# Patient Record
Sex: Male | Born: 1940
Health system: Southern US, Community
[De-identification: ages and names within clinical notes are randomized; demographics above are authoritative.]

## PROBLEM LIST (undated history)

## (undated) DIAGNOSIS — I739 Peripheral vascular disease, unspecified: Secondary | ICD-10-CM

## (undated) DIAGNOSIS — J449 Chronic obstructive pulmonary disease, unspecified: Secondary | ICD-10-CM

## (undated) DIAGNOSIS — I6529 Occlusion and stenosis of unspecified carotid artery: Secondary | ICD-10-CM

## (undated) DIAGNOSIS — I639 Cerebral infarction, unspecified: Secondary | ICD-10-CM

## (undated) DIAGNOSIS — I10 Essential (primary) hypertension: Secondary | ICD-10-CM

## (undated) HISTORY — DX: Chronic obstructive pulmonary disease, unspecified: J44.9

## (undated) HISTORY — DX: Cerebral infarction, unspecified: I63.9

## (undated) HISTORY — DX: Occlusion and stenosis of unspecified carotid artery: I65.29

## (undated) HISTORY — DX: Peripheral vascular disease, unspecified: I73.9

---

## 2003-02-19 ENCOUNTER — Ambulatory Visit (HOSPITAL_COMMUNITY): Admission: RE | Admit: 2003-02-19 | Discharge: 2003-02-19 | Payer: Self-pay | Admitting: Internal Medicine

## 2004-02-23 ENCOUNTER — Ambulatory Visit (HOSPITAL_COMMUNITY): Admission: RE | Admit: 2004-02-23 | Discharge: 2004-02-23 | Payer: Self-pay | Admitting: Internal Medicine

## 2005-02-25 ENCOUNTER — Ambulatory Visit (HOSPITAL_COMMUNITY): Admission: RE | Admit: 2005-02-25 | Discharge: 2005-02-25 | Payer: Self-pay | Admitting: Internal Medicine

## 2005-02-25 ENCOUNTER — Encounter: Admission: RE | Admit: 2005-02-25 | Discharge: 2005-02-25 | Payer: Self-pay | Admitting: Internal Medicine

## 2007-02-21 ENCOUNTER — Emergency Department (HOSPITAL_COMMUNITY): Admission: EM | Admit: 2007-02-21 | Discharge: 2007-02-21 | Payer: Self-pay | Admitting: Emergency Medicine

## 2007-02-23 ENCOUNTER — Emergency Department (HOSPITAL_COMMUNITY): Admission: EM | Admit: 2007-02-23 | Discharge: 2007-02-23 | Payer: Self-pay | Admitting: Emergency Medicine

## 2007-02-24 ENCOUNTER — Emergency Department (HOSPITAL_COMMUNITY): Admission: EM | Admit: 2007-02-24 | Discharge: 2007-02-24 | Payer: Self-pay | Admitting: Emergency Medicine

## 2007-03-12 ENCOUNTER — Ambulatory Visit (HOSPITAL_COMMUNITY): Admission: RE | Admit: 2007-03-12 | Discharge: 2007-03-12 | Payer: Self-pay | Admitting: Internal Medicine

## 2008-03-25 ENCOUNTER — Encounter: Payer: Self-pay | Admitting: Gastroenterology

## 2008-03-25 ENCOUNTER — Encounter (INDEPENDENT_AMBULATORY_CARE_PROVIDER_SITE_OTHER): Payer: Self-pay | Admitting: *Deleted

## 2008-04-20 ENCOUNTER — Ambulatory Visit: Payer: Self-pay | Admitting: Gastroenterology

## 2008-04-20 DIAGNOSIS — K921 Melena: Secondary | ICD-10-CM | POA: Insufficient documentation

## 2008-04-21 ENCOUNTER — Encounter: Payer: Self-pay | Admitting: Gastroenterology

## 2008-04-23 ENCOUNTER — Encounter: Payer: Self-pay | Admitting: Gastroenterology

## 2008-04-23 ENCOUNTER — Ambulatory Visit: Payer: Self-pay | Admitting: Gastroenterology

## 2008-04-23 ENCOUNTER — Ambulatory Visit (HOSPITAL_COMMUNITY): Admission: RE | Admit: 2008-04-23 | Discharge: 2008-04-23 | Payer: Self-pay | Admitting: Gastroenterology

## 2008-05-05 ENCOUNTER — Encounter: Payer: Self-pay | Admitting: Gastroenterology

## 2010-06-07 NOTE — Op Note (Signed)
Jerry Rodriguez, Jerry Rodriguez              ACCOUNT NO.:  [id number]   MEDICAL RECORD NO.:  [id number]          PATIENT TYPE:  AMB   LOCATION:  DAY                           FACILITY:  [facility]   PHYSICIAN:  Kassie Mends, M.D.      DATE OF BIRTH:  04/14/1940   DATE OF PROCEDURE:  04/23/2008  DATE OF DISCHARGE:                               OPERATIVE REPORT   REFERRING Aidenn Skellenger:  Kingsley Callander. Ouida Sills, MD   PROCEDURE:  Ileal colonoscopy with removal of foreign body and cold  forceps/snare cautery polypectomy.   INDICATION FOR EXAMINATION:  Mr. Casale is a 70 year old male who  presents with heme-positive stool.   He uses Advil every 3 weeks.   FINDINGS:  1. Normal terminal ileum, approximately 5 cm visualized.  2. Foreign body seen in the cecum.  Mr. Britten swallowed a soda tab      in December 2009.  The soda tab was grasped with a [employer].  The      scope was withdrawn into the rectum.  The position of the tab      prevented removal and the tab was released into the rectum.  The      hood was deployed over the scope.  The scope was reinserted.  The      soda tab was secured in a different orientation.  Its exit through      the anus was longitudinal and not horizontal.  The hood did not      deploy.  No trauma to the anal canal was seen.  3. Ten colon polyps removed via cold forceps and snare cautery      polypectomy.  The polyps ranged in size from 3 mm to 8 mm.  The      polyps were located throughout the transverse colon and a single      polyp was seen in the sigmoid colon.  The sigmoid colon polyp was      sent in a separate container.  Otherwise, no evidence of masses,      inflammatory changes, diverticular AVMs.  4. Small internal hemorrhoids.  Otherwise, normal retroflexed view of      the rectum.   DIAGNOSES:  1. Foreign body in the cecum, status post removal.  2. Multiple colon polyps, likely the source of his heme-positive      stools.  3. Small internal hemorrhoids.   RECOMMENDATIONS:  1. Screening colonoscopy in 5 years due to the number of polyps      removed.  2. We will call Mr. Schoffstall with the results of his biopsies.  3. No aspirin, NSAIDs, or anticoagulation for 7 days.  4. He should follow a high-fiber diet.  He is given a handout on high-      fiber diet, polyps, and hemorrhoids.   MEDICATIONS:  1. Demerol 50 mg IV.  2. Versed 4 mg IV.   PROCEDURE TECHNIQUE:  Physical exam was performed.  Informed consent was  obtained from the patient after explaining the benefits, risks, and  alternatives to the procedure.  The patient was connected to monitor  and  placed in left lateral position.  Continuous oxygen was provided by  nasal cannula.  IV medicine administered through an indwelling cannula.  After administration of sedation and rectal exam, the patient's rectum  was intubated.  The scope was advanced under direct visualization to the  distal terminal ileum.  In the cecum, there was a foreign body.  The  foreign body was removed via [employer].  After the foreign body was  removed, the rectum again was intubated with the adult colonoscope.  The  scope was advanced under direct visualization to the ascending colon.  The polyps were removed from the transverse colon and the sigmoid colon.  The scope was removed slowly by carefully examining the color, texture,  anatomy and integrity of the mucosa on the way out.  The patient was  recovered in endoscopy and discharged home in satisfactory condition.   PATH:  Simple adenomas and hyperplastic polyps.      Kassie Mends, M.D.  Electronically Signed     SM/MEDQ  D:  04/23/2008  T:  04/23/2008  Job:  [id number]   cc:   Kingsley Callander. Ouida Sills, MD  Fax: [phone]

## 2010-09-16 ENCOUNTER — Encounter: Payer: Self-pay | Admitting: *Deleted

## 2010-09-16 ENCOUNTER — Other Ambulatory Visit: Payer: Self-pay

## 2010-09-16 ENCOUNTER — Emergency Department (HOSPITAL_COMMUNITY): Payer: Medicare Other

## 2010-09-16 ENCOUNTER — Inpatient Hospital Stay (HOSPITAL_COMMUNITY): Payer: Medicare Other

## 2010-09-16 ENCOUNTER — Inpatient Hospital Stay (HOSPITAL_COMMUNITY)
Admission: EM | Admit: 2010-09-16 | Discharge: 2010-09-18 | DRG: 066 | Disposition: A | Discharge status: Home / Self Care | Payer: Medicare Other | Attending: Internal Medicine | Admitting: Internal Medicine

## 2010-09-16 DIAGNOSIS — I6350 Cerebral infarction due to unspecified occlusion or stenosis of unspecified cerebral artery: Principal | ICD-10-CM | POA: Diagnosis present

## 2010-09-16 DIAGNOSIS — E785 Hyperlipidemia, unspecified: Secondary | ICD-10-CM | POA: Diagnosis present

## 2010-09-16 DIAGNOSIS — R4789 Other speech disturbances: Secondary | ICD-10-CM | POA: Diagnosis present

## 2010-09-16 DIAGNOSIS — F17200 Nicotine dependence, unspecified, uncomplicated: Secondary | ICD-10-CM | POA: Diagnosis present

## 2010-09-16 DIAGNOSIS — J4489 Other specified chronic obstructive pulmonary disease: Secondary | ICD-10-CM | POA: Diagnosis present

## 2010-09-16 DIAGNOSIS — G459 Transient cerebral ischemic attack, unspecified: Secondary | ICD-10-CM

## 2010-09-16 DIAGNOSIS — I10 Essential (primary) hypertension: Secondary | ICD-10-CM | POA: Diagnosis present

## 2010-09-16 DIAGNOSIS — J449 Chronic obstructive pulmonary disease, unspecified: Secondary | ICD-10-CM | POA: Diagnosis present

## 2010-09-16 HISTORY — DX: Essential (primary) hypertension: I10

## 2010-09-16 LAB — URINALYSIS, ROUTINE W REFLEX MICROSCOPIC
Bilirubin Urine: NEGATIVE
Glucose, UA: NEGATIVE mg/dL
Protein, ur: NEGATIVE mg/dL
Specific Gravity, Urine: <1.005 — ABNORMAL LOW (ref 1.005–1.030)
Urobilinogen, UA: 0.2 mg/dL (ref 0.0–1.0)

## 2010-09-16 LAB — DIFFERENTIAL
Lymphocytes Relative: 24 % (ref 12–46)
Lymphs Abs: 2 10*3/uL (ref 0.7–4.0)
Monocytes Relative: 9 % (ref 3–12)
Neutro Abs: 5.4 10*3/uL (ref 1.7–7.7)
Neutrophils Relative %: 65 % (ref 43–77)

## 2010-09-16 LAB — COMPREHENSIVE METABOLIC PANEL
AST: 15 U/L (ref 0–37)
Albumin: 3.9 g/dL (ref 3.5–5.2)
CO2: 27 mEq/L (ref 19–32)
Chloride: 106 mEq/L (ref 96–112)
Glucose, Bld: 118 mg/dL — ABNORMAL HIGH (ref 70–99)
Potassium: 4 mEq/L (ref 3.5–5.1)
Sodium: 140 mEq/L (ref 135–145)

## 2010-09-16 LAB — GLUCOSE, CAPILLARY: Glucose-Capillary: 107 mg/dL — ABNORMAL HIGH (ref 70–99)

## 2010-09-16 LAB — CBC
Hemoglobin: 15.9 g/dL (ref 13.0–17.0)
Platelets: 229 10*3/uL (ref 150–400)
RBC: 4.74 MIL/uL (ref 4.22–5.81)
WBC: 8.4 10*3/uL (ref 4.0–10.5)

## 2010-09-16 LAB — CARDIAC PANEL(CRET KIN+CKTOT+MB+TROPI)
CK, MB: 2.6 ng/mL (ref 0.3–4.0)
Total CK: 109 U/L (ref 7–232)
Troponin I: <0.3 ng/mL (ref <0.30)

## 2010-09-16 LAB — URINE MICROSCOPIC-ADD ON

## 2010-09-16 MED ORDER — SODIUM CHLORIDE 0.9 % IJ SOLN
3.0000 mL | Freq: Two times a day (BID) | INTRAMUSCULAR | Status: DC
  Administered 2010-09-16 – 2010-09-17 (×4): 3 mL via INTRAVENOUS
  Filled 2010-09-16 (×4): qty 3

## 2010-09-16 MED ORDER — SODIUM CHLORIDE 0.9 % IV BOLUS (SEPSIS)
500.0000 mL | Freq: Once | INTRAVENOUS | Status: AC
  Administered 2010-09-16: 500 mL via INTRAVENOUS

## 2010-09-16 MED ORDER — CLOPIDOGREL BISULFATE 75 MG PO TABS
75.0000 mg | ORAL_TABLET | Freq: Every day | ORAL | Status: DC
  Administered 2010-09-17 – 2010-09-18 (×2): 75 mg via ORAL
  Filled 2010-09-16 (×2): qty 1

## 2010-09-16 MED ORDER — ASPIRIN 81 MG PO CHEW
324.0000 mg | CHEWABLE_TABLET | Freq: Once | ORAL | Status: AC
  Administered 2010-09-16: 324 mg via ORAL
  Filled 2010-09-16: qty 4

## 2010-09-16 MED ORDER — AMLODIPINE BESYLATE 5 MG PO TABS
5.0000 mg | ORAL_TABLET | Freq: Every day | ORAL | Status: DC
  Administered 2010-09-16 – 2010-09-17 (×2): 5 mg via ORAL
  Filled 2010-09-16 (×2): qty 1

## 2010-09-16 MED ORDER — ASPIRIN 81 MG PO CHEW
CHEWABLE_TABLET | ORAL | Status: AC
  Filled 2010-09-16: qty 2

## 2010-09-16 MED ORDER — SODIUM CHLORIDE 0.9 % IV SOLN: Freq: Once | INTRAVENOUS | Status: DC

## 2010-09-16 MED ORDER — ASPIRIN 81 MG PO CHEW
81.0000 mg | CHEWABLE_TABLET | Freq: Every day | ORAL | Status: DC
  Administered 2010-09-16 – 2010-09-17 (×2): 81 mg via ORAL
  Filled 2010-09-16 (×2): qty 1

## 2010-09-16 NOTE — ED Notes (Signed)
Pt has had slurred speech, problems using his fingers and AMS this am since waking up. Was last seen normal at 2100 last night. Family states that he was unable to button his shirt this am and went out of the house with one shoe on this morning. Pt very hard of hearing.

## 2010-09-16 NOTE — ED Notes (Signed)
Floor unable to take report at this time.

## 2010-09-16 NOTE — ED Notes (Signed)
Pt getting off the end of the bed, insisting on getting unhooked to go to the bathroom.  A urinal was offered.  Instead chose to go to the bathroom.  Ambulated without difficulty.

## 2010-09-16 NOTE — ED Notes (Signed)
Family and pt. aware needing urine sample.  Advised pt and family stool is very unstable.

## 2010-09-16 NOTE — ED Notes (Signed)
Urine obtained.

## 2010-09-16 NOTE — ED Provider Notes (Signed)
Scribed for Jerry Baker, MD, the patient was seen in room [facility] . This chart was scribed by Ellie Lunch. This patient's care was started at 7:53 AM.   CSN: [id number] Arrival date & time: 09/16/2010  6:57 AM  Chief Complaint  Patient presents with  . Stroke Symptoms   Patient is a 70 y.o. male presenting with altered mental status. The history is provided by the patient and the spouse.  Altered Mental Status This is a new problem. The current episode started 1 to 2 hours ago. The problem has been resolved. Pertinent negatives include no chest pain, no abdominal pain and no shortness of breath.   Jerry Rodriguez is a 70 y.o. male who presents to the Emergency Department complaining of altered mental status. Wife reports patient woke up this morning with confused behavior. Patient wet himself, had slurred and incoherent speech, and was unable to button shirt or pants. Wife reports that symptoms improved the longer patient was awake. Patient reportedly did not have facial drooping. Wife also asked him to squeeze her hands and reports his grip strength was strong. Now patient has returned to baseline. Patient denies chest, back or abdominal pain, SOB, n/v/d, or fever.    Past Medical History  Diagnosis Date  . Hypertension     History reviewed. No pertinent past surgical history.  History reviewed. No pertinent family history.  History  Substance Use Topics  . Smoking status: Current Everyday Smoker -- 1.0 packs/day  . Smokeless tobacco: Not on file  . Alcohol Use: No     Review of Systems  Constitutional: Positive for activity change. Negative for fever.  Respiratory: Negative for shortness of breath.   Cardiovascular: Negative for chest pain.  Gastrointestinal: Negative for nausea, vomiting, abdominal pain and diarrhea.  Neurological: Positive for speech difficulty. Negative for facial asymmetry and weakness.  Psychiatric/Behavioral: Positive for confusion and altered  mental status.  All other systems reviewed and are negative.    Physical Exam  BP 130/78  Pulse 91  Temp(Src) 97.8 F (36.6 C) (Oral)  Resp 18  Ht 6' (1.829 m)  Wt 180 lb (81.647 kg)  BMI 24.41 kg/m2  SpO2 98%  Physical Exam  Nursing note and vitals reviewed. Constitutional: He is oriented to person, place, and time. He appears well-developed and well-nourished.  Eyes: EOM are normal. Pupils are equal, round, and reactive to light. No scleral icterus.  Neck: Neck supple.  Cardiovascular: Normal rate, regular rhythm and normal heart sounds.   Pulmonary/Chest: Effort normal and breath sounds normal. No respiratory distress.  Abdominal: Soft. There is no tenderness.  Musculoskeletal: Normal range of motion. He exhibits no tenderness.  Neurological: He is alert and oriented to person, place, and time. No cranial nerve deficit. Coordination normal.       Speech normal  Skin: Skin is warm and dry.    OTHER DATA REVIEWED: Nursing notes, vital signs, and past medical records reviewed.   DIAGNOSTIC STUDIES: Oxygen Saturation is 98% on room air, normal by my interpretation.    LABS / RADIOLOGY:  Results for orders placed during the hospital encounter of 09/16/10  CBC      Component Value Range   WBC 8.4  4.0 - 10.5 (K/uL)   RBC 4.74  4.22 - 5.81 (MIL/uL)   Hemoglobin 15.9  13.0 - 17.0 (g/dL)   HCT 91.4  78.2 - 95.6 (%)   MCV 97.5  78.0 - 100.0 (fL)   MCH 33.5  26.0 - 34.0 (  pg)   MCHC 34.4  30.0 - 36.0 (g/dL)   RDW 65.7  84.6 - 96.2 (%)   Platelets 229  150 - 400 (K/uL)  DIFFERENTIAL      Component Value Range   Neutrophils Relative 65  43 - 77 (%)   Neutro Abs 5.4  1.7 - 7.7 (K/uL)   Lymphocytes Relative 24  12 - 46 (%)   Lymphs Abs 2.0  0.7 - 4.0 (K/uL)   Monocytes Relative 9  3 - 12 (%)   Monocytes Absolute 0.7  0.1 - 1.0 (K/uL)   Eosinophils Relative 2  0 - 5 (%)   Eosinophils Absolute 0.2  0.0 - 0.7 (K/uL)   Basophils Relative 1  0 - 1 (%)   Basophils Absolute  0.0  0.0 - 0.1 (K/uL)  COMPREHENSIVE METABOLIC PANEL      Component Value Range   Sodium 140  135 - 145 (mEq/L)   Potassium 4.0  3.5 - 5.1 (mEq/L)   Chloride 106  96 - 112 (mEq/L)   CO2 27  19 - 32 (mEq/L)   Glucose, Bld 118 (*) 70 - 99 (mg/dL)   BUN 16  6 - 23 (mg/dL)   Creatinine, Ser 9.52  0.50 - 1.35 (mg/dL)   Calcium 9.7  8.4 - 84.1 (mg/dL)   Total Protein 7.0  6.0 - 8.3 (g/dL)   Albumin 3.9  3.5 - 5.2 (g/dL)   AST 15  0 - 37 (U/L)   ALT 12  0 - 53 (U/L)   Alkaline Phosphatase 100  39 - 117 (U/L)   Total Bilirubin 0.7  0.3 - 1.2 (mg/dL)   GFR calc non Af Amer 56 (*) >60 (mL/min)   GFR calc Af Amer >60  >60 (mL/min)  CARDIAC PANEL(CRET KIN+CKTOT+MB+TROPI)      Component Value Range   Total CK 109  7 - 232 (U/L)   CK, MB 2.6  0.3 - 4.0 (ng/mL)   Troponin I <0.30  <0.30 (ng/mL)   Relative Index 2.4  0.0 - 2.5   GLUCOSE, CAPILLARY      Component Value Range   Glucose-Capillary 107 (*) 70 - 99 (mg/dL)   Comment 1 Documented in Chart     Comment 2 Notify RN    URINALYSIS, ROUTINE W REFLEX MICROSCOPIC      Component Value Range   Color, Urine YELLOW  YELLOW    Appearance CLEAR  CLEAR    Specific Gravity, Urine <1.005 (*) 1.005 - 1.030    pH 6.5  5.0 - 8.0    Glucose, UA NEGATIVE  NEGATIVE (mg/dL)   Hgb urine dipstick SMALL (*) NEGATIVE    Bilirubin Urine NEGATIVE  NEGATIVE    Ketones, ur NEGATIVE  NEGATIVE (mg/dL)   Protein, ur NEGATIVE  NEGATIVE (mg/dL)   Urobilinogen, UA 0.2  0.0 - 1.0 (mg/dL)   Nitrite NEGATIVE  NEGATIVE    Leukocytes, UA NEGATIVE  NEGATIVE   URINE MICROSCOPIC-ADD ON      Component Value Range   WBC, UA 0-2  <3 (WBC/hpf)   RBC / HPF 0-2  <3 (RBC/hpf)   Ct Head Wo Contrast  09/16/2010  *RADIOLOGY REPORT*  Clinical Data: Mental status change.  Possible stroke.  CT HEAD WITHOUT CONTRAST  Technique:  Contiguous axial images were obtained from the base of the skull through the vertex without contrast.  Comparison: None.  Findings: There is mild  encephalomalacia posteriorly in the right parietal lobe. There is no evidence of acute  intracranial hemorrhage, mass lesion, brain edema or extra-axial fluid collection.  The ventricles and subarachnoid spaces are appropriately sized for age.  There is no CT evidence of acute cortical infarction.  The visualized paranasal sinuses are clear. The calvarium is intact.  IMPRESSION: Posterior right parietal encephalomalacia consistent with old subcortical stroke.  No acute intracranial findings demonstrated.  Original Report Authenticated By: Gerrianne Scale, M.D.    MDM:   10:08 AM Labs and xrays reviewed, suspect TIA as possible cause of current sx, spoke with dr. Ouida Sills, will admit to tele   MEDICATIONS GIVEN IN THE E.D.  Medications  sodium chloride 0.9 % bolus 500 mL (500 mL Intravenous Given 09/16/10 0730)  aspirin chewable tablet 324 mg (324 mg Oral Given 09/16/10 0845)    Procedures   I personally performed the services described in this documentation, which was scribed in my presence. The recorded information has been reviewed and considered. Jerry Baker, MD    Jerry Baker, MD 09/28/10 [phone]

## 2010-09-17 MED ORDER — RAMIPRIL 2.5 MG PO CAPS
2.5000 mg | ORAL_CAPSULE | Freq: Every day | ORAL | Status: DC
  Administered 2010-09-17: 2.5 mg via ORAL
  Filled 2010-09-17: qty 1

## 2010-09-17 MED ORDER — ROSUVASTATIN CALCIUM 20 MG PO TABS
20.0000 mg | ORAL_TABLET | Freq: Every day | ORAL | Status: DC
  Administered 2010-09-17: 20 mg via ORAL
  Filled 2010-09-17: qty 1

## 2010-09-17 NOTE — Progress Notes (Signed)
Jerry Rodriguez, Jerry Rodriguez              ACCOUNT NO.:  [id number]  MEDICAL RECORD NO.:  [id number]  LOCATION:  A320                          FACILITY:  [facility]  PHYSICIAN:  Kingsley Callander. Ouida Sills, MD       DATE OF BIRTH:  [birth year]  DATE OF PROCEDURE:  09/17/2010 DATE OF DISCHARGE:                                PROGRESS NOTE   Jerry Rodriguez has had no neurologic complaints overnight.  His speech has been at baseline.  His strength and coordination have returned to normal.  PHYSICAL EXAMINATION:  VITAL SIGNS:  Blood pressures have range from 123/74 to 150/89, temperature 97.7, pulse 68, respirations 19, oxygen saturation 95% on room air. LUNGS:  Lungs sounds are diminished, but clear. CARDIAC:  Heart rate is regular with no murmurs. ABDOMEN:  Nondistended. NEUROLOGIC:  Status is back to baseline and is grossly intact.  No slurred speech.  IMPRESSION/PLAN:  Stroke.  His MRI of the brain revealed an acute moderate-sized nonhemorrhagic infarct in the right temporoparietal region.  His MRA of the head revealed an occluded right internal carotid artery.  His carotid ultrasound likewise revealed a completely occluded right carotid artery with a 50-69% stenosis in the left internal carotid artery.  He would be treated with Plavix and aspirin.  His antihypertensive therapy will be modified to ramipril.  Atorvastatin will be started.  He would be observed further and monitored setting today.  Thankfully, his neurologic status has returned to baseline at this point.  He has been counseled regarding the dire need for abstinence from tobacco.     Kingsley Callander. Ouida Sills, MD     ROF/MEDQ  D:  09/17/2010  T:  09/17/2010  Job:  [id number]

## 2010-09-17 NOTE — H&P (Signed)
Jerry Rodriguez, ROCKERS              ACCOUNT NO.:  [id number]  MEDICAL RECORD NO.:  [id number]  LOCATION:  A320                          FACILITY:  [facility]  PHYSICIAN:  Kingsley Callander. Ouida Sills, MD       DATE OF BIRTH:  February 05, 1940  DATE OF ADMISSION:  09/16/2010 DATE OF DISCHARGE:  LH                             HISTORY & PHYSICAL   CHIEF COMPLAINT:  Slurred speech.  HISTORY OF PRESENT ILLNESS:  This patient is a 70 year old white male who presented to the emergency room after awakening this morning with slurred speech.  His wife describes his speech has been extremely difficult to understand.  He had difficulty with coordination, such as with buttoning his shirt and preparing coffee in the kitchen.  He had been in his usual state of health 1 day prior to admission.  Over the next couple of hours, his symptoms resolved.  He was evaluated in the emergency room where his symptoms had resolved.  He had a CT scan of the brain, which revealed no evidence of acute stroke.  The patient is a smoker.  He has a history of hypertension treated with amlodipine.  He has a history of mild hyperlipidemia.  His cholesterol was 206 with an HDL of 34 and LDL of 144 in March of this year.  He does not have diabetes.  PAST MEDICAL HISTORY: 1. Hypertension. 2. Hyperlipidemia. 3. Umbilical hernia. 4. Hearing loss. 5. Colon adenomas. 6. Cervical spine fracture at 3.  MEDICATIONS:  Amlodipine 5 mg daily.  ALLERGIES:  None.  FAMILY HISTORY:  His father died at 76 of hardening of the arteries. His mother died at 68 of a brain tumor.  His sister has had a brain aneurysm.  SOCIAL HISTORY:  He smokes a pack of cigarettes, approximately every 2 days.  He drinks less than 2 drinks per month.  He does not use drugs. He is an active cattleman.  REVIEW OF SYSTEMS:  Noncontributory.  PHYSICAL EXAMINATION:  VITAL SIGNS:  Temperature 98.3 pole 69, respirations 20, blood pressure 123/74, oxygen saturation 95%. GENERAL:   Alert and oriented, comfortable. HEENT:  Eyes, nose, and oropharynx are unremarkable.  His face is symmetric.  His speech is intact. NECK:  No JVD, thyromegaly, or carotid bruit. LUNGS:  Clear. HEART:  Regular with no murmurs. ABDOMEN:  Nontender with no hepatosplenomegaly.  He has a reducible umbilical hernia. EXTREMITIES:  Normal pulses with no clubbing or edema. NEURO:  Normal strength in the upper and lower extremities.  Normal facial muscle strength and expression.  Speech is normal.  Plantar responses are withdrawal.  LABORATORY DATA:  White count 8.4, hemoglobin 15.9, platelets 229,000. Sodium 140, potassium 4.0, bicarb 16, creatinine 1.27, calcium 9.7, troponin I less than 0.3, glucose 107.  Urinalysis reveals no protein. The CT scan of the head reveals a posterior right parietal area of encephalomalacia consistent with old subcortical stroke, but no acute intracranial abnormality.  IMPRESSION/PLAN: 1. Transient ischemic attack versus stroke.  He will be further     evaluated in a hospital setting.  He will have cardiac monitoring.     An MRI of the brain has well as an MRA of the  intracerebral vessels     and a carotid ultrasound will be obtained. 2. Hypertension. 3. Hyperlipidemia. 4. Smoker.     Kingsley Callander. Ouida Sills, MD     ROF/MEDQ  D:  09/16/2010  T:  09/17/2010  Job:  [id number]

## 2010-09-18 MED ORDER — ASPIRIN 81 MG PO CHEW: 81.0000 mg | CHEWABLE_TABLET | Freq: Every day | ORAL | Status: AC

## 2010-09-18 MED ORDER — ATORVASTATIN CALCIUM 20 MG PO TABS: 20.0000 mg | ORAL_TABLET | Freq: Every day | ORAL | Status: DC

## 2010-09-18 MED ORDER — CLOPIDOGREL BISULFATE 75 MG PO TABS: 75.0000 mg | ORAL_TABLET | Freq: Every day | ORAL | Status: AC

## 2010-09-18 MED ORDER — RAMIPRIL 2.5 MG PO CAPS: 5.0000 mg | ORAL_CAPSULE | Freq: Every day | ORAL | Status: DC

## 2010-09-18 NOTE — Plan of Care (Signed)
Problem: Discharge Progression Outcomes Goal: Other Discharge Outcomes/Goals Pt given quit smoking care notes.  Discharge instructions read to pt and her family They all verbalized understanding of all instructions. IV removed from rt [facility] cath tip intact. Discharged home with family

## 2010-09-18 NOTE — Discharge Summary (Signed)
Jerry Rodriguez, Jerry Rodriguez              ACCOUNT NO.:  [id number]  MEDICAL RECORD NO.:  [id number]  LOCATION:  A320                          FACILITY:  [facility]  PHYSICIAN:  Kingsley Callander. Ouida Sills, MD       DATE OF BIRTH:  July 14, 1940  DATE OF ADMISSION:  09/16/2010 DATE OF DISCHARGE:  08/26/2012LH                              DISCHARGE SUMMARY   DISCHARGE DIAGNOSES: 1. Right temporoparietal stroke. 2. Hypertension. 3. Hyperlipidemia. 4. Chronic obstructive pulmonary disease.  PROCEDURES:  MRI of the brain, MRA of the head, carotid ultrasound.  HOSPITAL COURSE:  This patient is a 70 year old white male with a history of hypertension and hyperlipidemia who presented after having a transient episode of difficulty speaking and difficulty with coordination.  A CT scan in the emergency room revealed evidence of an old posterior right parietal stroke with encephalomalacia present, but no evidence of acute infarct.  He was hospitalized and underwent an MRI of the brain which revealed an acute moderate-sized nonhemorrhagic infarct in the right temporoparietal region.  MRA of the head revealed occlusion of the right internal carotid artery at the skull base.  A carotid ultrasound revealed a completely occluded right carotid artery as well with a 50-69% stenosis in the left internal carotid.  His initial symptoms completely resolved.  His neurologic status returned to baseline.  His speech has been unchanged and his strength and coordination are unchanged.  He remained in sinus rhythm.  His hypertension had previously been treated with amlodipine, ramipril has been added, atorvastatin has been added and Plavix and aspirin have been added.  The patient has had no complications and is improved and stable for discharge on the morning of the 26th.  He will be seen in followup in my office in 1 week.  DISCHARGE MEDICATIONS: 1. Aspirin 81 mg daily. 2. Clopidogrel 75 mg daily. 3. Atorvastatin 20 mg  nightly. 4. Ramipril 5 mg nightly. 5. Amlodipine 5 mg nightly.  The patient has been counseled to discontinue smoking.     Kingsley Callander. Ouida Sills, MD     ROF/MEDQ  D:  09/18/2010  T:  09/18/2010  Job:  [id number]

## 2011-02-28 DIAGNOSIS — I6789 Other cerebrovascular disease: Secondary | ICD-10-CM | POA: Diagnosis not present

## 2011-06-26 DIAGNOSIS — Z79899 Other long term (current) drug therapy: Secondary | ICD-10-CM | POA: Diagnosis not present

## 2011-06-26 DIAGNOSIS — I10 Essential (primary) hypertension: Secondary | ICD-10-CM | POA: Diagnosis not present

## 2011-06-26 DIAGNOSIS — E785 Hyperlipidemia, unspecified: Secondary | ICD-10-CM | POA: Diagnosis not present

## 2011-06-26 DIAGNOSIS — I6789 Other cerebrovascular disease: Secondary | ICD-10-CM | POA: Diagnosis not present

## 2011-07-04 DIAGNOSIS — E785 Hyperlipidemia, unspecified: Secondary | ICD-10-CM | POA: Diagnosis not present

## 2011-07-04 DIAGNOSIS — Z1212 Encounter for screening for malignant neoplasm of rectum: Secondary | ICD-10-CM | POA: Diagnosis not present

## 2011-07-04 DIAGNOSIS — I6789 Other cerebrovascular disease: Secondary | ICD-10-CM | POA: Diagnosis not present

## 2011-07-04 DIAGNOSIS — I10 Essential (primary) hypertension: Secondary | ICD-10-CM | POA: Diagnosis not present

## 2011-12-28 DIAGNOSIS — H524 Presbyopia: Secondary | ICD-10-CM | POA: Diagnosis not present

## 2011-12-28 DIAGNOSIS — H52229 Regular astigmatism, unspecified eye: Secondary | ICD-10-CM | POA: Diagnosis not present

## 2011-12-28 DIAGNOSIS — H5200 Hypermetropia, unspecified eye: Secondary | ICD-10-CM | POA: Diagnosis not present

## 2011-12-28 DIAGNOSIS — H2589 Other age-related cataract: Secondary | ICD-10-CM | POA: Diagnosis not present

## 2012-01-03 ENCOUNTER — Other Ambulatory Visit (HOSPITAL_COMMUNITY): Payer: Self-pay | Admitting: Internal Medicine

## 2012-01-03 DIAGNOSIS — I639 Cerebral infarction, unspecified: Secondary | ICD-10-CM

## 2012-01-03 DIAGNOSIS — I10 Essential (primary) hypertension: Secondary | ICD-10-CM | POA: Diagnosis not present

## 2012-01-03 DIAGNOSIS — I2510 Atherosclerotic heart disease of native coronary artery without angina pectoris: Secondary | ICD-10-CM

## 2012-01-03 DIAGNOSIS — I6789 Other cerebrovascular disease: Secondary | ICD-10-CM | POA: Diagnosis not present

## 2012-01-03 DIAGNOSIS — Z23 Encounter for immunization: Secondary | ICD-10-CM | POA: Diagnosis not present

## 2012-01-04 ENCOUNTER — Ambulatory Visit (HOSPITAL_COMMUNITY)
Admission: RE | Admit: 2012-01-04 | Discharge: 2012-01-04 | Disposition: A | Discharge status: Home / Self Care | Payer: Medicare Other | Source: Ambulatory Visit | Attending: Internal Medicine | Admitting: Internal Medicine

## 2012-01-04 DIAGNOSIS — I10 Essential (primary) hypertension: Secondary | ICD-10-CM | POA: Insufficient documentation

## 2012-01-04 DIAGNOSIS — I6529 Occlusion and stenosis of unspecified carotid artery: Secondary | ICD-10-CM | POA: Insufficient documentation

## 2012-01-04 DIAGNOSIS — F17200 Nicotine dependence, unspecified, uncomplicated: Secondary | ICD-10-CM | POA: Insufficient documentation

## 2012-01-04 DIAGNOSIS — I639 Cerebral infarction, unspecified: Secondary | ICD-10-CM

## 2012-01-04 DIAGNOSIS — I658 Occlusion and stenosis of other precerebral arteries: Secondary | ICD-10-CM | POA: Diagnosis not present

## 2012-01-04 DIAGNOSIS — I2510 Atherosclerotic heart disease of native coronary artery without angina pectoris: Secondary | ICD-10-CM

## 2012-06-28 DIAGNOSIS — E785 Hyperlipidemia, unspecified: Secondary | ICD-10-CM | POA: Diagnosis not present

## 2012-06-28 DIAGNOSIS — Z79899 Other long term (current) drug therapy: Secondary | ICD-10-CM | POA: Diagnosis not present

## 2012-06-28 DIAGNOSIS — I6789 Other cerebrovascular disease: Secondary | ICD-10-CM | POA: Diagnosis not present

## 2012-06-28 DIAGNOSIS — I10 Essential (primary) hypertension: Secondary | ICD-10-CM | POA: Diagnosis not present

## 2012-07-08 DIAGNOSIS — Z79899 Other long term (current) drug therapy: Secondary | ICD-10-CM | POA: Diagnosis not present

## 2012-07-08 DIAGNOSIS — E785 Hyperlipidemia, unspecified: Secondary | ICD-10-CM | POA: Diagnosis not present

## 2012-07-08 DIAGNOSIS — I6789 Other cerebrovascular disease: Secondary | ICD-10-CM | POA: Diagnosis not present

## 2012-07-08 DIAGNOSIS — I10 Essential (primary) hypertension: Secondary | ICD-10-CM | POA: Diagnosis not present

## 2012-07-16 DIAGNOSIS — Z1212 Encounter for screening for malignant neoplasm of rectum: Secondary | ICD-10-CM | POA: Diagnosis not present

## 2012-07-16 DIAGNOSIS — E785 Hyperlipidemia, unspecified: Secondary | ICD-10-CM | POA: Diagnosis not present

## 2012-07-16 DIAGNOSIS — I6789 Other cerebrovascular disease: Secondary | ICD-10-CM | POA: Diagnosis not present

## 2012-07-16 DIAGNOSIS — I10 Essential (primary) hypertension: Secondary | ICD-10-CM | POA: Diagnosis not present

## 2012-10-16 IMAGING — CT CT HEAD W/O CM
1 series · 16 of 30 positions shown, 20 images · non-contrast
Comparison: None.

CLINICAL DATA: Mental status change.  Possible stroke.

CT HEAD WITHOUT CONTRAST
TECHNIQUE: Contiguous axial images were obtained from the base of
the skull through the vertex without contrast.

[Series 2: headseq 4.8 h37s · axial · 0.45mm/px · z∈[+92,+257]mm · 16 of 36 slices shown, 20 images]
[im 2/36  brain]
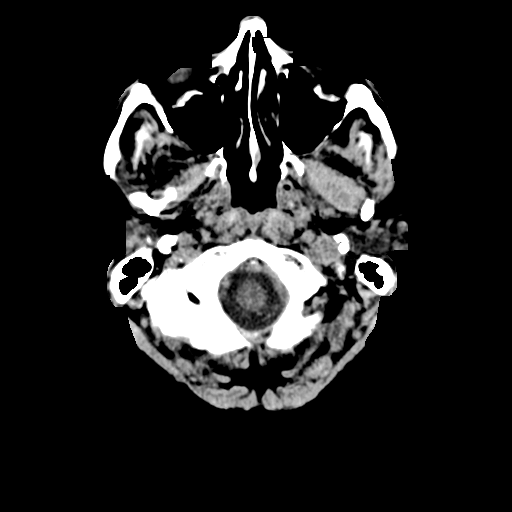
[im 2/36  bone]
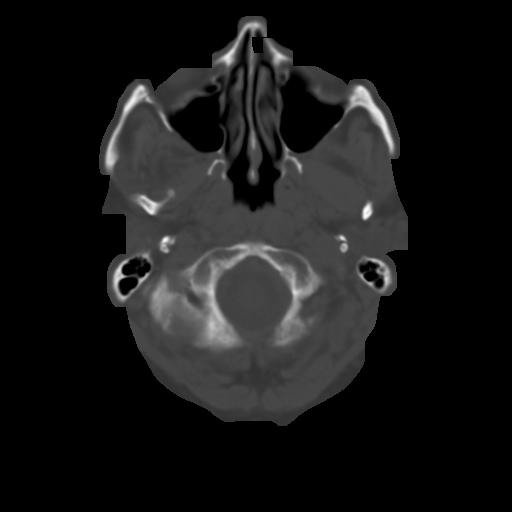
[im 4/36  brain]
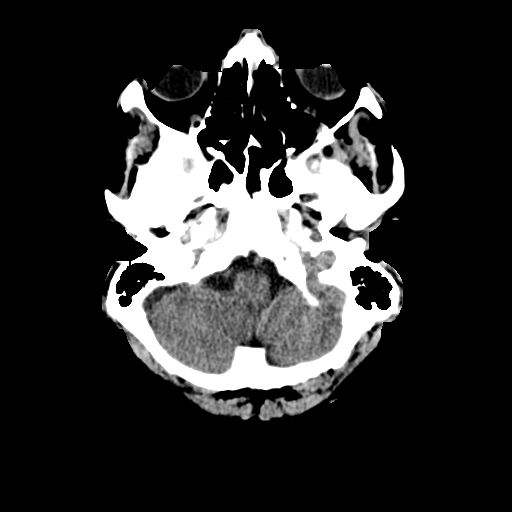
[im 7/36  brain]
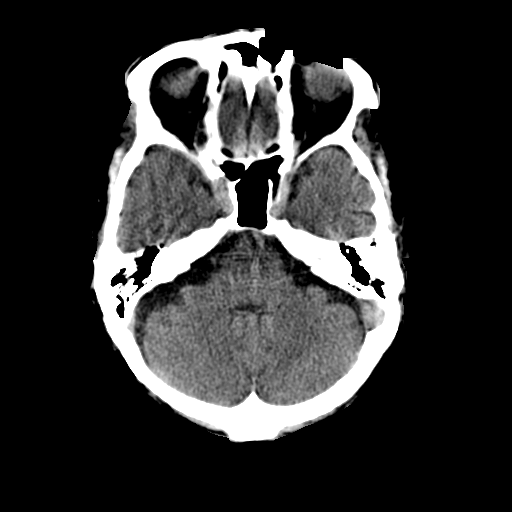
[im 9/36  brain]
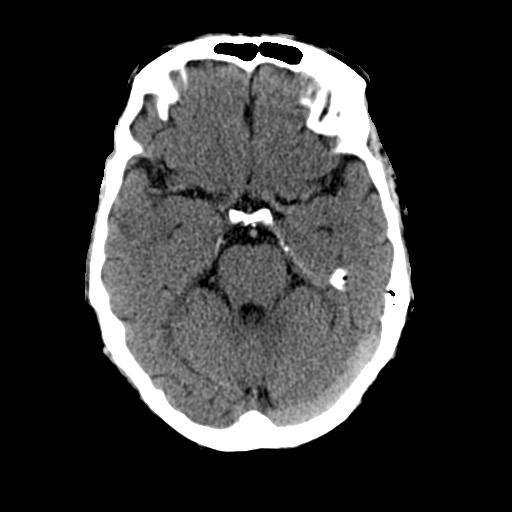
[im 10/36  brain]
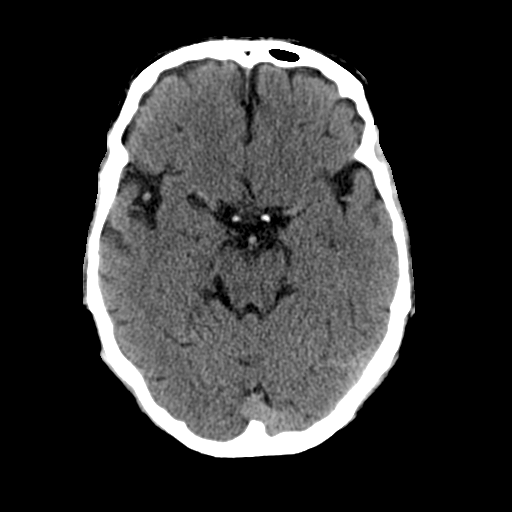
[im 10/36  bone]
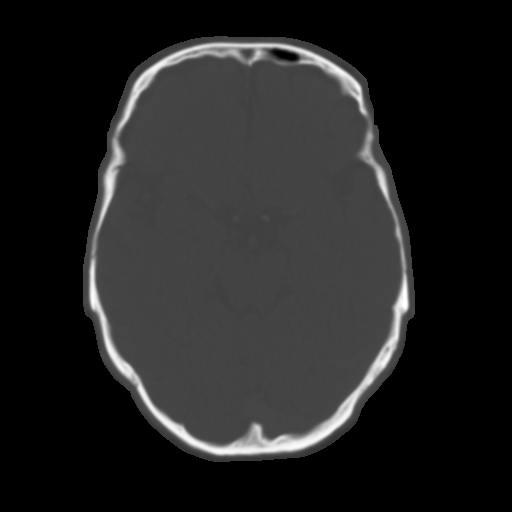
[im 13/36  brain]
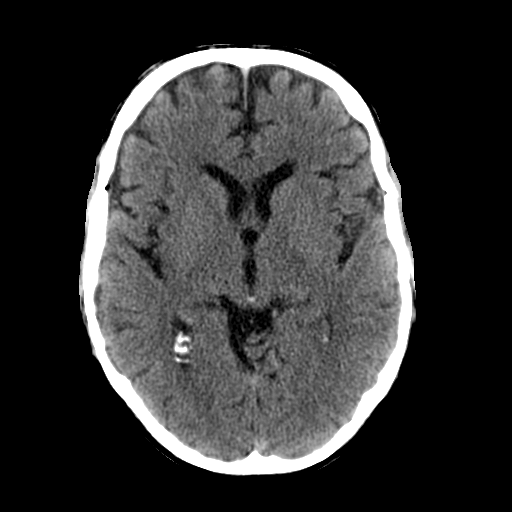
[im 15/36  brain]
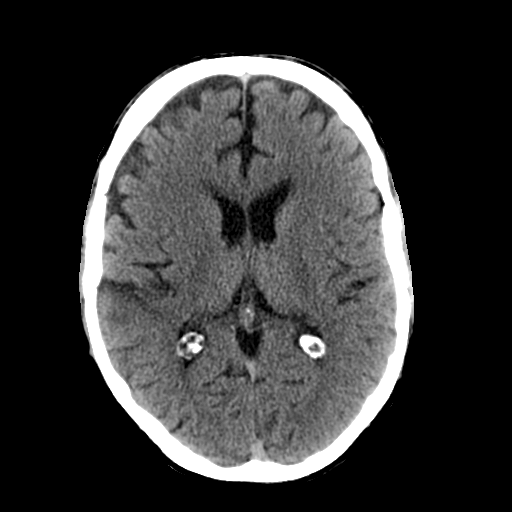
[im 17/36  brain]
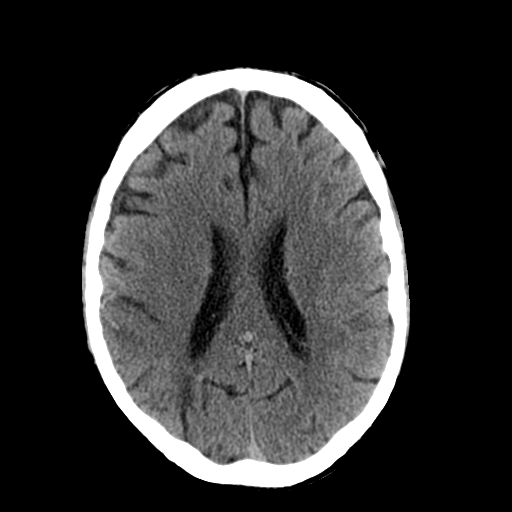
[im 19/36  brain]
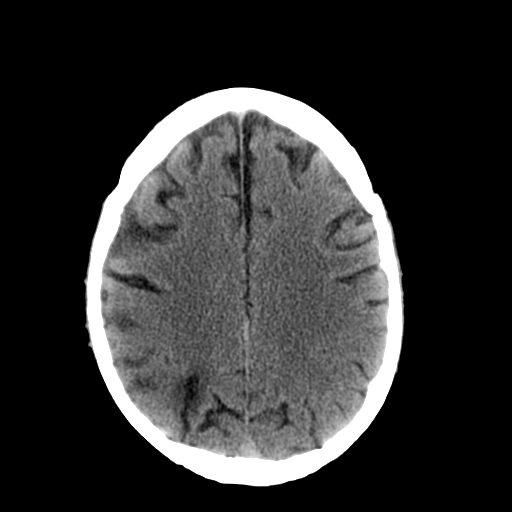
[im 19/36  bone]
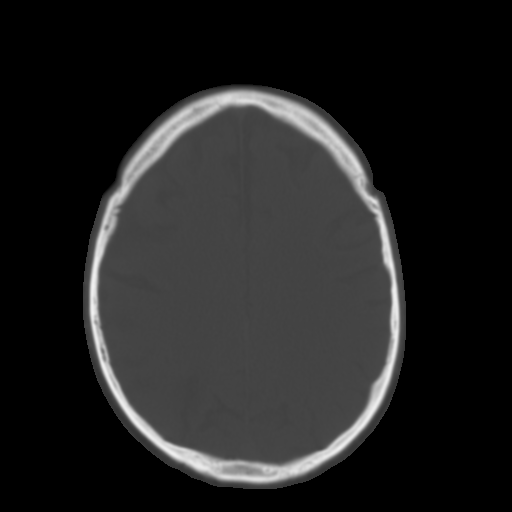
[im 21/36  brain]
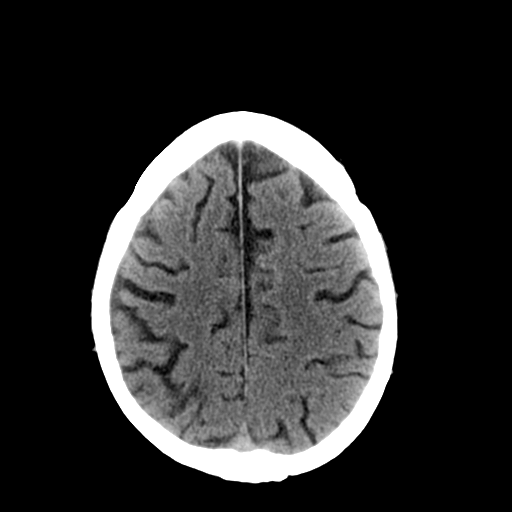
[im 23/36  brain]
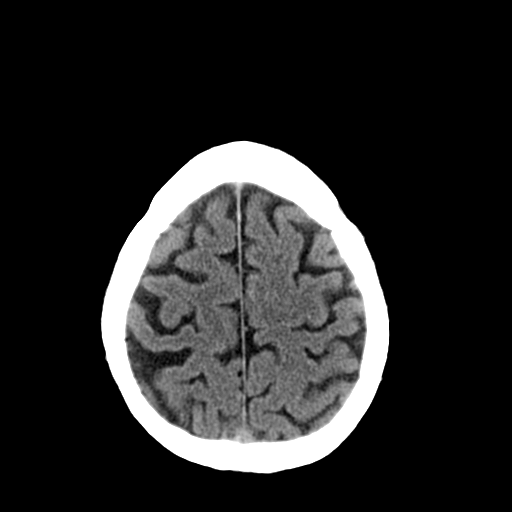
[im 26/36  brain]
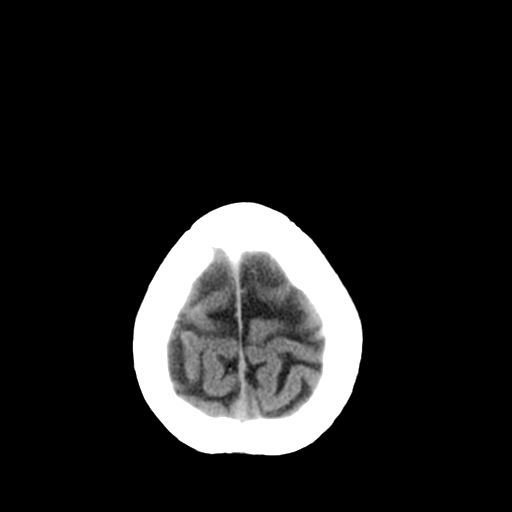
[im 27/36  brain]
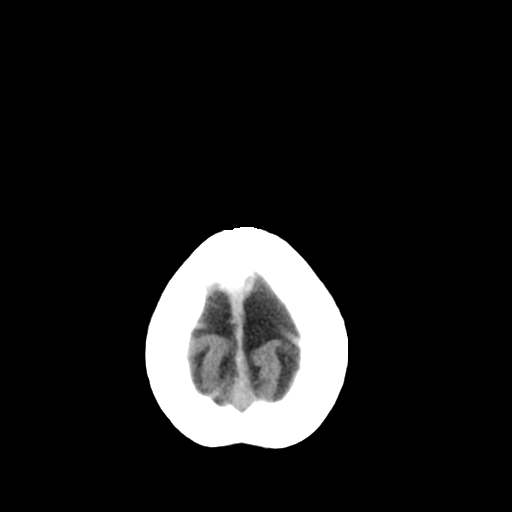
[im 27/36  bone]
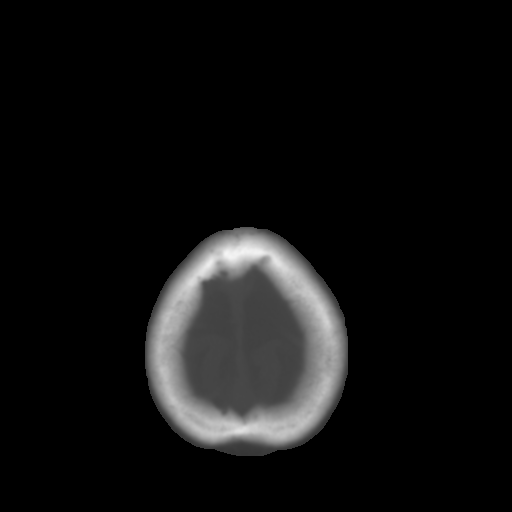
[im 29/36  brain]
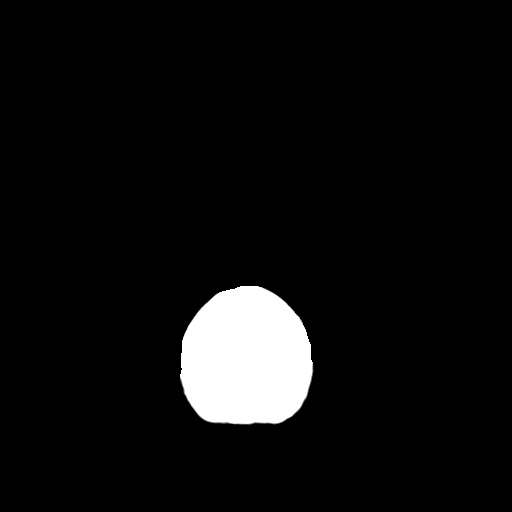
[im 32/36  brain]
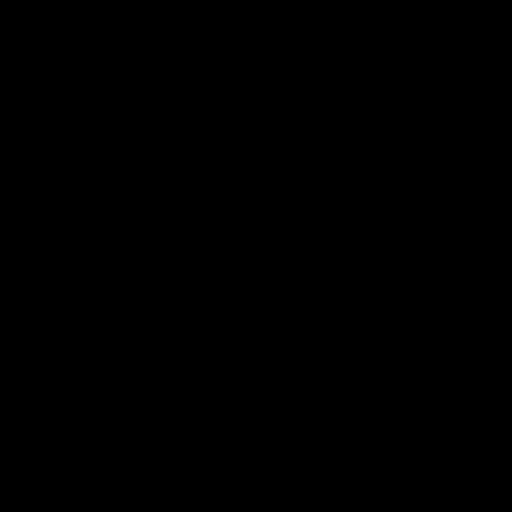
[im 34/36  brain]
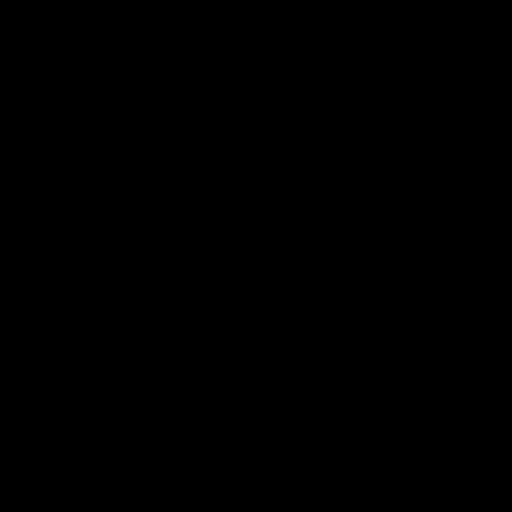

[16 of 30 positions shown; findings below may reference images not displayed]

FINDINGS: There is mild encephalomalacia posteriorly in the right
parietal lobe. There is no evidence of acute intracranial
hemorrhage, mass lesion, brain edema or extra-axial fluid
collection.  The ventricles and subarachnoid spaces are
appropriately sized for age.  There is no CT evidence of acute
cortical infarction.

The visualized paranasal sinuses are clear. The calvarium is
intact.
IMPRESSION: Posterior right parietal encephalomalacia consistent with old
subcortical stroke.  No acute intracranial findings demonstrated.

## 2012-10-16 IMAGING — US US CAROTID DUPLEX BILAT
1 series · 13 of 24 positions shown · non-contrast
Comparison: Brain MR obtained earlier today.

CLINICAL DATA: Right internal carotid artery occlusion on a brain
MRA earlier today.  Syncope.  Stroke.  Hypertension.  Smoker.

BILATERAL CAROTID DUPLEX ULTRASOUND
TECHNIQUE: Gray scale imaging, color Doppler and duplex ultrasound
was performed of bilateral carotid and vertebral arteries in the
neck.

[Series 1: us carotid duplex bilat · 0.05mm/px · 13 of 73 slices shown]
[im 1/73]
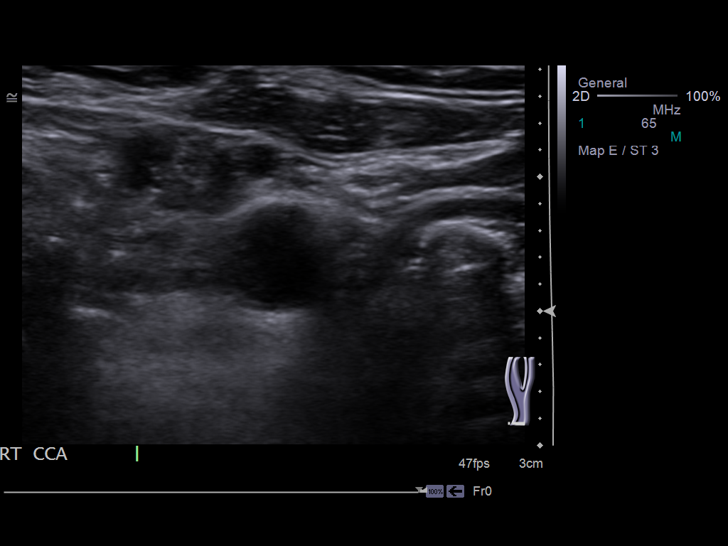
[im 7/73]
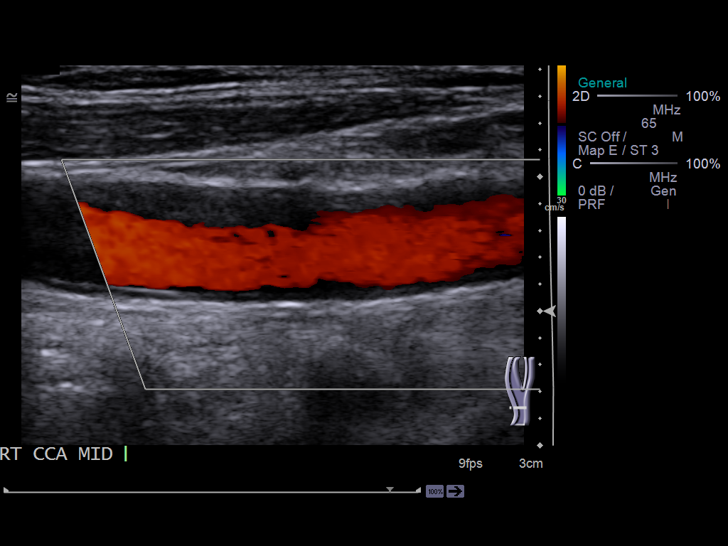
[im 13/73]
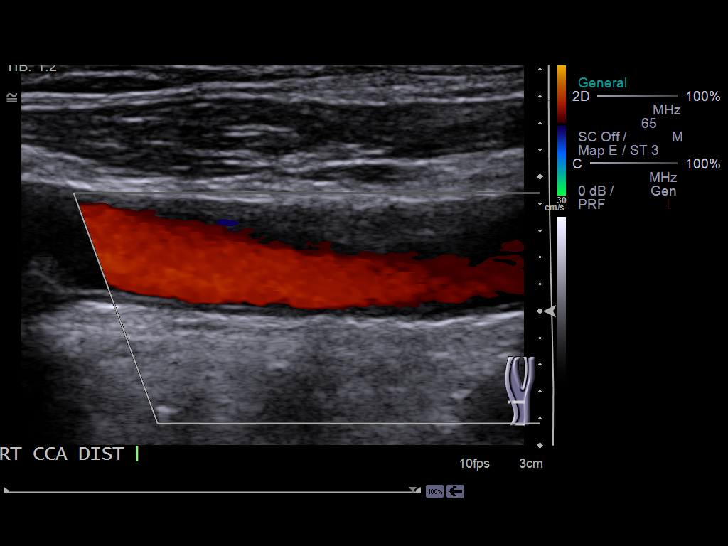
[im 19/73]
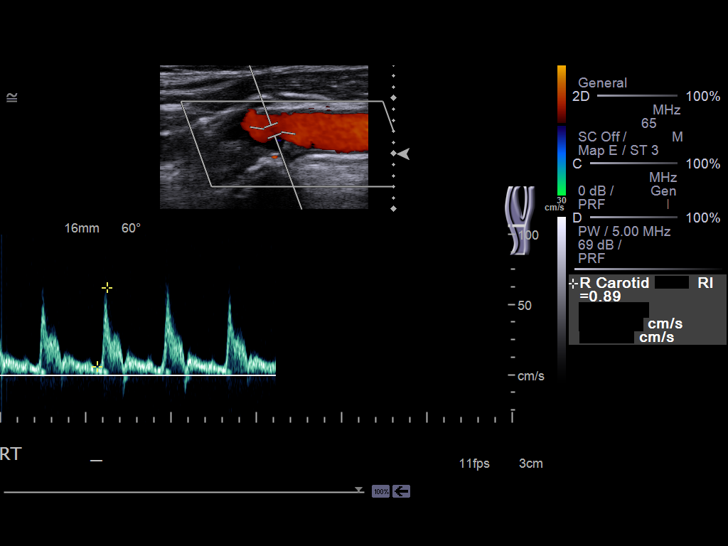
[im 26/73]
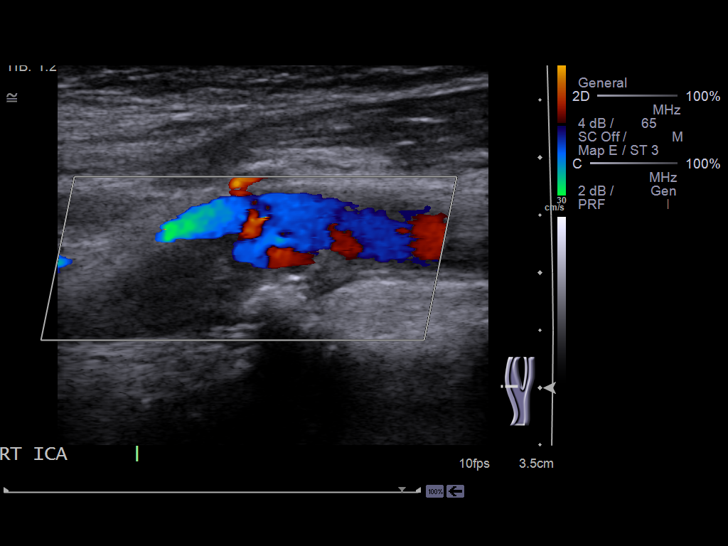
[im 32/73]
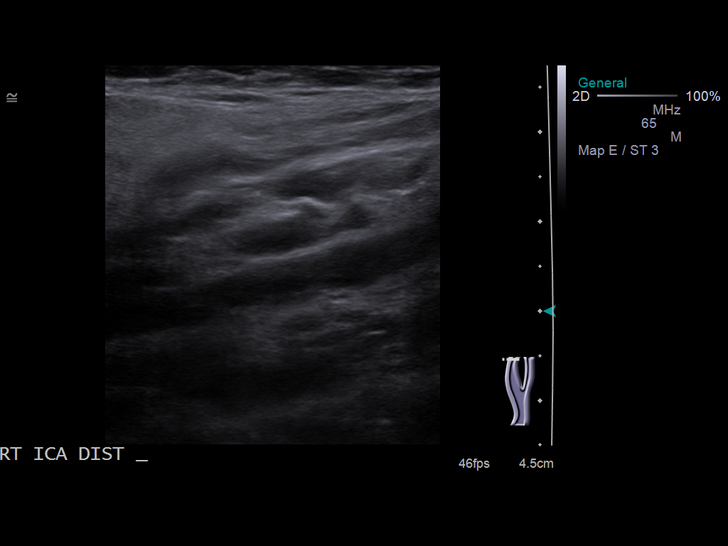
[im 38/73]
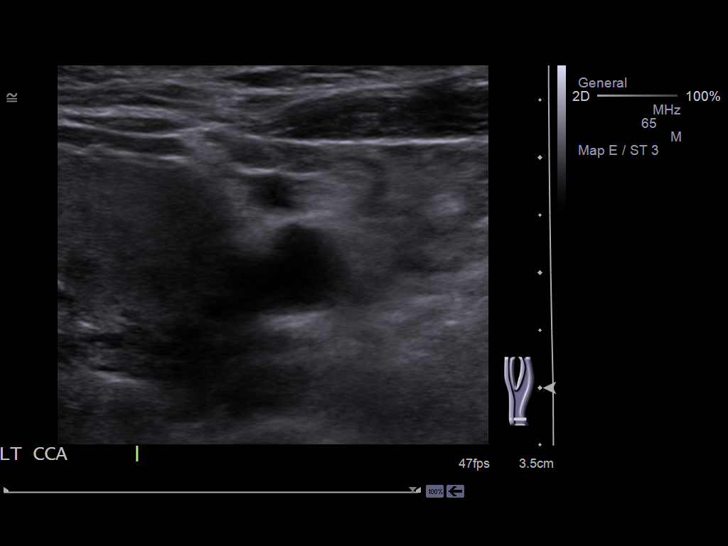
[im 41/73]
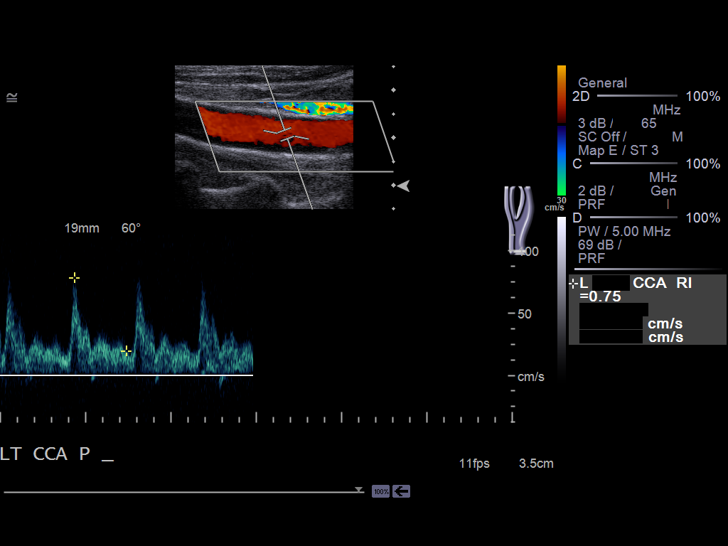
[im 47/73]
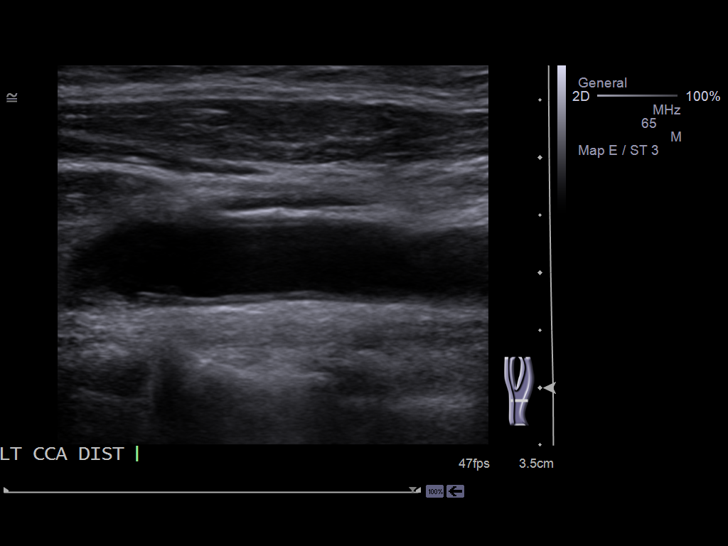
[im 54/73]
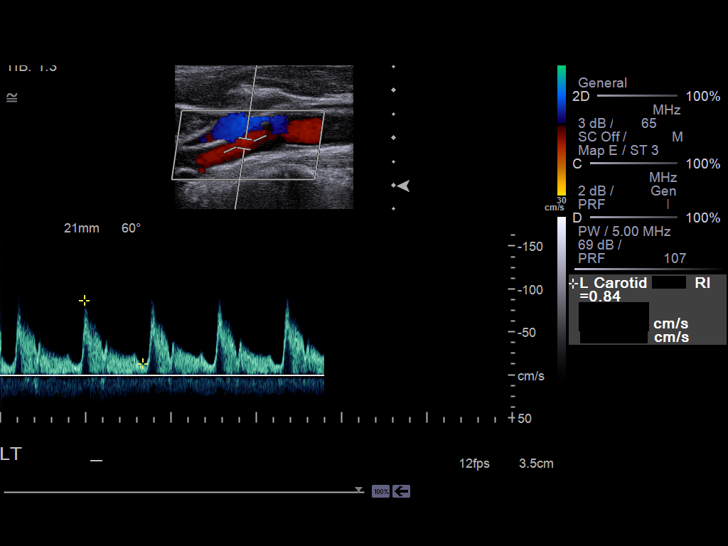
[im 60/73]
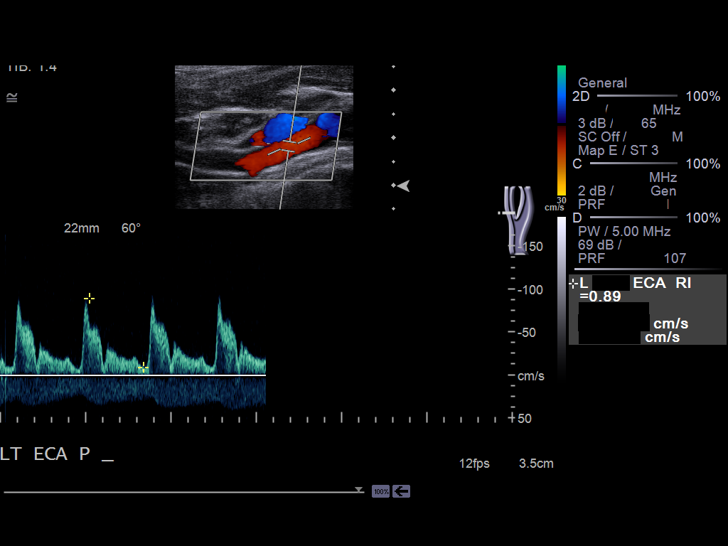
[im 66/73]
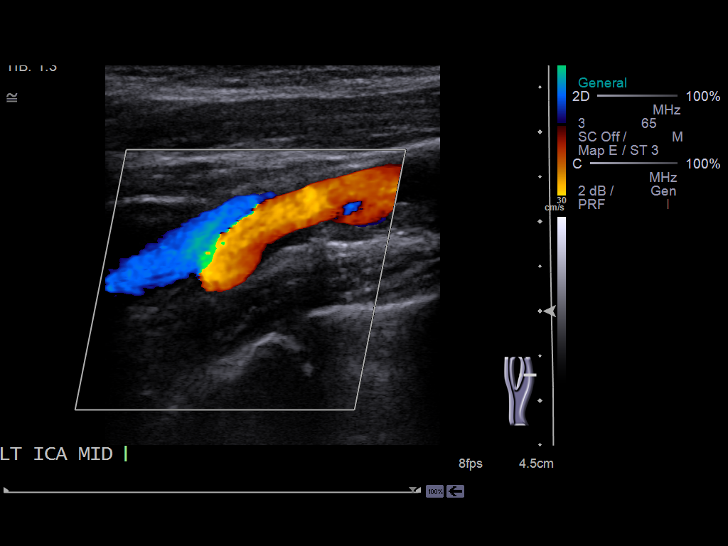
[im 73/73]
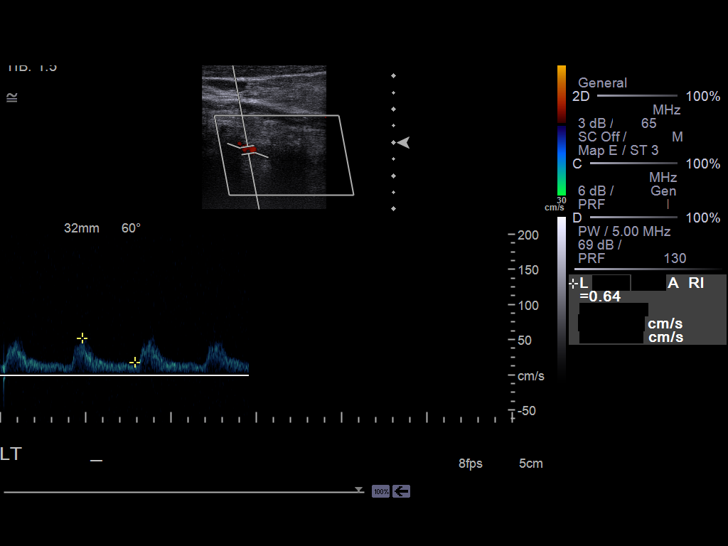

[13 of 24 positions shown; findings below may reference images not displayed]

Criteria:  Quantification of carotid stenosis is based on velocity
parameters that correlate the residual internal carotid diameter
with NASCET-based stenosis levels, using the diameter of the distal
internal carotid lumen as the denominator for stenosis measurement.

The following velocity measurements were obtained:

                 PEAK SYSTOLIC/END DIASTOLIC
RIGHT
ICA:                        0cm/sec
CCA:                        65/6cm/sec
SYSTOLIC ICA/CCA RATIO:     0
DIASTOLIC ICA/CCA RATIO:    0
ECA:                        125cm/sec

LEFT
ICA:                        167/64cm/sec
CCA:                        89/21cm/sec
SYSTOLIC ICA/CCA RATIO:
DIASTOLIC ICA/CCA RATIO:
ECA:                        90cm/sec
FINDINGS: RIGHT CAROTID ARTERY: Occluded right internal carotid artery.
Calcified and noncalcified plaque in the carotid bulb.

RIGHT VERTEBRAL ARTERY:  Normal antegrade flow.

LEFT CAROTID ARTERY: Diffuse intimal thickening in the common
carotid artery.  Noncalcified plaque in the carotid bulb and
proximal internal carotid artery.

LEFT VERTEBRAL ARTERY:  Normal antegrade flow.
IMPRESSION: 1.  Completely occluded right internal carotid artery.
2.  Bilateral carotid plaque formation, as described above.  This
is producing an estimated 50-69% stenosis of the left internal
carotid artery.

## 2013-01-14 ENCOUNTER — Other Ambulatory Visit (HOSPITAL_COMMUNITY): Payer: Self-pay | Admitting: Internal Medicine

## 2013-01-14 DIAGNOSIS — I639 Cerebral infarction, unspecified: Secondary | ICD-10-CM

## 2013-01-14 DIAGNOSIS — I2510 Atherosclerotic heart disease of native coronary artery without angina pectoris: Secondary | ICD-10-CM

## 2013-01-14 DIAGNOSIS — Z23 Encounter for immunization: Secondary | ICD-10-CM | POA: Diagnosis not present

## 2013-01-14 DIAGNOSIS — I10 Essential (primary) hypertension: Secondary | ICD-10-CM | POA: Diagnosis not present

## 2013-01-14 DIAGNOSIS — I6789 Other cerebrovascular disease: Secondary | ICD-10-CM | POA: Diagnosis not present

## 2013-01-21 ENCOUNTER — Ambulatory Visit (HOSPITAL_COMMUNITY)
Admission: RE | Admit: 2013-01-21 | Discharge: 2013-01-21 | Disposition: A | Discharge status: Home / Self Care | Payer: Medicare Other | Source: Ambulatory Visit | Attending: Internal Medicine | Admitting: Internal Medicine

## 2013-01-21 DIAGNOSIS — I658 Occlusion and stenosis of other precerebral arteries: Secondary | ICD-10-CM | POA: Insufficient documentation

## 2013-01-21 DIAGNOSIS — I10 Essential (primary) hypertension: Secondary | ICD-10-CM | POA: Diagnosis not present

## 2013-01-21 DIAGNOSIS — I2510 Atherosclerotic heart disease of native coronary artery without angina pectoris: Secondary | ICD-10-CM | POA: Insufficient documentation

## 2013-01-21 DIAGNOSIS — I639 Cerebral infarction, unspecified: Secondary | ICD-10-CM

## 2013-01-21 DIAGNOSIS — I6529 Occlusion and stenosis of unspecified carotid artery: Secondary | ICD-10-CM | POA: Diagnosis not present

## 2013-03-26 ENCOUNTER — Encounter: Payer: Self-pay | Admitting: Gastroenterology

## 2013-07-24 DIAGNOSIS — E785 Hyperlipidemia, unspecified: Secondary | ICD-10-CM | POA: Diagnosis not present

## 2013-07-24 DIAGNOSIS — Z79899 Other long term (current) drug therapy: Secondary | ICD-10-CM | POA: Diagnosis not present

## 2013-07-24 DIAGNOSIS — I10 Essential (primary) hypertension: Secondary | ICD-10-CM | POA: Diagnosis not present

## 2013-07-24 DIAGNOSIS — Z125 Encounter for screening for malignant neoplasm of prostate: Secondary | ICD-10-CM | POA: Diagnosis not present

## 2013-07-24 DIAGNOSIS — I6789 Other cerebrovascular disease: Secondary | ICD-10-CM | POA: Diagnosis not present

## 2013-07-29 DIAGNOSIS — Z0000 Encounter for general adult medical examination without abnormal findings: Secondary | ICD-10-CM | POA: Diagnosis not present

## 2013-08-28 DIAGNOSIS — I10 Essential (primary) hypertension: Secondary | ICD-10-CM | POA: Diagnosis not present

## 2014-01-01 DIAGNOSIS — I10 Essential (primary) hypertension: Secondary | ICD-10-CM | POA: Diagnosis not present

## 2014-01-01 DIAGNOSIS — Z23 Encounter for immunization: Secondary | ICD-10-CM | POA: Diagnosis not present

## 2014-01-01 DIAGNOSIS — G464 Cerebellar stroke syndrome: Secondary | ICD-10-CM | POA: Diagnosis not present

## 2014-01-02 ENCOUNTER — Other Ambulatory Visit (HOSPITAL_COMMUNITY): Payer: Self-pay | Admitting: Internal Medicine

## 2014-01-02 DIAGNOSIS — I7090 Unspecified atherosclerosis: Secondary | ICD-10-CM

## 2014-01-26 ENCOUNTER — Ambulatory Visit (HOSPITAL_COMMUNITY)
Admission: RE | Admit: 2014-01-26 | Discharge: 2014-01-26 | Disposition: A | Discharge status: Home / Self Care | Payer: Medicare Other | Source: Ambulatory Visit | Attending: Internal Medicine | Admitting: Internal Medicine

## 2014-01-26 DIAGNOSIS — I6521 Occlusion and stenosis of right carotid artery: Secondary | ICD-10-CM | POA: Insufficient documentation

## 2014-01-26 DIAGNOSIS — I7090 Unspecified atherosclerosis: Secondary | ICD-10-CM

## 2014-01-26 DIAGNOSIS — I6522 Occlusion and stenosis of left carotid artery: Secondary | ICD-10-CM | POA: Insufficient documentation

## 2014-01-26 DIAGNOSIS — Z09 Encounter for follow-up examination after completed treatment for conditions other than malignant neoplasm: Secondary | ICD-10-CM | POA: Diagnosis present

## 2014-04-27 DIAGNOSIS — H524 Presbyopia: Secondary | ICD-10-CM | POA: Diagnosis not present

## 2014-04-27 DIAGNOSIS — H25819 Combined forms of age-related cataract, unspecified eye: Secondary | ICD-10-CM | POA: Diagnosis not present

## 2014-04-27 DIAGNOSIS — H52223 Regular astigmatism, bilateral: Secondary | ICD-10-CM | POA: Diagnosis not present

## 2014-07-29 DIAGNOSIS — Z79899 Other long term (current) drug therapy: Secondary | ICD-10-CM | POA: Diagnosis not present

## 2014-07-29 DIAGNOSIS — I10 Essential (primary) hypertension: Secondary | ICD-10-CM | POA: Diagnosis not present

## 2014-07-29 DIAGNOSIS — G464 Cerebellar stroke syndrome: Secondary | ICD-10-CM | POA: Diagnosis not present

## 2014-08-06 DIAGNOSIS — Z6825 Body mass index (BMI) 25.0-25.9, adult: Secondary | ICD-10-CM | POA: Diagnosis not present

## 2014-08-06 DIAGNOSIS — N183 Chronic kidney disease, stage 3 (moderate): Secondary | ICD-10-CM | POA: Diagnosis not present

## 2014-08-06 DIAGNOSIS — G464 Cerebellar stroke syndrome: Secondary | ICD-10-CM | POA: Diagnosis not present

## 2014-08-06 DIAGNOSIS — I2510 Atherosclerotic heart disease of native coronary artery without angina pectoris: Secondary | ICD-10-CM | POA: Diagnosis not present

## 2015-02-01 DIAGNOSIS — I10 Essential (primary) hypertension: Secondary | ICD-10-CM | POA: Diagnosis not present

## 2015-02-01 DIAGNOSIS — I6781 Acute cerebrovascular insufficiency: Secondary | ICD-10-CM | POA: Diagnosis not present

## 2015-02-01 DIAGNOSIS — Z79899 Other long term (current) drug therapy: Secondary | ICD-10-CM | POA: Diagnosis not present

## 2015-02-08 ENCOUNTER — Other Ambulatory Visit (HOSPITAL_COMMUNITY): Payer: Self-pay | Admitting: Internal Medicine

## 2015-02-08 DIAGNOSIS — N183 Chronic kidney disease, stage 3 (moderate): Secondary | ICD-10-CM | POA: Diagnosis not present

## 2015-02-08 DIAGNOSIS — G463 Brain stem stroke syndrome: Secondary | ICD-10-CM | POA: Diagnosis not present

## 2015-02-08 DIAGNOSIS — I6522 Occlusion and stenosis of left carotid artery: Secondary | ICD-10-CM

## 2015-02-08 DIAGNOSIS — Z6824 Body mass index (BMI) 24.0-24.9, adult: Secondary | ICD-10-CM | POA: Diagnosis not present

## 2015-02-08 DIAGNOSIS — I10 Essential (primary) hypertension: Secondary | ICD-10-CM | POA: Diagnosis not present

## 2015-02-08 DIAGNOSIS — Z23 Encounter for immunization: Secondary | ICD-10-CM | POA: Diagnosis not present

## 2015-02-15 ENCOUNTER — Ambulatory Visit (HOSPITAL_COMMUNITY)
Admission: RE | Admit: 2015-02-15 | Discharge: 2015-02-15 | Disposition: A | Discharge status: Home / Self Care | Payer: Medicare Other | Source: Ambulatory Visit | Attending: Internal Medicine | Admitting: Internal Medicine

## 2015-02-15 DIAGNOSIS — I6523 Occlusion and stenosis of bilateral carotid arteries: Secondary | ICD-10-CM | POA: Insufficient documentation

## 2015-02-15 DIAGNOSIS — I6522 Occlusion and stenosis of left carotid artery: Secondary | ICD-10-CM

## 2015-02-19 ENCOUNTER — Other Ambulatory Visit: Payer: Self-pay | Admitting: *Deleted

## 2015-02-19 DIAGNOSIS — I6522 Occlusion and stenosis of left carotid artery: Secondary | ICD-10-CM

## 2015-03-09 ENCOUNTER — Encounter: Payer: Self-pay | Admitting: Vascular Surgery

## 2015-03-16 ENCOUNTER — Ambulatory Visit (HOSPITAL_COMMUNITY)
Admission: RE | Admit: 2015-03-16 | Discharge: 2015-03-16 | Disposition: A | Discharge status: Home / Self Care | Payer: Medicare Other | Source: Ambulatory Visit | Attending: Vascular Surgery | Admitting: Vascular Surgery

## 2015-03-16 ENCOUNTER — Ambulatory Visit (INDEPENDENT_AMBULATORY_CARE_PROVIDER_SITE_OTHER): Payer: Medicare Other | Admitting: Vascular Surgery

## 2015-03-16 ENCOUNTER — Encounter: Payer: Self-pay | Admitting: Vascular Surgery

## 2015-03-16 VITALS — BP 135/82 | HR 100 | Ht 72.0 in | Wt 177.5 lb

## 2015-03-16 DIAGNOSIS — I6522 Occlusion and stenosis of left carotid artery: Secondary | ICD-10-CM | POA: Diagnosis not present

## 2015-03-16 DIAGNOSIS — I10 Essential (primary) hypertension: Secondary | ICD-10-CM | POA: Insufficient documentation

## 2015-03-16 DIAGNOSIS — I739 Peripheral vascular disease, unspecified: Secondary | ICD-10-CM | POA: Insufficient documentation

## 2015-03-16 NOTE — Addendum Note (Signed)
Addended by: Adria Dill L on: 03/16/2015 01:24 PM   Modules accepted: Orders

## 2015-03-16 NOTE — Progress Notes (Signed)
Vascular and Vein Specialist of [place]  Patient name: Jerry Rodriguez MRN: [id number] DOB: May 13, 1940 Sex: male  REASON FOR CONSULT: Evaluation of carotid disease  HPI: Jerry Rodriguez is a 75 y.o. male, who is her today with his son and daughter. He has a prior history of symptomatic carotid disease. In 2012 he had an episode of right arm weakness and difficulty with speech. He presented to an outlying hospital and this showed MRI results of acute right brain event. Also diagnostic workup at that time showed complete occlusion of his right internal carotid artery. He has had no focal deficits since that event in 2012. He feels as though he returns to his baseline. His family states that he is having more difficulty with confusion and agitation and shaky. They feel somewhat uncomfortable allowing grandchildren to ride with him. He has had no new focal deficits. Has a long history of smoking is not willing to discontinue this.  Past Medical History  Diagnosis Date  . Hypertension   . COPD (chronic obstructive pulmonary disease) (HCC)   . Stroke (HCC)   . Peripheral vascular disease (HCC)   . Carotid artery occlusion     History reviewed. No pertinent family history.  SOCIAL HISTORY: Social History   Social History  . Marital Status: Married    Spouse Name: N/A  . Number of Children: N/A  . Years of Education: N/A   Occupational History  . Not on file.   Social History Main Topics  . Smoking status: Current Every Day Smoker -- 1.00 packs/day for 60 years    Types: Cigarettes  . Smokeless tobacco: Not on file  . Alcohol Use: No  . Drug Use: No  . Sexual Activity: Not on file   Other Topics Concern  . Not on file   Social History Narrative    No Known Allergies  Current Outpatient Prescriptions  Medication Sig Dispense Refill  . amLODipine (NORVASC) 5 MG tablet Take 5 mg by mouth daily.      Marland Kitchen aspirin 81 MG tablet Take 81 mg by mouth daily.    Marland Kitchen atorvastatin  (LIPITOR) 20 MG tablet TK 1 T PO QHS  4  . ramipril (ALTACE) 10 MG capsule TK 1 C PO D  4  . atorvastatin (LIPITOR) 20 MG tablet Take 1 tablet (20 mg total) by mouth daily. 30 tablet 12  . ramipril (ALTACE) 2.5 MG capsule Take 2 capsules (5 mg total) by mouth daily. 30 capsule 12   No current facility-administered medications for this visit.    REVIEW OF SYSTEMS:   denotes positive finding,  denotes negative finding Cardiac  Comments:  Chest pain or chest pressure:    Shortness of breath upon exertion:    Short of breath when lying flat:    Irregular heart rhythm:        Vascular    Pain in calf, thigh, or hip brought on by ambulation:    Pain in feet at night that wakes you up from your sleep:     Blood clot in your veins:    Leg swelling:         Pulmonary    Oxygen at home:    Productive cough:     Wheezing:         Neurologic    Sudden weakness in arms or legs:     Sudden numbness in arms or legs:     Sudden onset of difficulty speaking or slurred  speech:    Temporary loss of vision in one eye:     Problems with dizziness:         Gastrointestinal    Blood in stool:     Vomited blood:         Genitourinary    Burning when urinating:     Blood in urine:        Psychiatric    Major depression:         Hematologic    Bleeding problems:    Problems with blood clotting too easily:        Skin    Rashes or ulcers:        Constitutional    Fever or chills: x     PHYSICAL EXAM: Filed Vitals:   03/16/15 1056 03/16/15 1058  BP: 133/81 135/82  Pulse: 100   Height: 6' (1.829 m)   Weight: 177 lb 8 oz (80.513 kg)   SpO2: 96%     GENERAL: The patient is a well-nourished male, in no acute distress. The vital signs are documented above. CARDIAC: There is a regular rate and rhythm.  VASCULAR: 2+ radial pulses bilaterally. Soft bilateral carotid bruits. 2+ dorsalis pedis pulses bilaterally PULMONARY: There is good air exchange bilaterally without wheezing  or rales. ABDOMEN: Soft and non-tender with normal pitched bowel sounds. No aneurysm palpable MUSCULOSKELETAL: There are no major deformities or cyanosis. NEUROLOGIC: No focal weakness or paresthesias are detected. SKIN: There are no ulcers or rashes noted. PSYCHIATRIC: The patient has a normal affect. Patient is very hard of hearing  DATA:  He underwent a repeat left carotid duplex today and [facility] He is at the low end of the 60-79% stenosis.  MEDICAL ISSUES: Had long discussion with the patient and his family present. I explained that although he had a right carotid occlusion he did have right body symptoms and speech symptoms with a stroke in 2012. Fortunately returned to his normal baseline. Explained there is no treatment option for occluded carotid artery. Spine we would recommend continued duplex surveillance to ensure that he has no progression of his left carotid. Did explain symptoms of left carotid disease to the patient and they will notify us should this occur. So discussed his confusion and agitation. Port that there is a family history of this in his parents and grandparents. I explained that the correction of his carotid disease would have no bearing on this time that this may be progressive as he ages. They were comfortable with this discussion and will see [place] again in one year with repeat carotid duplex   Early, Todd V[facility] and Vein Specialists of [employer]: [phone]

## 2015-08-02 DIAGNOSIS — I6781 Acute cerebrovascular insufficiency: Secondary | ICD-10-CM | POA: Diagnosis not present

## 2015-08-02 DIAGNOSIS — I10 Essential (primary) hypertension: Secondary | ICD-10-CM | POA: Diagnosis not present

## 2015-08-02 DIAGNOSIS — R7301 Impaired fasting glucose: Secondary | ICD-10-CM | POA: Diagnosis not present

## 2015-08-02 DIAGNOSIS — E785 Hyperlipidemia, unspecified: Secondary | ICD-10-CM | POA: Diagnosis not present

## 2015-08-02 DIAGNOSIS — Z79899 Other long term (current) drug therapy: Secondary | ICD-10-CM | POA: Diagnosis not present

## 2015-08-02 DIAGNOSIS — Z125 Encounter for screening for malignant neoplasm of prostate: Secondary | ICD-10-CM | POA: Diagnosis not present

## 2015-08-10 DIAGNOSIS — I10 Essential (primary) hypertension: Secondary | ICD-10-CM | POA: Diagnosis not present

## 2015-08-10 DIAGNOSIS — E785 Hyperlipidemia, unspecified: Secondary | ICD-10-CM | POA: Diagnosis not present

## 2015-08-10 DIAGNOSIS — Z23 Encounter for immunization: Secondary | ICD-10-CM | POA: Diagnosis not present

## 2015-08-10 DIAGNOSIS — G463 Brain stem stroke syndrome: Secondary | ICD-10-CM | POA: Diagnosis not present

## 2016-02-14 DIAGNOSIS — Z6823 Body mass index (BMI) 23.0-23.9, adult: Secondary | ICD-10-CM | POA: Diagnosis not present

## 2016-02-14 DIAGNOSIS — Z23 Encounter for immunization: Secondary | ICD-10-CM | POA: Diagnosis not present

## 2016-02-14 DIAGNOSIS — I6522 Occlusion and stenosis of left carotid artery: Secondary | ICD-10-CM | POA: Diagnosis not present

## 2016-02-14 DIAGNOSIS — I10 Essential (primary) hypertension: Secondary | ICD-10-CM | POA: Diagnosis not present

## 2016-03-15 ENCOUNTER — Encounter: Payer: Self-pay | Admitting: Vascular Surgery

## 2016-03-21 ENCOUNTER — Ambulatory Visit (HOSPITAL_COMMUNITY)
Admission: RE | Admit: 2016-03-21 | Discharge: 2016-03-21 | Disposition: A | Discharge status: Home / Self Care | Payer: Medicare Other | Source: Ambulatory Visit | Attending: Vascular Surgery | Admitting: Vascular Surgery

## 2016-03-21 ENCOUNTER — Ambulatory Visit (INDEPENDENT_AMBULATORY_CARE_PROVIDER_SITE_OTHER): Payer: Medicare Other | Admitting: Vascular Surgery

## 2016-03-21 VITALS — BP 123/85 | HR 86 | Temp 97.6°F | Resp 20 | Ht 72.0 in | Wt 172.0 lb

## 2016-03-21 DIAGNOSIS — I6522 Occlusion and stenosis of left carotid artery: Secondary | ICD-10-CM | POA: Diagnosis not present

## 2016-03-21 DIAGNOSIS — I6523 Occlusion and stenosis of bilateral carotid arteries: Secondary | ICD-10-CM | POA: Diagnosis not present

## 2016-03-21 NOTE — Addendum Note (Signed)
Addended by: Burton ApleyPETTY, Shanna Strength A on: 03/21/2016 02:13 PM   Modules accepted: Orders

## 2016-03-21 NOTE — Progress Notes (Signed)
Vascular and Vein Specialist of [place]  Patient name: Jerry Rodriguez MRN: [id number] DOB: April 16, 1940 Sex: male  REASON FOR VISIT: Follow-up of carotid stenosis  HPI: Jerry Rodriguez is a 76 y.o. male here today for follow-up of his known carotid disease. He is here with his daughter. They both report that he has had no difficulty since our last visit with him. He has stream hard of hearing. The daughter had reported some confusion one year ago but she reports this is stable and somewhat difficult to separate from the extreme hearing loss. He has had no focal neurologic deficits. He has had no episodes of amaurosis fugax or a fascia. He does remain active. Continues to smoke cigarettes  Past Medical History:  Diagnosis Date  . Carotid artery occlusion   . COPD (chronic obstructive pulmonary disease) (HCC)   . Hypertension   . Peripheral vascular disease (HCC)   . Stroke [facility](HCC)     No family history on file.  SOCIAL HISTORY: Social History  Substance Use Topics  . Smoking status: Current Every Day Smoker    Packs/day: 1.00    Years: 60.00    Types: Cigarettes  . Smokeless tobacco: Not on file  . Alcohol use No    No Known Allergies  Current Outpatient Prescriptions  Medication Sig Dispense Refill  . amLODipine (NORVASC) 5 MG tablet Take 5 mg by mouth daily.      Marland Kitchen. aspirin 81 MG tablet Take 81 mg by mouth daily.    Marland Kitchen. atorvastatin (LIPITOR) 20 MG tablet TK 1 T PO QHS  4  . ramipril (ALTACE) 10 MG capsule TK 1 C PO D  4   No current facility-administered medications for this visit.     REVIEW OF SYSTEMS:  [X]  denotes positive finding, [ ]  denotes negative finding Cardiac  Comments:  Chest pain or chest pressure:    Shortness of breath upon exertion:    Short of breath when lying flat:    Irregular heart rhythm:        Vascular    Pain in calf, thigh, or hip brought on by ambulation:    Pain in feet at night that wakes you up  from your sleep:     Blood clot in your veins:    Leg swelling:           PHYSICAL EXAM: Vitals:   03/21/16 1124 03/21/16 1130  BP: (!) 143/80 123/85  Pulse: 86   Resp: 20   Temp: 97.6 F (36.4 C)   TempSrc: Oral   SpO2: 98%   Weight: 172 lb (78 kg)   Height: 6' (1.829 m)     GENERAL: The patient is a well-nourished male, in no acute distress. The vital signs are documented above. CARDIOVASCULAR: I do not hear any carotid bruits bilaterally. 2+ radial and 2+ dorsalis pedis pulses bilaterally PULMONARY: There is good air exchange  MUSCULOSKELETAL: There are no major deformities or cyanosis. NEUROLOGIC: No focal weakness or paresthesias are detected. SKIN: There are no ulcers or rashes noted. PSYCHIATRIC: The patient has a normal affect.  DATA:  Carotid duplex reveals known occlusion of his internal carotid artery on the right. He has a 60-79% stenosis left carotid artery possibly with elevated velocities compensating for contralateral occlusion  MEDICAL ISSUES: Stable carotid disease. Again discussed symptoms with the patient's family who understands. Will see again in one year for continued follow-up.    Larina Earthlyodd F. Meagon Duskin, MD FACS Vascular and Vein  Specialists of [facility] Tel [phone] Pager [phone]

## 2016-08-15 DIAGNOSIS — R7301 Impaired fasting glucose: Secondary | ICD-10-CM | POA: Diagnosis not present

## 2016-08-15 DIAGNOSIS — I10 Essential (primary) hypertension: Secondary | ICD-10-CM | POA: Diagnosis not present

## 2016-08-15 DIAGNOSIS — E785 Hyperlipidemia, unspecified: Secondary | ICD-10-CM | POA: Diagnosis not present

## 2016-08-15 DIAGNOSIS — Z79899 Other long term (current) drug therapy: Secondary | ICD-10-CM | POA: Diagnosis not present

## 2016-08-22 DIAGNOSIS — I6529 Occlusion and stenosis of unspecified carotid artery: Secondary | ICD-10-CM | POA: Diagnosis not present

## 2016-08-22 DIAGNOSIS — N183 Chronic kidney disease, stage 3 (moderate): Secondary | ICD-10-CM | POA: Diagnosis not present

## 2016-08-22 DIAGNOSIS — Z6825 Body mass index (BMI) 25.0-25.9, adult: Secondary | ICD-10-CM | POA: Diagnosis not present

## 2016-08-22 DIAGNOSIS — I10 Essential (primary) hypertension: Secondary | ICD-10-CM | POA: Diagnosis not present

## 2017-03-26 DIAGNOSIS — Z6825 Body mass index (BMI) 25.0-25.9, adult: Secondary | ICD-10-CM | POA: Diagnosis not present

## 2017-03-26 DIAGNOSIS — I10 Essential (primary) hypertension: Secondary | ICD-10-CM | POA: Diagnosis not present

## 2017-03-26 DIAGNOSIS — Z8673 Personal history of transient ischemic attack (TIA), and cerebral infarction without residual deficits: Secondary | ICD-10-CM | POA: Diagnosis not present

## 2017-03-27 ENCOUNTER — Encounter: Payer: Self-pay | Admitting: Vascular Surgery

## 2017-03-27 ENCOUNTER — Ambulatory Visit (HOSPITAL_COMMUNITY)
Admission: RE | Admit: 2017-03-27 | Discharge: 2017-03-27 | Disposition: A | Discharge status: Home / Self Care | Payer: Medicare Other | Source: Ambulatory Visit | Attending: Vascular Surgery | Admitting: Vascular Surgery

## 2017-03-27 ENCOUNTER — Ambulatory Visit (INDEPENDENT_AMBULATORY_CARE_PROVIDER_SITE_OTHER): Payer: Medicare Other | Admitting: Vascular Surgery

## 2017-03-27 VITALS — BP 124/81 | HR 91 | Temp 97.4°F | Resp 16 | Ht 72.0 in | Wt 186.0 lb

## 2017-03-27 DIAGNOSIS — I6522 Occlusion and stenosis of left carotid artery: Secondary | ICD-10-CM | POA: Insufficient documentation

## 2017-03-27 NOTE — Progress Notes (Signed)
    Vascular and Vein Specialist of [place]  Patient name: Jerry Rodriguez MRN: [id number] DOB: 04-03-1940 Sex: male  REASON FOR VISIT: Follow-up of asymptomatic carotid disease  HPI: Jerry Rodriguez is a 77 y.o. male here today for carotid duplex follow-up.  He has had no new medical difficulties since my last visit with him.  Specifically no new neurologic deficits.  Remains quite active.  Unfortunately continues to smoke cigarettes.  Past Medical History:  Diagnosis Date  . Carotid artery occlusion   . COPD (chronic obstructive pulmonary disease) (HCC)   . Hypertension   . Peripheral vascular disease (HCC)   . Stroke [facility](HCC)     History reviewed. No pertinent family history.  SOCIAL HISTORY: Social History   Tobacco Use  . Smoking status: Current Every Day Smoker    Packs/day: 1.00    Years: 60.00    Pack years: 60.00    Types: Cigarettes  Substance Use Topics  . Alcohol use: No    Alcohol/week: 0.0 oz    No Known Allergies  Current Outpatient Medications  Medication Sig Dispense Refill  . amLODipine (NORVASC) 5 MG tablet Take 5 mg by mouth daily.      Marland Kitchen. aspirin 81 MG tablet Take 81 mg by mouth daily.    Marland Kitchen. atorvastatin (LIPITOR) 20 MG tablet TK 1 Jerry PO QHS  4  . ramipril (ALTACE) 10 MG capsule TK 1 C PO D  4   No current facility-administered medications for this visit.     REVIEW OF SYSTEMS:  [X]  denotes positive finding, [ ]  denotes negative finding Cardiac  Comments:  Chest pain or chest pressure:    Shortness of breath upon exertion:    Short of breath when lying flat:    Irregular heart rhythm:        Vascular    Pain in calf, thigh, or hip brought on by ambulation:    Pain in feet at night that wakes you up from your sleep:     Blood clot in your veins:    Leg swelling:           PHYSICAL EXAM: Vitals:   03/27/17 1250 03/27/17 1251  BP: 117/74 124/81  Pulse: 91   Resp: 16   Temp: (!) 97.4 F (36.3 C)     SpO2: 94%   Weight: 186 lb (84.4 kg)   Height: 6' (1.829 m)     GENERAL: The patient is a well-nourished male, in no acute distress. The vital signs are documented above. CARDIOVASCULAR: I do not appreciate carotid bruits on his right or left carotid system.  2+ radial pulses bilaterally PULMONARY: There is good air exchange  MUSCULOSKELETAL: There are no major deformities or cyanosis. NEUROLOGIC: No focal weakness or paresthesias are detected. SKIN: There are no ulcers or rashes noted. PSYCHIATRIC: The patient has a normal affect.  DATA:  Carotid duplex reveals known occlusion of his right internal carotid artery.  Left carotid shows no change in his moderate to severe stenosis in the 60-79% stenosis level.  Vertebral artery flow is antegrade bilaterally.  MEDICAL ISSUES: I discussed this with the patient.  He will presented to the emergency room should he develop any neurologic deficits.  Otherwise we will see him again in 1 year with attenuated carotid duplex surveillance.    Larina Earthlyodd F. Nakaya Mishkin, MD FACS [facility] Tel [phone] Pager [phone]

## 2017-09-06 DIAGNOSIS — I10 Essential (primary) hypertension: Secondary | ICD-10-CM | POA: Diagnosis not present

## 2017-09-06 DIAGNOSIS — N183 Chronic kidney disease, stage 3 (moderate): Secondary | ICD-10-CM | POA: Diagnosis not present

## 2017-09-06 DIAGNOSIS — E785 Hyperlipidemia, unspecified: Secondary | ICD-10-CM | POA: Diagnosis not present

## 2017-09-06 DIAGNOSIS — Z79899 Other long term (current) drug therapy: Secondary | ICD-10-CM | POA: Diagnosis not present

## 2017-09-06 DIAGNOSIS — I2510 Atherosclerotic heart disease of native coronary artery without angina pectoris: Secondary | ICD-10-CM | POA: Diagnosis not present

## 2017-09-13 DIAGNOSIS — I10 Essential (primary) hypertension: Secondary | ICD-10-CM | POA: Diagnosis not present

## 2017-09-13 DIAGNOSIS — S81811A Laceration without foreign body, right lower leg, initial encounter: Secondary | ICD-10-CM | POA: Diagnosis not present

## 2017-09-13 DIAGNOSIS — E785 Hyperlipidemia, unspecified: Secondary | ICD-10-CM | POA: Diagnosis not present

## 2017-09-13 DIAGNOSIS — N183 Chronic kidney disease, stage 3 (moderate): Secondary | ICD-10-CM | POA: Diagnosis not present

## 2017-09-13 DIAGNOSIS — Z8673 Personal history of transient ischemic attack (TIA), and cerebral infarction without residual deficits: Secondary | ICD-10-CM | POA: Diagnosis not present

## 2017-09-13 DIAGNOSIS — Z6826 Body mass index (BMI) 26.0-26.9, adult: Secondary | ICD-10-CM | POA: Diagnosis not present

## 2018-02-19 DIAGNOSIS — Z23 Encounter for immunization: Secondary | ICD-10-CM | POA: Diagnosis not present

## 2018-03-07 DIAGNOSIS — I10 Essential (primary) hypertension: Secondary | ICD-10-CM | POA: Diagnosis not present

## 2018-03-07 DIAGNOSIS — Z8673 Personal history of transient ischemic attack (TIA), and cerebral infarction without residual deficits: Secondary | ICD-10-CM | POA: Diagnosis not present

## 2018-03-07 DIAGNOSIS — Z6827 Body mass index (BMI) 27.0-27.9, adult: Secondary | ICD-10-CM | POA: Diagnosis not present

## 2018-07-05 ENCOUNTER — Other Ambulatory Visit: Payer: Self-pay

## 2018-07-05 DIAGNOSIS — I6522 Occlusion and stenosis of left carotid artery: Secondary | ICD-10-CM

## 2018-07-09 ENCOUNTER — Encounter: Payer: Self-pay | Admitting: Vascular Surgery

## 2018-07-09 ENCOUNTER — Ambulatory Visit (HOSPITAL_COMMUNITY)
Admission: RE | Admit: 2018-07-09 | Discharge: 2018-07-09 | Disposition: A | Discharge status: Home / Self Care | Payer: Medicare Other | Source: Ambulatory Visit | Attending: Vascular Surgery | Admitting: Vascular Surgery

## 2018-07-09 ENCOUNTER — Other Ambulatory Visit: Payer: Self-pay

## 2018-07-09 ENCOUNTER — Ambulatory Visit (INDEPENDENT_AMBULATORY_CARE_PROVIDER_SITE_OTHER): Payer: Medicare Other | Admitting: Vascular Surgery

## 2018-07-09 VITALS — BP 134/79 | HR 69 | Temp 97.6°F | Resp 20 | Ht 72.0 in | Wt 185.0 lb

## 2018-07-09 DIAGNOSIS — I6522 Occlusion and stenosis of left carotid artery: Secondary | ICD-10-CM

## 2018-07-09 NOTE — Progress Notes (Signed)
    Vascular and Vein Specialist of [place]  Patient name: Jerry Rodriguez MRN: [id number] DOB: 1940/10/16 Sex: male  REASON FOR VISIT: Follow-up carotid disease  HPI: Jerry Rodriguez is a 78 y.o. male here today for follow-up.  He has no new medical difficulties and denies any new strokelike symptoms.  He is extremely hard of hearing.  He also continues to smoke 1 pack cigarettes per day  Past Medical History:  Diagnosis Date  . Carotid artery occlusion   . COPD (chronic obstructive pulmonary disease) ([place])   . Hypertension   . Peripheral vascular disease ([place])   . Stroke [facility])     History reviewed. No pertinent family history.  SOCIAL HISTORY: Social History   Tobacco Use  . Smoking status: Current Every Day Smoker    Packs/day: 1.00    Years: 60.00    Pack years: 60.00    Types: Cigarettes  . Smokeless tobacco: Never Used  Substance Use Topics  . Alcohol use: No    Alcohol/week: 0.0 standard drinks    No Known Allergies  Current Outpatient Medications  Medication Sig Dispense Refill  . amLODipine (NORVASC) 5 MG tablet Take 5 mg by mouth daily.      Marland Kitchen aspirin 81 MG tablet Take 81 mg by mouth daily.    Marland Kitchen atorvastatin (LIPITOR) 20 MG tablet TK 1 T PO QHS  4  . ramipril (ALTACE) 10 MG capsule TK 1 C PO D  4   No current facility-administered medications for this visit.     REVIEW OF SYSTEMS:  [X]  denotes positive finding, [ ]  denotes negative finding Cardiac  Comments:  Chest pain or chest pressure:    Shortness of breath upon exertion: x   Short of breath when lying flat:    Irregular heart rhythm:        Vascular    Pain in calf, thigh, or hip brought on by ambulation:    Pain in feet at night that wakes you up from your sleep:     Blood clot in your veins:    Leg swelling:           PHYSICAL EXAM: Vitals:   07/09/18 1321 07/09/18 1323  BP: 130/83 134/79  Pulse: 69   Resp: 20   Temp: 97.6 F (36.4 C)    SpO2: 97%   Weight: 83.9 kg   Height: 6' (1.829 m)     GENERAL: The patient is a well-nourished male, in no acute distress. The vital signs are documented above. CARDIOVASCULAR: Carotid arteries without bruits bilaterally PULMONARY: There is good air exchange  MUSCULOSKELETAL: There are no major deformities or cyanosis. NEUROLOGIC: No focal weakness or paresthesias are detected. SKIN: There are no ulcers or rashes noted. PSYCHIATRIC: The patient has a normal affect.  DATA:  Carotid duplex reveals none right internal carotid artery occlusion.  60 to 79% left carotid stenosis which is unchanged from his study 1 year ago  MEDICAL ISSUES: Stable carotid disease.  Again reviewed symptoms of left brain event with the patient.  He will present immediately to the emergency room should this occur.  Otherwise we will see him again in 1 year with repeat carotid duplex follow-up    Rosetta Posner, MD [facility] Vascular and Vein Specialists of [facility] Tel [phone] Pager [phone]

## 2018-10-03 DIAGNOSIS — G464 Cerebellar stroke syndrome: Secondary | ICD-10-CM | POA: Diagnosis not present

## 2018-10-03 DIAGNOSIS — I2510 Atherosclerotic heart disease of native coronary artery without angina pectoris: Secondary | ICD-10-CM | POA: Diagnosis not present

## 2018-10-03 DIAGNOSIS — H9190 Unspecified hearing loss, unspecified ear: Secondary | ICD-10-CM | POA: Diagnosis not present

## 2018-10-03 DIAGNOSIS — I10 Essential (primary) hypertension: Secondary | ICD-10-CM | POA: Diagnosis not present

## 2018-10-03 DIAGNOSIS — E785 Hyperlipidemia, unspecified: Secondary | ICD-10-CM | POA: Diagnosis not present

## 2018-10-03 DIAGNOSIS — Z79899 Other long term (current) drug therapy: Secondary | ICD-10-CM | POA: Diagnosis not present

## 2018-10-10 DIAGNOSIS — E785 Hyperlipidemia, unspecified: Secondary | ICD-10-CM | POA: Diagnosis not present

## 2018-10-10 DIAGNOSIS — Z8673 Personal history of transient ischemic attack (TIA), and cerebral infarction without residual deficits: Secondary | ICD-10-CM | POA: Diagnosis not present

## 2018-10-10 DIAGNOSIS — I10 Essential (primary) hypertension: Secondary | ICD-10-CM | POA: Diagnosis not present

## 2018-10-10 DIAGNOSIS — N183 Chronic kidney disease, stage 3 (moderate): Secondary | ICD-10-CM | POA: Diagnosis not present

## 2018-11-08 DIAGNOSIS — R311 Benign essential microscopic hematuria: Secondary | ICD-10-CM | POA: Diagnosis not present

## 2019-08-27 ENCOUNTER — Other Ambulatory Visit: Payer: Self-pay

## 2019-08-27 DIAGNOSIS — I6522 Occlusion and stenosis of left carotid artery: Secondary | ICD-10-CM

## 2019-09-09 ENCOUNTER — Ambulatory Visit (INDEPENDENT_AMBULATORY_CARE_PROVIDER_SITE_OTHER): Payer: Medicare Other | Admitting: Vascular Surgery

## 2019-09-09 ENCOUNTER — Encounter: Payer: Self-pay | Admitting: Vascular Surgery

## 2019-09-09 ENCOUNTER — Ambulatory Visit (HOSPITAL_COMMUNITY)
Admission: RE | Admit: 2019-09-09 | Discharge: 2019-09-09 | Disposition: A | Discharge status: Home / Self Care | Payer: Medicare Other | Source: Ambulatory Visit | Attending: Vascular Surgery | Admitting: Vascular Surgery

## 2019-09-09 ENCOUNTER — Other Ambulatory Visit: Payer: Self-pay

## 2019-09-09 VITALS — BP 107/75 | HR 100 | Temp 98.2°F | Resp 20 | Ht 72.0 in | Wt 187.0 lb

## 2019-09-09 DIAGNOSIS — I6522 Occlusion and stenosis of left carotid artery: Secondary | ICD-10-CM | POA: Diagnosis not present

## 2019-09-09 NOTE — Progress Notes (Signed)
    Vascular and Vein Specialist of [place]  Patient name: Jerry Rodriguez MRN: [id number] DOB: 08/11/1940 Sex: male  REASON FOR VISIT: Follow-up follow-up known carotid stenosis  HPI: Jerry Rodriguez is a 79 y.o. male here today for follow-up.  He denies any new neurologic deficits.  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  He continues to smoke 1 pack cigarettes per day.  He is extremely hard of hearing.  Past Medical History:  Diagnosis Date  . Carotid artery occlusion   . COPD (chronic obstructive pulmonary disease) (HCC)   . Hypertension   . Peripheral vascular disease (HCC)   . Stroke [facility])     History reviewed. No pertinent family history.  SOCIAL HISTORY: Social History   Tobacco Use  . Smoking status: Current Every Day Smoker    Packs/day: 1.00    Years: 60.00    Pack years: 60.00    Types: Cigarettes  . Smokeless tobacco: Never Used  Substance Use Topics  . Alcohol use: No    Alcohol/week: 0.0 standard drinks    No Known Allergies  Current Outpatient Medications  Medication Sig Dispense Refill  . amLODipine (NORVASC) 5 MG tablet Take 5 mg by mouth daily.      Marland Kitchen aspirin 81 MG tablet Take 81 mg by mouth daily.    Marland Kitchen atorvastatin (LIPITOR) 20 MG tablet TK 1 T PO QHS  4  . ramipril (ALTACE) 10 MG capsule TK 1 C PO D  4   No current facility-administered medications for this visit.    REVIEW OF SYSTEMS:  [X]  denotes positive finding, [ ]  denotes negative finding Cardiac  Comments:  Chest pain or chest pressure:    Shortness of breath upon exertion:    Short of breath when lying flat:    Irregular heart rhythm:        Vascular    Pain in calf, thigh, or hip brought on by ambulation:    Pain in feet at night that wakes you up from your sleep:     Blood clot in your veins:    Leg swelling:           PHYSICAL EXAM: Vitals:   09/09/19 1526 09/09/19 1528  BP: 126/77 107/75  Pulse: 100   Resp: 20   Temp:  98.2 F (36.8 C)   SpO2: 93%   Weight: 187 lb (84.8 kg)   Height: 6' (1.829 m)     GENERAL: The patient is a well-nourished male, in no acute distress. The vital signs are documented above. CARDIOVASCULAR: Carotid arteries without bruits bilaterally.  2+ radial pulses bilaterally. PULMONARY: There is good air exchange  MUSCULOSKELETAL: There are no major deformities or cyanosis. NEUROLOGIC: No focal weakness or paresthesias are detected. SKIN: There are no ulcers or rashes noted. PSYCHIATRIC: The patient has a normal affect.  DATA:  Carotid duplex today reveals no change.  His right internal carotid artery is occluded.  His left carotid is in the lower level of the 60 to 79% stenosis range.  MEDICAL ISSUES: Stable overall.  Will continue duplex follow-up.  We will see him again in 1 year in our [place] office.    08-27-1984, MD FACS [facility] Tel [phone] Pager [phone]

## 2019-11-07 DIAGNOSIS — Z79899 Other long term (current) drug therapy: Secondary | ICD-10-CM | POA: Diagnosis not present

## 2019-11-07 DIAGNOSIS — I10 Essential (primary) hypertension: Secondary | ICD-10-CM | POA: Diagnosis not present

## 2019-11-07 DIAGNOSIS — N183 Chronic kidney disease, stage 3 unspecified: Secondary | ICD-10-CM | POA: Diagnosis not present

## 2019-11-07 DIAGNOSIS — G464 Cerebellar stroke syndrome: Secondary | ICD-10-CM | POA: Diagnosis not present

## 2019-11-07 DIAGNOSIS — E785 Hyperlipidemia, unspecified: Secondary | ICD-10-CM | POA: Diagnosis not present

## 2019-11-07 DIAGNOSIS — I2510 Atherosclerotic heart disease of native coronary artery without angina pectoris: Secondary | ICD-10-CM | POA: Diagnosis not present

## 2019-11-14 DIAGNOSIS — Z23 Encounter for immunization: Secondary | ICD-10-CM | POA: Diagnosis not present

## 2019-11-14 DIAGNOSIS — I10 Essential (primary) hypertension: Secondary | ICD-10-CM | POA: Diagnosis not present

## 2019-11-14 DIAGNOSIS — N183 Chronic kidney disease, stage 3 unspecified: Secondary | ICD-10-CM | POA: Diagnosis not present

## 2019-11-14 DIAGNOSIS — G451 Carotid artery syndrome (hemispheric): Secondary | ICD-10-CM | POA: Diagnosis not present

## 2019-11-14 DIAGNOSIS — E785 Hyperlipidemia, unspecified: Secondary | ICD-10-CM | POA: Diagnosis not present

## 2019-12-11 DIAGNOSIS — Z23 Encounter for immunization: Secondary | ICD-10-CM | POA: Diagnosis not present

## 2020-05-07 DIAGNOSIS — Z79899 Other long term (current) drug therapy: Secondary | ICD-10-CM | POA: Diagnosis not present

## 2020-05-07 DIAGNOSIS — I10 Essential (primary) hypertension: Secondary | ICD-10-CM | POA: Diagnosis not present

## 2020-05-07 DIAGNOSIS — E785 Hyperlipidemia, unspecified: Secondary | ICD-10-CM | POA: Diagnosis not present

## 2020-05-07 DIAGNOSIS — I2510 Atherosclerotic heart disease of native coronary artery without angina pectoris: Secondary | ICD-10-CM | POA: Diagnosis not present

## 2020-05-07 DIAGNOSIS — N183 Chronic kidney disease, stage 3 unspecified: Secondary | ICD-10-CM | POA: Diagnosis not present

## 2020-05-14 DIAGNOSIS — N183 Chronic kidney disease, stage 3 unspecified: Secondary | ICD-10-CM | POA: Diagnosis not present

## 2020-05-14 DIAGNOSIS — I10 Essential (primary) hypertension: Secondary | ICD-10-CM | POA: Diagnosis not present

## 2020-05-14 DIAGNOSIS — Z8673 Personal history of transient ischemic attack (TIA), and cerebral infarction without residual deficits: Secondary | ICD-10-CM | POA: Diagnosis not present

## 2020-05-25 ENCOUNTER — Encounter: Payer: Self-pay | Admitting: Internal Medicine

## 2020-06-09 DIAGNOSIS — H2513 Age-related nuclear cataract, bilateral: Secondary | ICD-10-CM | POA: Diagnosis not present

## 2020-06-12 ENCOUNTER — Other Ambulatory Visit: Payer: Self-pay

## 2020-06-12 DIAGNOSIS — I6522 Occlusion and stenosis of left carotid artery: Secondary | ICD-10-CM

## 2020-06-22 DIAGNOSIS — Z23 Encounter for immunization: Secondary | ICD-10-CM | POA: Diagnosis not present

## 2020-07-08 DIAGNOSIS — H2511 Age-related nuclear cataract, right eye: Secondary | ICD-10-CM | POA: Diagnosis not present

## 2020-07-21 ENCOUNTER — Ambulatory Visit (INDEPENDENT_AMBULATORY_CARE_PROVIDER_SITE_OTHER): Payer: Medicare Other | Admitting: Internal Medicine

## 2020-07-21 ENCOUNTER — Encounter: Payer: Self-pay | Admitting: Internal Medicine

## 2020-07-21 ENCOUNTER — Other Ambulatory Visit: Payer: Self-pay

## 2020-07-21 VITALS — BP 94/69 | HR 94 | Temp 97.1°F | Ht 72.0 in | Wt 178.4 lb

## 2020-07-21 DIAGNOSIS — R159 Full incontinence of feces: Secondary | ICD-10-CM | POA: Diagnosis not present

## 2020-07-21 DIAGNOSIS — R197 Diarrhea, unspecified: Secondary | ICD-10-CM | POA: Diagnosis not present

## 2020-07-21 NOTE — Progress Notes (Signed)
Primary Care Physician:  Carylon Perches, MD Primary Gastroenterologist:  Dr. Marletta Lor  Chief Complaint  Patient presents with   loose stool    Has incontinent episodes x3 years    HPI:   Jerry Rodriguez is a 80 y.o. male who presents to the clinic today by referral from his PCP Dr. Ouida Sills.  Patient is nearly deaf so majority of HPI is taken from his wife who accompanies him.  She states that patient will have intermittent diarrhea a few times a week.  Notes incontinence when this happens.  Can happen at random, not necessarily associated with meals.  Patient does eat fast food 1-2 times daily.  Has not taken any medications for his symptoms.  No abdominal pain.  No melena hematochezia.  Last colonoscopy 2010 without polyps.  No family history of colorectal malignancy.  No unintentional weight loss.    Past Medical History:  Diagnosis Date   Carotid artery occlusion    COPD (chronic obstructive pulmonary disease) (HCC)    Hypertension    Peripheral vascular disease (HCC)    Stroke (HCC)     No past surgical history on file.  Current Outpatient Medications  Medication Sig Dispense Refill   amLODipine (NORVASC) 5 MG tablet Take 5 mg by mouth daily.       aspirin 81 MG tablet Take 81 mg by mouth daily.     atorvastatin (LIPITOR) 20 MG tablet TK 1 T PO QHS  4   ramipril (ALTACE) 5 MG capsule Take 1 capsule by mouth daily.     ramipril (ALTACE) 10 MG capsule TK 1 C PO D (Patient not taking: Reported on 07/21/2020)  4   No current facility-administered medications for this visit.    Allergies as of 07/21/2020   (No Known Allergies)    No family history on file.  Social History   Socioeconomic History   Marital status: Married    Spouse name: Not on file   Number of children: Not on file   Years of education: Not on file   Highest education level: Not on file  Occupational History   Not on file  Tobacco Use   Smoking status: Every Day    Packs/day: 1.00    Years: 60.00     Pack years: 60.00    Types: Cigarettes   Smokeless tobacco: Never  Vaping Use   Vaping Use: Never used  Substance and Sexual Activity   Alcohol use: No    Alcohol/week: 0.0 standard drinks   Drug use: No   Sexual activity: Not on file  Other Topics Concern   Not on file  Social History Narrative   Not on file   Social Determinants of Health   Financial Resource Strain: Not on file  Food Insecurity: Not on file  Transportation Needs: Not on file  Physical Activity: Not on file  Stress: Not on file  Social Connections: Not on file  Intimate Partner Violence: Not on file    Subjective: Review of Systems  Constitutional:  Negative for chills and fever.  HENT:  Positive for hearing loss. Negative for congestion.   Eyes:  Negative for blurred vision and double vision.  Respiratory:  Negative for cough and shortness of breath.   Cardiovascular:  Negative for chest pain and palpitations.  Gastrointestinal:  Positive for diarrhea. Negative for abdominal pain, blood in stool, constipation, heartburn, melena and vomiting.  Genitourinary:  Negative for dysuria and urgency.  Musculoskeletal:  Negative for joint  pain and myalgias.  Skin:  Negative for itching and rash.  Neurological:  Negative for dizziness and headaches.  Psychiatric/Behavioral:  Negative for depression. The patient is not nervous/anxious.       Objective: BP 94/69   Pulse 94   Temp (!) 97.1 F (36.2 C) (Temporal)   Ht 6' (1.829 m)   Wt 178 lb 6.4 oz (80.9 kg)   BMI 24.20 kg/m  Physical Exam Constitutional:      Appearance: Normal appearance.  HENT:     Head: Normocephalic and atraumatic.  Eyes:     Extraocular Movements: Extraocular movements intact.     Conjunctiva/sclera: Conjunctivae normal.  Cardiovascular:     Rate and Rhythm: Normal rate and regular rhythm.  Pulmonary:     Effort: Pulmonary effort is normal.     Breath sounds: Normal breath sounds.  Abdominal:     General: Bowel sounds are  normal.     Palpations: Abdomen is soft.  Musculoskeletal:        General: Normal range of motion.     Cervical back: Normal range of motion and neck supple.  Skin:    General: Skin is warm.  Neurological:     General: No focal deficit present.     Mental Status: He is alert and oriented to person, place, and time.  Psychiatric:        Mood and Affect: Mood normal.        Behavior: Behavior normal.     Assessment: *Diarrhea *Stool incontinence   Plan: I discussed diarrhea and stool incontinence in depth with patient and his wife today.  Likely component of diet related given his fast food eating nearly 2 times daily.  Recommend he decrease amount of fast food in his diet.  I have also recommended he implement fiber with either Metamucil or Benefiber 1-2 times daily.  We will also start him on 1 Imodium every morning and see how he does.  Follow-up in 6 to 8 weeks or sooner if needed.  Thank you Dr. Ouida Sills for the kind referral.  07/21/2020 11:38 AM   Disclaimer: This note was dictated with voice recognition software. Similar sounding words can inadvertently be transcribed and may not be corrected upon review.

## 2020-07-21 NOTE — Patient Instructions (Addendum)
For your loose stools and occasional incontinence, I would add either Benefiber or Metamucil 1-2 times daily.  I would also take a daily Imodium every morning to help prevent loose stools.  Try to decrease the amount of fast food that you eat.  Follow-up in 6 to 8 weeks.  At [facility] we value your feedback. You may receive a survey about your visit today. Please share your experience as we strive to create trusting relationships with our patients to provide genuine, compassionate, quality care.  We appreciate your understanding and patience as we review any laboratory studies, imaging, and other diagnostic tests that are ordered as we care for you. Our office policy is 5 business days for review of these results, and any emergent or urgent results are addressed in a timely manner for your best interest. If you do not hear from our office in 1 week, please contact us.   We also encourage the use of MyChart, which contains your medical information for your review as well. If you are not enrolled in this feature, an access code is on this after visit summary for your convenience. Thank you for allowing [place] to be involved in your care.  It was great to see you today!  I hope you have a great rest of your summer!!    Hennie Duos. Marletta Lor, D.O. Gastroenterology and Hepatology [facility]

## 2020-09-06 DIAGNOSIS — H2512 Age-related nuclear cataract, left eye: Secondary | ICD-10-CM | POA: Diagnosis not present

## 2020-09-09 DIAGNOSIS — H2512 Age-related nuclear cataract, left eye: Secondary | ICD-10-CM | POA: Diagnosis not present

## 2020-09-15 ENCOUNTER — Ambulatory Visit: Payer: Medicare Other | Admitting: Internal Medicine

## 2020-09-15 ENCOUNTER — Encounter: Payer: Self-pay | Admitting: Internal Medicine

## 2020-09-22 ENCOUNTER — Encounter: Payer: Self-pay | Admitting: Internal Medicine

## 2020-10-19 DIAGNOSIS — I6300 Cerebral infarction due to thrombosis of unspecified precerebral artery: Secondary | ICD-10-CM | POA: Diagnosis not present

## 2020-10-19 DIAGNOSIS — E785 Hyperlipidemia, unspecified: Secondary | ICD-10-CM | POA: Diagnosis not present

## 2020-10-19 DIAGNOSIS — N183 Chronic kidney disease, stage 3 unspecified: Secondary | ICD-10-CM | POA: Diagnosis not present

## 2020-10-19 DIAGNOSIS — Z23 Encounter for immunization: Secondary | ICD-10-CM | POA: Diagnosis not present

## 2020-10-19 DIAGNOSIS — I10 Essential (primary) hypertension: Secondary | ICD-10-CM | POA: Diagnosis not present

## 2020-10-19 DIAGNOSIS — Z125 Encounter for screening for malignant neoplasm of prostate: Secondary | ICD-10-CM | POA: Diagnosis not present

## 2020-10-19 DIAGNOSIS — Z79899 Other long term (current) drug therapy: Secondary | ICD-10-CM | POA: Diagnosis not present

## 2020-10-19 DIAGNOSIS — I2510 Atherosclerotic heart disease of native coronary artery without angina pectoris: Secondary | ICD-10-CM | POA: Diagnosis not present

## 2020-10-28 DIAGNOSIS — Z8673 Personal history of transient ischemic attack (TIA), and cerebral infarction without residual deficits: Secondary | ICD-10-CM | POA: Diagnosis not present

## 2020-10-28 DIAGNOSIS — I10 Essential (primary) hypertension: Secondary | ICD-10-CM | POA: Diagnosis not present

## 2020-10-28 DIAGNOSIS — E559 Vitamin D deficiency, unspecified: Secondary | ICD-10-CM | POA: Diagnosis not present

## 2020-10-28 DIAGNOSIS — N1831 Chronic kidney disease, stage 3a: Secondary | ICD-10-CM | POA: Diagnosis not present

## 2020-10-28 DIAGNOSIS — E785 Hyperlipidemia, unspecified: Secondary | ICD-10-CM | POA: Diagnosis not present

## 2020-11-03 ENCOUNTER — Ambulatory Visit: Payer: Medicare Other | Admitting: Internal Medicine

## 2020-11-10 ENCOUNTER — Ambulatory Visit: Payer: Medicare Other | Admitting: Internal Medicine

## 2020-11-10 ENCOUNTER — Ambulatory Visit: Payer: Medicare Other | Admitting: Vascular Surgery

## 2020-11-10 ENCOUNTER — Other Ambulatory Visit (HOSPITAL_COMMUNITY): Payer: Self-pay | Admitting: Vascular Surgery

## 2020-11-10 DIAGNOSIS — I779 Disorder of arteries and arterioles, unspecified: Secondary | ICD-10-CM

## 2021-03-01 DIAGNOSIS — N1831 Chronic kidney disease, stage 3a: Secondary | ICD-10-CM | POA: Diagnosis not present

## 2021-03-01 DIAGNOSIS — E785 Hyperlipidemia, unspecified: Secondary | ICD-10-CM | POA: Diagnosis not present

## 2021-03-01 DIAGNOSIS — Z79899 Other long term (current) drug therapy: Secondary | ICD-10-CM | POA: Diagnosis not present

## 2021-03-01 DIAGNOSIS — I639 Cerebral infarction, unspecified: Secondary | ICD-10-CM | POA: Diagnosis not present

## 2021-03-01 DIAGNOSIS — I10 Essential (primary) hypertension: Secondary | ICD-10-CM | POA: Diagnosis not present

## 2021-03-08 DIAGNOSIS — N1831 Chronic kidney disease, stage 3a: Secondary | ICD-10-CM | POA: Diagnosis not present

## 2021-03-08 DIAGNOSIS — M79641 Pain in right hand: Secondary | ICD-10-CM | POA: Diagnosis not present

## 2021-03-08 DIAGNOSIS — S62306B Unspecified fracture of fifth metacarpal bone, right hand, initial encounter for open fracture: Secondary | ICD-10-CM | POA: Diagnosis not present

## 2021-03-08 DIAGNOSIS — I10 Essential (primary) hypertension: Secondary | ICD-10-CM | POA: Diagnosis not present

## 2021-03-08 DIAGNOSIS — S61401A Unspecified open wound of right hand, initial encounter: Secondary | ICD-10-CM | POA: Diagnosis not present

## 2021-03-09 DIAGNOSIS — S62306B Unspecified fracture of fifth metacarpal bone, right hand, initial encounter for open fracture: Secondary | ICD-10-CM | POA: Diagnosis not present

## 2021-03-15 DIAGNOSIS — S62306B Unspecified fracture of fifth metacarpal bone, right hand, initial encounter for open fracture: Secondary | ICD-10-CM | POA: Diagnosis not present

## 2021-03-17 ENCOUNTER — Emergency Department (HOSPITAL_COMMUNITY): Payer: Medicare Other

## 2021-03-17 ENCOUNTER — Other Ambulatory Visit: Payer: Self-pay

## 2021-03-17 ENCOUNTER — Observation Stay (HOSPITAL_COMMUNITY)
Admission: EM | Admit: 2021-03-17 | Discharge: 2021-03-18 | Disposition: A | Discharge status: Home / Self Care | Payer: Medicare Other | Attending: Internal Medicine | Admitting: Internal Medicine

## 2021-03-17 ENCOUNTER — Encounter (HOSPITAL_COMMUNITY): Payer: Self-pay | Admitting: *Deleted

## 2021-03-17 DIAGNOSIS — E782 Mixed hyperlipidemia: Secondary | ICD-10-CM

## 2021-03-17 DIAGNOSIS — Z720 Tobacco use: Secondary | ICD-10-CM

## 2021-03-17 DIAGNOSIS — F17210 Nicotine dependence, cigarettes, uncomplicated: Secondary | ICD-10-CM | POA: Diagnosis not present

## 2021-03-17 DIAGNOSIS — R260 Ataxic gait: Secondary | ICD-10-CM | POA: Diagnosis not present

## 2021-03-17 DIAGNOSIS — Z8673 Personal history of transient ischemic attack (TIA), and cerebral infarction without residual deficits: Secondary | ICD-10-CM | POA: Diagnosis not present

## 2021-03-17 DIAGNOSIS — R109 Unspecified abdominal pain: Secondary | ICD-10-CM | POA: Diagnosis not present

## 2021-03-17 DIAGNOSIS — G9389 Other specified disorders of brain: Secondary | ICD-10-CM | POA: Diagnosis not present

## 2021-03-17 DIAGNOSIS — I6782 Cerebral ischemia: Secondary | ICD-10-CM | POA: Diagnosis not present

## 2021-03-17 DIAGNOSIS — G319 Degenerative disease of nervous system, unspecified: Secondary | ICD-10-CM | POA: Diagnosis not present

## 2021-03-17 DIAGNOSIS — N39498 Other specified urinary incontinence: Secondary | ICD-10-CM | POA: Diagnosis not present

## 2021-03-17 DIAGNOSIS — N179 Acute kidney failure, unspecified: Secondary | ICD-10-CM

## 2021-03-17 DIAGNOSIS — Z79899 Other long term (current) drug therapy: Secondary | ICD-10-CM | POA: Insufficient documentation

## 2021-03-17 DIAGNOSIS — S61401A Unspecified open wound of right hand, initial encounter: Secondary | ICD-10-CM | POA: Diagnosis not present

## 2021-03-17 DIAGNOSIS — I10 Essential (primary) hypertension: Secondary | ICD-10-CM | POA: Diagnosis not present

## 2021-03-17 DIAGNOSIS — R718 Other abnormality of red blood cells: Secondary | ICD-10-CM

## 2021-03-17 DIAGNOSIS — J3489 Other specified disorders of nose and nasal sinuses: Secondary | ICD-10-CM | POA: Diagnosis not present

## 2021-03-17 DIAGNOSIS — Z9114 Patient's other noncompliance with medication regimen: Secondary | ICD-10-CM | POA: Diagnosis not present

## 2021-03-17 DIAGNOSIS — I129 Hypertensive chronic kidney disease with stage 1 through stage 4 chronic kidney disease, or unspecified chronic kidney disease: Secondary | ICD-10-CM | POA: Diagnosis not present

## 2021-03-17 DIAGNOSIS — Z91148 Patient's other noncompliance with medication regimen for other reason: Secondary | ICD-10-CM

## 2021-03-17 DIAGNOSIS — R29818 Other symptoms and signs involving the nervous system: Secondary | ICD-10-CM | POA: Diagnosis not present

## 2021-03-17 DIAGNOSIS — Z7982 Long term (current) use of aspirin: Secondary | ICD-10-CM | POA: Diagnosis not present

## 2021-03-17 DIAGNOSIS — N189 Chronic kidney disease, unspecified: Secondary | ICD-10-CM | POA: Insufficient documentation

## 2021-03-17 DIAGNOSIS — R197 Diarrhea, unspecified: Secondary | ICD-10-CM | POA: Diagnosis not present

## 2021-03-17 DIAGNOSIS — Z20822 Contact with and (suspected) exposure to covid-19: Secondary | ICD-10-CM | POA: Insufficient documentation

## 2021-03-17 DIAGNOSIS — M4802 Spinal stenosis, cervical region: Secondary | ICD-10-CM | POA: Diagnosis not present

## 2021-03-17 DIAGNOSIS — R531 Weakness: Secondary | ICD-10-CM

## 2021-03-17 DIAGNOSIS — J449 Chronic obstructive pulmonary disease, unspecified: Secondary | ICD-10-CM | POA: Diagnosis not present

## 2021-03-17 LAB — URINALYSIS, ROUTINE W REFLEX MICROSCOPIC
Bacteria, UA: NONE SEEN
Bilirubin Urine: NEGATIVE
Glucose, UA: NEGATIVE mg/dL
Ketones, ur: NEGATIVE mg/dL
Leukocytes,Ua: NEGATIVE
Nitrite: NEGATIVE
Protein, ur: NEGATIVE mg/dL
Specific Gravity, Urine: 1.045 — ABNORMAL HIGH (ref 1.005–1.030)
pH: 5 (ref 5.0–8.0)

## 2021-03-17 LAB — CBC WITH DIFFERENTIAL/PLATELET
Abs Immature Granulocytes: 0.02 10*3/uL (ref 0.00–0.07)
Basophils Absolute: 0.1 10*3/uL (ref 0.0–0.1)
Basophils Relative: 1 %
Eosinophils Absolute: 0 10*3/uL (ref 0.0–0.5)
Eosinophils Relative: 1 %
HCT: 47.2 % (ref 39.0–52.0)
Hemoglobin: 16 g/dL (ref 13.0–17.0)
Immature Granulocytes: 0 %
Lymphocytes Relative: 24 %
Lymphs Abs: 2 10*3/uL (ref 0.7–4.0)
MCH: 34.4 pg — ABNORMAL HIGH (ref 26.0–34.0)
MCHC: 33.9 g/dL (ref 30.0–36.0)
MCV: 101.5 fL — ABNORMAL HIGH (ref 80.0–100.0)
Monocytes Absolute: 0.7 10*3/uL (ref 0.1–1.0)
Monocytes Relative: 8 %
Neutro Abs: 5.8 10*3/uL (ref 1.7–7.7)
Neutrophils Relative %: 66 %
Platelets: 220 10*3/uL (ref 150–400)
RBC: 4.65 MIL/uL (ref 4.22–5.81)
RDW: 13.9 % (ref 11.5–15.5)
WBC: 8.6 10*3/uL (ref 4.0–10.5)
nRBC: 0 % (ref 0.0–0.2)

## 2021-03-17 LAB — COMPREHENSIVE METABOLIC PANEL
ALT: 13 U/L (ref 0–44)
AST: 14 U/L — ABNORMAL LOW (ref 15–41)
Albumin: 3.5 g/dL (ref 3.5–5.0)
Alkaline Phosphatase: 132 U/L — ABNORMAL HIGH (ref 38–126)
Anion gap: 7 (ref 5–15)
BUN: 22 mg/dL (ref 8–23)
CO2: 24 mmol/L (ref 22–32)
Calcium: 8.8 mg/dL — ABNORMAL LOW (ref 8.9–10.3)
Chloride: 107 mmol/L (ref 98–111)
Creatinine, Ser: 1.45 mg/dL — ABNORMAL HIGH (ref 0.61–1.24)
GFR, Estimated: 49 mL/min — ABNORMAL LOW (ref >60)
Glucose, Bld: 94 mg/dL (ref 70–99)
Potassium: 4.3 mmol/L (ref 3.5–5.1)
Sodium: 138 mmol/L (ref 135–145)
Total Bilirubin: 1.1 mg/dL (ref 0.3–1.2)
Total Protein: 7.1 g/dL (ref 6.5–8.1)

## 2021-03-17 LAB — RESP PANEL BY RT-PCR (FLU A&B, COVID) ARPGX2
Influenza A by PCR: NEGATIVE
Influenza B by PCR: NEGATIVE
SARS Coronavirus 2 by RT PCR: NEGATIVE

## 2021-03-17 MED ORDER — IOHEXOL 300 MG/ML  SOLN
100.0000 mL | Freq: Once | INTRAMUSCULAR | Status: AC | PRN
  Administered 2021-03-17: 100 mL via INTRAVENOUS

## 2021-03-17 MED ORDER — SODIUM CHLORIDE 0.9 % IV BOLUS
500.0000 mL | Freq: Once | INTRAVENOUS | Status: AC
  Administered 2021-03-17: 500 mL via INTRAVENOUS

## 2021-03-17 MED ORDER — SODIUM CHLORIDE 0.9 % IV SOLN
INTRAVENOUS | Status: DC
  Administered 2021-03-17: 500 mL via INTRAVENOUS

## 2021-03-17 NOTE — H&P (Signed)
History and Physical    Patient: Jerry Rodriguez NAT:[id number] DOB: 08/22/1940 DOA: 03/17/2021 DOS: the patient was seen and examined on 03/18/2021 PCP: Carylon Perches, MD  Patient coming from: Home  Chief Complaint:  Chief Complaint  Patient presents with   Weakness    HPI: Jerry Rodriguez is a 81 y.o. male with medical history significant of essential hypertension, left carotid stenosis, hyperlipidemia, hearing loss, prior stroke without residual weakness, tobacco abuse, cervical spine fracture at age 69 who presents to the emergency department via EMS due to increased several week onset of generalized weakness, increased difficulty to ambulate and urinary incontinence which worsened within last several days.  Patient was unable to provide history possibly due to hearing loss, history was obtained from ED physician and wife at bedside.  Per wife, patient has had more than 1 month onset of diarrhea, he was referred to gastroenterologist, but he did not comply with medication regimen follow-up with the gastroenterologist.  Patient also has not followed up with Dr. Arbie Cookey in more than a year due to his history of peripheral vascular disease.  Patient was reported to have a good appetite.  There was no report of fever, chills, chest pain, shortness of breath, nausea, vomiting.  ED course: In the emergency department, he was hemodynamically stable.  Work-up in the ED showed normal CBC except for MCV of 101.5, normal BMP except for BUN/creatinine of 22/1.45 (no recent lab for comparison).  Urinalysis was unimpressive for UTI.  Influenza A, B, SARS coronavirus 2 was negative. MRI head without contrast showed no evidence of acute intracranial abnormality. Degenerative pannus at the craniocervical junction which probably deforms the ventral cord at the C1-C2 level with canal stenosis. CT abdomen and pelvis with contrast showed no acute finding in the abdominal pelvis CT of head without contrast showed no  acute intracranial abnormality Chest x-ray showed mild bronchitic changes without focal airspace disease IV hydration was provided. Neurosurgery (Dr. Julien Girt) was consulted by ED physician and recommended MRI of cervical spine with plan for further recommendation based on findings.  Hospitalist was asked to admit patient for further evaluation and management.  Review of Systems: As mentioned in the history of present illness. All other systems reviewed and are negative. Past Medical History:  Diagnosis Date   Carotid artery occlusion    COPD (chronic obstructive pulmonary disease) (HCC)    Hypertension    Peripheral vascular disease (HCC)    Stroke [facility])    History reviewed. No pertinent surgical history. Social History:  reports that he has been smoking cigarettes. He has a 60.00 pack-year smoking history. He has never used smokeless tobacco. He reports that he does not drink alcohol and does not use drugs.  No Known Allergies  History reviewed. No pertinent family history.  Prior to Admission medications   Medication Sig Start Date End Date Taking? Authorizing Provider  acetaminophen (TYLENOL) 325 MG tablet Take 650 mg by mouth every 6 (six) hours as needed.   Yes [provider]  amLODipine (NORVASC) 5 MG tablet Take 5 mg by mouth daily.     Yes [provider]  aspirin 81 MG tablet Take 81 mg by mouth daily.   Yes [provider]  atorvastatin (LIPITOR) 20 MG tablet Take 20 mg by mouth daily. 02/15/15  Yes [provider]  cephALEXin (KEFLEX) 500 MG capsule Take 500 mg by mouth 4 (four) times daily. 03/09/21  Yes [provider]  Pneumococcal 13-Val Conj Vacc (PREVNAR 13  IM) Prevnar 13 (PF)   Yes [provider]  ramipril (ALTACE) 5 MG capsule Take 1 capsule by mouth daily. 06/09/20  Yes [provider]    Physical Exam: Vitals:   03/17/21 2230 03/17/21 2300 03/17/21 2345 03/18/21 0044  BP: (!) 115/58 (!) 102/59 (!)  110/59 113/65  Pulse: 60 62 64 75  Resp: 19 18 19 18   Temp:    (!) 97.5 F (36.4 C)  TempSrc:    Oral  SpO2: 93% 92% 96% 95%   General: Elderly male. Awake, alert, ill appearing, but in no acute distress.  HEENT: NCAT.  PERRLA. EOMI. Sclerae anicteric.  Moist mucosal membranes. Neck: Neck supple without lymphadenopathy. No carotid bruits. No masses palpated.  Cardiovascular: Regular rate with normal S1-S2 sounds. No murmurs, rubs or gallops auscultated. No JVD.  Respiratory: Clear breath sounds.  No accessory muscle use. Abdomen: Soft, nontender, nondistended. Active bowel sounds. No masses or hepatosplenomegaly  Skin: No rashes, lesions, or ulcerations.  Dry, warm to touch. Musculoskeletal:  2+ dorsalis pedis and radial pulses. Good ROM.  No contractures  Psychiatric:  Mood appropriate to current condition. Neurologic: No focal neurological deficits. Strength is 5/5 x 4.    Data Reviewed: There are no new results to review at this time.  Assessment and Plan: * Cervical stenosis of spinal canal- (present on admission) MRI of brain showed degenerative pannus at the craniocervical junction which probably deforms the ventral cord at the C1-C2 level with canal stenosis Neurosurgery was consulted and recommended MRI of the cervical spine Patient will be [facility] due to need for further recommendation/management based on MRI findings  Generalized weakness This may be multifactorial including 1 in 1 month of diarrhea Continue fall precaution and neurochecks Continue protein supplement  Diarrhea Patient was reported to have had diarrhea for more than 1 month, however, he has not had any bowel movement since arrival to the ED. Consider C. Diff and GI stool panel if patient continues to have diarrhea  Noncompliance with medication regimen Patient was advised on importance of being compliant with medication regimen  Tobacco abuse Nicotine patch Patient requires  mobilization   History of CVA (cerebrovascular accident) Continue aspirin and Lipitor Continue fall precaution and neurochecks Continue PT/OT eval and treat  Mixed hyperlipidemia Continue Lipitor  Essential hypertension Continue amlodipine and ramipril  AKI (acute kidney injury) (HCC) BUN/creatinine of 22/1.45 (no recent lab for comparison) Continue gentle hydration Renally adjust medications, avoid nephrotoxic agents/dehydration/hypotension   Elevated MCV MCV 101.5; folate and vitamin B12 levels will be checked    Advance Care Planning: CODE STATUS: Full code  Consults: Neurosurgery  Family Communication: Wife at bedside (all questions answered to satisfaction   Severity of Illness: The appropriate patient status for this patient is INPATIENT. Inpatient status is judged to be reasonable and necessary in order to provide the required intensity of service to ensure the patient's safety. The patient's presenting symptoms, physical exam findings, and initial radiographic and laboratory data in the context of their chronic comorbidities is felt to place them at high risk for further clinical deterioration. Furthermore, it is not anticipated that the patient will be medically stable for discharge from the hospital within 2 midnights of admission.   * I certify that at the point of admission it is my clinical judgment that the patient will require inpatient hospital care spanning beyond 2 midnights from the point of admission due to high intensity of service, high risk for further deterioration and high  frequency of surveillance required.*  Author: Frankey Shown, DO 03/18/2021 1:13 AM  For on call review [email or web address].

## 2021-03-17 NOTE — ED Notes (Signed)
Pt able to move all extremities. Pt is deaf unable to communicate

## 2021-03-17 NOTE — ED Provider Notes (Addendum)
[facility] EMERGENCY DEPARTMENT Provider Note   CSN: [id number] Arrival date & time: 03/17/21  1225     History  Chief Complaint  Patient presents with   Weakness    Jerry Rodriguez is a 81 y.o. male.  Patient followed by Dr. Willey Blade.  Patient brought in for increasing generalized weakness decreased mobility incontinence its been ongoing for several days now.  But seems to have gotten worse in the last week.  His appetite has been excellent according to family members.  No nausea no vomiting.  He has had diarrhea for well over a month now.  Was referred to GI medicine.  But did not go to follow-up.  Also patient known to have peripheral vascular disease.  Followed by Dr. Donnetta Hutching in the past but he has not seen him in over a year as well because he refuses to go.  Past medical history significant for COPD chronic kidney disease hypertension and is still a smoker.  In addition patient had a stroke in 2012. patient does have a degree of confusion as well.  But not necessarily new.  Patient's caregiver is his wife.  Patient is hard of hearing.  All of review of systems obtained from patient's daughter.      Home Medications Prior to Admission medications   Medication Sig Start Date End Date Taking? Authorizing Provider  amLODipine (NORVASC) 5 MG tablet Take 5 mg by mouth daily.      [provider]  aspirin 81 MG tablet Take 81 mg by mouth daily.    [provider]  atorvastatin (LIPITOR) 20 MG tablet TK 1 T PO QHS 02/15/15   [provider]  cephALEXin (KEFLEX) 500 MG capsule Take 500 mg by mouth 4 (four) times daily. 03/09/21   [provider]  Pneumococcal 13-Val Conj Vacc (PREVNAR 13 IM) Prevnar 13 (PF)    [provider]  ramipril (ALTACE) 10 MG capsule TK 1 C PO D Patient not taking: Reported on 07/21/2020 01/05/15   [provider]  ramipril (ALTACE) 5 MG capsule Take 1 capsule by mouth daily. 06/09/20   [provider]       Allergies    Patient has no known allergies.    Review of Systems   Review of Systems  Constitutional:  Positive for activity change. Negative for appetite change, chills and fever.  HENT:  Positive for hearing loss. Negative for ear pain and sore throat.   Eyes:  Negative for pain and visual disturbance.  Respiratory:  Negative for cough and shortness of breath.   Cardiovascular:  Negative for chest pain and palpitations.  Gastrointestinal:  Positive for diarrhea. Negative for abdominal pain and vomiting.  Genitourinary:  Negative for dysuria and hematuria.  Musculoskeletal:  Negative for arthralgias and back pain.  Skin:  Negative for color change and rash.  Neurological:  Positive for weakness. Negative for seizures and syncope.  All other systems reviewed and are negative.  Physical Exam Updated Vital Signs BP 108/75    Pulse 73    Temp 98 F (36.7 C) (Oral)    Resp 16    SpO2 98%  Physical Exam Vitals and nursing note reviewed.  Constitutional:      General: He is not in acute distress.    Appearance: Normal appearance. He is well-developed. He is ill-appearing.  HENT:     Head: Normocephalic and atraumatic.  Eyes:     Conjunctiva/sclera: Conjunctivae normal.  Cardiovascular:     Rate  and Rhythm: Normal rate and regular rhythm.     Heart sounds: No murmur heard. Pulmonary:     Effort: Pulmonary effort is normal. No respiratory distress.     Breath sounds: Normal breath sounds.  Abdominal:     Palpations: Abdomen is soft.     Tenderness: There is no abdominal tenderness.  Musculoskeletal:        General: No swelling.     Cervical back: Normal range of motion and neck supple.     Right lower leg: No edema.     Left lower leg: No edema.  Skin:    General: Skin is warm and dry.     Capillary Refill: Capillary refill takes less than 2 seconds.  Neurological:     Mental Status: He is alert.     Comments: Patient was able to hold both arms up.  Able to hold both  legs up.  Seems to have general strength.  Patient clearly hard of hearing.  Patient follows commands.  Psychiatric:        Mood and Affect: Mood normal.    ED Results / Procedures / Treatments   Labs (all labs ordered are listed, but only abnormal results are displayed) Labs Reviewed  CBC WITH DIFFERENTIAL/PLATELET - Abnormal; Notable for the following components:      Result Value   MCV 101.5 (*)    MCH 34.4 (*)    All other components within normal limits  COMPREHENSIVE METABOLIC PANEL - Abnormal; Notable for the following components:   Creatinine, Ser 1.45 (*)    Calcium 8.8 (*)    AST 14 (*)    Alkaline Phosphatase 132 (*)    GFR, Estimated 49 (*)    All other components within normal limits  RESP PANEL BY RT-PCR (FLU A&B, COVID) ARPGX2  URINALYSIS, ROUTINE W REFLEX MICROSCOPIC    EKG None  Radiology CT Head Wo Contrast  Result Date: 03/17/2021 CLINICAL DATA:  Neuro deficit, acute, stroke suspected Weakness. EXAM: CT HEAD WITHOUT CONTRAST TECHNIQUE: Contiguous axial images were obtained from the base of the skull through the vertex without intravenous contrast. RADIATION DOSE REDUCTION: This exam was performed according to the departmental dose-optimization program which includes automated exposure control, adjustment of the mA and/or kV according to patient size and/or use of iterative reconstruction technique. COMPARISON:  CT 09/16/2010 FINDINGS: Brain: No acute intracranial hemorrhage. There is no evidence of acute ischemia. Moderate-sized area of encephalomalacia in the right temporoparietal lobe at site of infarct on remote 2012 exam. Prominence of the extra-axial spaces in the right hemisphere felt to be due to atrophy rather than subdural hygroma. There is no subdural hematoma. Mild-moderate such that mild periventricular chronic small vessel ischemia Vascular: No hyperdense vessel Skull: No fracture or focal lesion. Sinuses/Orbits: Occasional opacification of lower left  mastoid air cells. Right mastoid air cells are clear. There is no paranasal sinus mucosal thickening. Bilateral lens extraction, no acute orbital findings. Other: None. IMPRESSION: 1. No acute intracranial abnormality. 2. Moderate-sized area of encephalomalacia in the right temporoparietal lobe at site of infarct on remote 2012 exam. 3. Generalized atrophy with mild chronic small vessel ischemia. Electronically Signed   By: Keith Rake M.D.   On: 03/17/2021 17:04   MR Brain Wo Contrast (neuro protocol)  Result Date: 03/17/2021 CLINICAL DATA:  Neuro deficit, acute, stroke suspected EXAM: MRI HEAD WITHOUT CONTRAST TECHNIQUE: Multiplanar, multiecho pulse sequences of the brain and surrounding structures were obtained without intravenous contrast. COMPARISON:  None. FINDINGS: Brain:  No acute infarction, hemorrhage, hydrocephalus, extra-axial collection or mass lesion. Remote right posterior MCA territory infarct with associated encephalomalacia and gliosis. Additional mild for age scattered T2/FLAIR hyperintensities in the white matter, nonspecific by with chronic microvascular ischemic disease. Cerebral atrophy. Vascular: Major arterial flow voids are maintained at the skull base. Skull and upper cervical spine: Normal marrow signal. Degenerative pannus at the craniocervical junction which probably deforms the ventral cord at the C1-C2 level with canal stenosis. Sinuses/Orbits: Mild paranasal sinus mucosal thickening. Unremarkable orbits. Other: No mastoid effusions. IMPRESSION: 1. No evidence of acute intracranial abnormality. 2. Remote right posterior MCA territory infarct. 3. Degenerative pannus at the craniocervical junction which probably deforms the ventral cord at the C1-C2 level with canal stenosis. Consider MRI of the cervical spine to better characterize. Electronically Signed   By: Margaretha Sheffield M.D.   On: 03/17/2021 18:36   CT Abdomen Pelvis W Contrast  Result Date: 03/17/2021 CLINICAL  DATA:  Weakness, generalized abdominal pain EXAM: CT ABDOMEN AND PELVIS WITH CONTRAST TECHNIQUE: Multidetector CT imaging of the abdomen and pelvis was performed using the standard protocol following bolus administration of intravenous contrast. RADIATION DOSE REDUCTION: This exam was performed according to the departmental dose-optimization program which includes automated exposure control, adjustment of the mA and/or kV according to patient size and/or use of iterative reconstruction technique. CONTRAST:  120m OMNIPAQUE IOHEXOL 300 MG/ML  SOLN COMPARISON:  None. FINDINGS: Lower chest: Mild opacities in the lung bases likely reflect subsegmental atelectasis. Coronary artery calcifications are noted. Heart is borderline enlarged. There is trace pericardial fluid. Hepatobiliary: Small hypodense lesions in the liver measuring up to 1.1 cm likely reflects cysts. There are no enhancing lesions. The gallbladder is unremarkable. There is mild prominence of the intrahepatic bile ducts with no obstructing lesion seen. There is no common bile duct dilation. Pancreas: Unremarkable. Spleen: Unremarkable. Adrenals/Urinary Tract: The adrenals are unremarkable. There is asymmetric right renal atrophy, likely related to marked right renal artery stenosis. Multiple hypodense lesions are seen in both kidneys most likely reflecting numerous cysts. There are no stones. There is no hydronephrosis or hydroureter. The bladder is unremarkable. There is excretion of contrast into both collecting systems on the delayed images. Stomach/Bowel: The stomach is unremarkable. There is no evidence of bowel obstruction. There is a mild stool burden in the colon and rectum. There is no abnormal bowel wall thickening or inflammatory change. The appendix is normal. Vascular/Lymphatic: There is extensive soft and calcified atherosclerotic plaque throughout the abdominal aorta resulting in moderate luminal narrowing (5-55). There is mild bulging in the  distal ureter without frank aneurysmal dilation. The right renal artery is markedly hypoenhancing. There is moderate to severe stenosis at the origin of the celiac artery. There is mild stenosis at the origin of the SMA. The IMA is patent. The main portal and splenic veins are patent. There is severe stenosis of the proximal left internal iliac artery (5-66). There is no abdominal or pelvic lymphadenopathy. Reproductive: The prostate and seminal vesicles are unremarkable. Other: There is no ascites or free air. There is a fat containing umbilical hernia. Musculoskeletal: There is no acute osseous abnormality or aggressive osseous lesion. IMPRESSION: 1. No acute finding in the abdomen or pelvis. 2. Extensive arterial atherosclerotic disease with moderate luminal narrowing in the infrarenal abdominal aorta. There is severe stenosis of the right renal artery with significant hypoenhancement, likely accounting for the asymmetric right renal atrophy. Moderate to severe stenosis at the origin of the celiac artery, and severe stenosis  of the proximal left internal iliac artery. Electronically Signed   By: Valetta Mole M.D.   On: 03/17/2021 17:14   DG Chest Port 1 View  Result Date: 03/17/2021 CLINICAL DATA:  Weakness EXAM: PORTABLE CHEST 1 VIEW COMPARISON:  03/12/2007 FINDINGS: Mild diffuse bronchitic changes. No consolidation, pleural effusion, or pneumothorax. Borderline cardiomegaly. IMPRESSION: Mild bronchitic changes without focal airspace disease. Electronically Signed   By: Donavan Foil M.D.   On: 03/17/2021 16:24    Procedures Procedures    Medications Ordered in ED Medications  0.9 %  sodium chloride infusion (500 mLs Intravenous New Bag/Given 03/17/21 1710)  sodium chloride 0.9 % bolus 500 mL (0 mLs Intravenous Stopped 03/17/21 1710)  iohexol (OMNIPAQUE) 300 MG/ML solution 100 mL (100 mLs Intravenous Contrast Given 03/17/21 1634)    ED Course/ Medical Decision Making/ A&P                            Medical Decision Making Amount and/or Complexity of Data Reviewed Radiology: ordered.  Risk Prescription drug management. Decision regarding hospitalization.   Patient on exam without any significant focal weakness.  COVID testing negative CBC normal white blood cell count hemoglobin 16.  Complete metabolic panel and a GFR 49.  LFTs with an alk phos of 132, creatinine elevated at 1.45 but BUN normal at 22.  Calcium little low at 8.8.  Anion gap normal head CT without any acute findings other than the old infarct.  CT scan of the abdomen and pelvis shows significant atherosclerotic disease.  Nothing directly to explain the diarrhea.  Is extensive arterial atherosclerotic disease with moderate luminal narrowing of the infrarenal abdominal aorta there is severe stenosis of the right renal artery with significant hypoenhancement.  Likely accounting for the asymmetric right renal atrophy.  Moderate or severe stenosis at the origin of the celiac artery and severe stenosis at the proximal left internal iliac artery.  Certainly a lot of vascular abnormalities.  As far as those in the abdomen patient seems to eat according to family members without any evidence of any pain or any difficulty.  It is possible maybe the diarrhea could be playing some role with that.  Also with some of the iliac changes.  It is possible that patient could have some claudication abnormalities.  But again by history it is hard to tell.  MRI brain was ordered just to completely rule out any kind of new stroke.  No evidence of any acute intracranial abnormality.  There was a remote right posterior MCA territorial infarct.  There is a degenerative pannus at the craniocervical junction which probably deforms the ventral cord at the C1-C2 level with canal stenosis.  Consider MRI of the cervical spine to better characterize.  Will discuss with neurosurgery.  Maybe patient also needs MRI of the lumbar and thoracic part of the  back as well.  Could consider hospitalist admission and then further evaluation of these areas.  Spoke with Dr. Glenford Peers from neurosurgery.  He reviews the scan.  He recommends MRI cervical spine.  Stating that there is some significant stenosis there and we need to scan to further evaluate.  Could possibly be related to some of his symptoms.  Says is okay for patient to be admitted here hospitalist to get the MRI in the morning and then contact them.  But I will discuss with the hospitalist and then decide whether they want admit here or admit him to [facility].  Final Clinical Impression(s) / ED Diagnoses Final diagnoses:  Weakness  Generalized weakness  Diarrhea, unspecified type  Cervical stenosis of spine    Rx / DC Orders ED Discharge Orders     None         Fredia Sorrow, MD 03/17/21 1925    Fredia Sorrow, MD 03/17/21 2039

## 2021-03-17 NOTE — ED Triage Notes (Signed)
For the past few days has had decreased mobility, and increasing incontinence, referred by PCP

## 2021-03-18 ENCOUNTER — Inpatient Hospital Stay (HOSPITAL_COMMUNITY): Payer: Medicare Other

## 2021-03-18 DIAGNOSIS — Z720 Tobacco use: Secondary | ICD-10-CM

## 2021-03-18 DIAGNOSIS — R531 Weakness: Secondary | ICD-10-CM

## 2021-03-18 DIAGNOSIS — R718 Other abnormality of red blood cells: Secondary | ICD-10-CM

## 2021-03-18 DIAGNOSIS — G9520 Unspecified cord compression: Secondary | ICD-10-CM | POA: Diagnosis not present

## 2021-03-18 DIAGNOSIS — M50221 Other cervical disc displacement at C4-C5 level: Secondary | ICD-10-CM | POA: Diagnosis not present

## 2021-03-18 DIAGNOSIS — E782 Mixed hyperlipidemia: Secondary | ICD-10-CM

## 2021-03-18 DIAGNOSIS — M50223 Other cervical disc displacement at C6-C7 level: Secondary | ICD-10-CM | POA: Diagnosis not present

## 2021-03-18 DIAGNOSIS — R197 Diarrhea, unspecified: Secondary | ICD-10-CM

## 2021-03-18 DIAGNOSIS — M4802 Spinal stenosis, cervical region: Secondary | ICD-10-CM | POA: Diagnosis not present

## 2021-03-18 DIAGNOSIS — I10 Essential (primary) hypertension: Secondary | ICD-10-CM

## 2021-03-18 DIAGNOSIS — N179 Acute kidney failure, unspecified: Secondary | ICD-10-CM

## 2021-03-18 DIAGNOSIS — Z9114 Patient's other noncompliance with medication regimen: Secondary | ICD-10-CM

## 2021-03-18 LAB — COMPREHENSIVE METABOLIC PANEL
ALT: 13 U/L (ref 0–44)
AST: 14 U/L — ABNORMAL LOW (ref 15–41)
Albumin: 3 g/dL — ABNORMAL LOW (ref 3.5–5.0)
Alkaline Phosphatase: 113 U/L (ref 38–126)
Anion gap: 7 (ref 5–15)
BUN: 16 mg/dL (ref 8–23)
CO2: 21 mmol/L — ABNORMAL LOW (ref 22–32)
Calcium: 8.6 mg/dL — ABNORMAL LOW (ref 8.9–10.3)
Chloride: 108 mmol/L (ref 98–111)
Creatinine, Ser: 1.33 mg/dL — ABNORMAL HIGH (ref 0.61–1.24)
GFR, Estimated: 54 mL/min — ABNORMAL LOW (ref >60)
Glucose, Bld: 98 mg/dL (ref 70–99)
Potassium: 4.4 mmol/L (ref 3.5–5.1)
Sodium: 136 mmol/L (ref 135–145)
Total Bilirubin: 0.9 mg/dL (ref 0.3–1.2)
Total Protein: 5.9 g/dL — ABNORMAL LOW (ref 6.5–8.1)

## 2021-03-18 LAB — CBC
HCT: 42.6 % (ref 39.0–52.0)
Hemoglobin: 14.5 g/dL (ref 13.0–17.0)
MCH: 34.2 pg — ABNORMAL HIGH (ref 26.0–34.0)
MCHC: 34 g/dL (ref 30.0–36.0)
MCV: 100.5 fL — ABNORMAL HIGH (ref 80.0–100.0)
Platelets: 201 10*3/uL (ref 150–400)
RBC: 4.24 MIL/uL (ref 4.22–5.81)
RDW: 13.6 % (ref 11.5–15.5)
WBC: 7.1 10*3/uL (ref 4.0–10.5)
nRBC: 0 % (ref 0.0–0.2)

## 2021-03-18 LAB — FOLATE: Folate: 8.7 ng/mL (ref >5.9)

## 2021-03-18 LAB — PHOSPHORUS: Phosphorus: 2.6 mg/dL (ref 2.5–4.6)

## 2021-03-18 LAB — APTT: aPTT: 26 seconds (ref 24–36)

## 2021-03-18 LAB — VITAMIN B12: Vitamin B-12: 137 pg/mL — ABNORMAL LOW (ref 180–914)

## 2021-03-18 LAB — MAGNESIUM: Magnesium: 2.1 mg/dL (ref 1.7–2.4)

## 2021-03-18 MED ORDER — VITAMIN B-12 1000 MCG PO TABS: 1000.0000 ug | ORAL_TABLET | Freq: Every day | ORAL | 0 refills | Status: DC

## 2021-03-18 MED ORDER — LACTINEX PO CHEW: 1.0000 | CHEWABLE_TABLET | Freq: Three times a day (TID) | ORAL | Status: DC

## 2021-03-18 MED ORDER — AMLODIPINE BESYLATE 5 MG PO TABS
5.0000 mg | ORAL_TABLET | Freq: Every day | ORAL | Status: DC
  Administered 2021-03-18: 5 mg via ORAL
  Filled 2021-03-18: qty 1

## 2021-03-18 MED ORDER — HEPARIN SODIUM (PORCINE) 5000 UNIT/ML IJ SOLN
5000.0000 [IU] | Freq: Three times a day (TID) | INTRAMUSCULAR | Status: DC
  Administered 2021-03-18: 5000 [IU] via SUBCUTANEOUS
  Filled 2021-03-18: qty 1

## 2021-03-18 MED ORDER — NICOTINE 21 MG/24HR TD PT24
21.0000 mg | MEDICATED_PATCH | Freq: Every day | TRANSDERMAL | Status: DC
  Administered 2021-03-18: 21 mg via TRANSDERMAL
  Filled 2021-03-18: qty 1

## 2021-03-18 MED ORDER — ATORVASTATIN CALCIUM 10 MG PO TABS
20.0000 mg | ORAL_TABLET | Freq: Every day | ORAL | Status: DC
  Administered 2021-03-18: 20 mg via ORAL
  Filled 2021-03-18: qty 2

## 2021-03-18 MED ORDER — CYANOCOBALAMIN 1000 MCG/ML IJ SOLN
1000.0000 ug | Freq: Every day | INTRAMUSCULAR | Status: DC
Start: 2021-03-18 — End: 2021-03-18
  Filled 2021-03-18: qty 1

## 2021-03-18 MED ORDER — RAMIPRIL 5 MG PO CAPS
5.0000 mg | ORAL_CAPSULE | Freq: Every day | ORAL | Status: DC
  Administered 2021-03-18: 5 mg via ORAL
  Filled 2021-03-18: qty 1

## 2021-03-18 MED ORDER — ENSURE ENLIVE PO LIQD
237.0000 mL | Freq: Two times a day (BID) | ORAL | Status: DC
  Administered 2021-03-18: 237 mL via ORAL

## 2021-03-18 NOTE — Assessment & Plan Note (Addendum)
Continue amlodipine and ramipril

## 2021-03-18 NOTE — Consult Note (Signed)
° °  Providing Compassionate, Quality Care - Together  Neurosurgery Consult  Referring physician: Dr. Benjamine Mola Reason for referral: Cervical stenosis  Chief Complaint: Generalized weakness x 3 weeks  History of Present Illness: History is per daughter as patient is deaf and minimally conversant.  He has had some generalized weakness for about 3 weeks with difficulty walking and diarrhea. He has not complained of weakness focally or dropping objects. He has a history of CVA, HLD, HTN. MRI brain revealed no acute stroke but stenosis at C1-2 due to pannus, MRI c spine obtained at [facility] hospital.  Medications: I have reviewed the patient's current medications. Allergies: No Known Allergies  History reviewed. No pertinent family history. Social History:  has no history on file for tobacco use, alcohol use, and drug use.  ROS: Unable to obtain  Physical Exam:  Vital signs in last 24 hours: Temp:  [98 F (36.7 C)-98.3 F (36.8 C)] 98 F (36.7 C) (07/25 1814) Pulse Rate:  [58-128] 65 (07/26 0746) Resp:  [11-18] 14 (07/26 0217) BP: (138-182)/(65-125) 153/88 (07/26 0700) SpO2:  [91 %-98 %] 96 % (07/26 0746) PE: AA PERRL Face symmetric MAE equally and 4+/5 throughout SILT No Hoffmans No hyperreflexia in BUE/BLE Unable to hear, minimally converses, communication through writing, which he follows commands    Impression/Assessment:  81 yo M with:  C1-2 stenosis  Plan:  -MRI reviewed, slight T2 signal change however he has no signs of UMN dz. I d/w daughter at bedside and recommend close monitoring and f/u outpatient in 4 weeks.  -I showed her the images as well, this is a chronic finding and would not explain his new onset generalized weakness. -rec PT/OT eval  -will f/u outpt   Thank you for allowing me to participate in this patient's care.  Please do not hesitate to call with questions or concerns.   Monia Pouch, DO Neurosurgeon [facility] Cell:  [phone]

## 2021-03-18 NOTE — Assessment & Plan Note (Addendum)
MRI of brain showed degenerative pannus at the craniocervical junction which probably deforms the ventral cord at the C1-C2 level with canal stenosis Neurosurgery was consulted and recommended MRI of the cervical spine: MRI reviewed, slight T2 signal change however he has no signs of UMN dz. I d/w daughter at bedside and recommend close monitoring and f/u outpatient in 4 weeks.  -I showed her the images as well, this is a chronic finding and would not explain his new onset generalized weakness. -outpatient follow up

## 2021-03-18 NOTE — Evaluation (Signed)
Physical Therapy Evaluation Patient Details Name: Jerry Rodriguez MRN: [id number] DOB: February 21, 1940 Today's Date: 03/18/2021  History of Present Illness  81 y.o. male presents to [facility] hospital on 03/17/2021 with generalized weakness, difficulty ambulating, and urinatry incontinence. 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 Mild associated cord T2 hyperintensity. PMH includes CAD, COPD, HTN, PVD, CVA.  Clinical Impression  Pt presents to PT with deficits in balance, gait, endurance, cognition, safety awareness. Pt is impulsive, utilizing UE support of furniture when ambulating to prevent falls. Pt will benefit from continued gait and balance training to reduce falls risk. Pt seems agreeable to considering utilization of a cane for mobility. PT recommends outpatient PT to provide balance and gait training. Pt remains at a high risk for falls, however seems to do what he wants without consideration of safety risk. Pt's daughter reports this is his baseline and likely not going to change.       Recommendations for follow up therapy are one component of a multi-disciplinary discharge planning process, led by the attending physician.  Recommendations may be updated based on patient status, additional functional criteria and insurance authorization.  Follow Up Recommendations Outpatient PT    Assistance Recommended at Discharge Frequent or constant Supervision/Assistance  Patient can return home with the following  A little help with walking and/or transfers;A little help with bathing/dressing/bathroom;Help with stairs or ramp for entrance;Assist for transportation;Direct supervision/assist for medications management;Direct supervision/assist for financial management;Assistance with cooking/housework    Equipment Recommendations  (pt owns a cane, will consider utilizing at home)  Recommendations for Other Services       Functional  Status Assessment Patient has had a recent decline in their functional status and demonstrates the ability to make significant improvements in function in a reasonable and predictable amount of time.     Precautions / Restrictions Precautions Precautions: Fall Precaution Comments: HOH (DEAF) Restrictions Weight Bearing Restrictions: No      Mobility  Bed Mobility Overal bed mobility: Modified Independent             General bed mobility comments: sit to supine    Transfers Overall transfer level: Needs assistance Equipment used: None Transfers: Sit to/from Stand Sit to Stand: Supervision                Ambulation/Gait Ambulation/Gait assistance: Min guard Gait Distance (Feet): 100 Feet Assistive device:  (various furniture items) Gait Pattern/deviations: Step-through pattern, Drifts right/left, Staggering right, Staggering left Gait velocity: functional Gait velocity interpretation: 1.31 - 2.62 ft/sec, indicative of limited community ambulator   General Gait Details: pt with impaired balance, drifting lateraly. Pt with lateral losses of balance, correcting with UE support of next furniture item  Stairs            Wheelchair Mobility    Modified Rankin (Stroke Patients Only)       Balance Overall balance assessment: Needs assistance Sitting-balance support: No upper extremity supported, Feet supported Sitting balance-Leahy Scale: Good     Standing balance support: Single extremity supported, Reliant on assistive device for balance Standing balance-Leahy Scale: Poor                               Pertinent Vitals/Pain Pain Assessment Pain Assessment: No/denies pain    Home Living Family/patient expects to be discharged to:: Private residence Living Arrangements: Spouse/significant other Available Help at Discharge: Family;Available 24 hours/day (spouse) Type of  Home: House       Alternate Level Stairs-Number of Steps:  flight Home Layout: Two level;Bed/bath upstairs   Additional Comments: Home is very full of stuff (significant clutter impacting safety and mobility) NOT open to [facility] therapies    Prior Function Prior Level of Function : History of Falls (last six months);Independent/Modified Independent;Other (comment) (dtr provides history)             Mobility Comments: furniture surfs in the home ADLs Comments: gets assist from wife PRN (she is willing and able)     Hand Dominance        Extremity/Trunk Assessment   Upper Extremity Assessment Upper Extremity Assessment: Defer to OT evaluation    Lower Extremity Assessment Lower Extremity Assessment: Generalized weakness    Cervical / Trunk Assessment Cervical / Trunk Assessment: Normal  Communication   Communication: Deaf;HOH  Cognition Arousal/Alertness: Awake/alert Behavior During Therapy: Impulsive Overall Cognitive Status: Impaired/Different from baseline Area of Impairment: Safety/judgement                         Safety/Judgement: Decreased awareness of safety              General Comments General comments (skin integrity, edema, etc.): VSS on RA, pt very hard of hearing communicating via written communication. Pt ready to go home, does seem agreeable to utilizing a cane but not agreeable to other suggestions during session like wearing socks or home health services.    Exercises     Assessment/Plan    PT Assessment Patient needs continued PT services  PT Problem List Decreased strength;Decreased activity tolerance;Decreased balance;Decreased mobility;Decreased safety awareness;Decreased knowledge of use of DME       PT Treatment Interventions DME instruction;Gait training;Stair training;Functional mobility training;Therapeutic activities;Therapeutic exercise;Balance training;Neuromuscular re-education;Patient/family education;Cognitive remediation    PT Goals (Current goals can be found in the Care Plan  section)  Acute Rehab PT Goals Patient Stated Goal: to go home as soon as possible PT Goal Formulation: With patient Time For Goal Achievement: 04/01/21 Potential to Achieve Goals: Fair Additional Goals Additional Goal #1: Pt will score >19/24 on the DGI to indicate a reduced risk for falls    Frequency Min 3X/week     Co-evaluation               AM-PAC PT "6 Clicks" Mobility  Outcome Measure Help needed turning from your back to your side while in a flat bed without using bedrails?: None Help needed moving from lying on your back to sitting on the side of a flat bed without using bedrails?: None Help needed moving to and from a bed to a chair (including a wheelchair)?: A Little Help needed standing up from a chair using your arms (e.g., wheelchair or bedside chair)?: A Little Help needed to walk in hospital room?: A Little Help needed climbing 3-5 steps with a railing? : Total 6 Click Score: 18    End of Session   Activity Tolerance: Patient tolerated treatment well Patient left: in bed;with call bell/phone within reach;with family/visitor present Nurse Communication: Mobility status PT Visit Diagnosis: Other abnormalities of gait and mobility (R26.89);History of falling (Z91.81)    Time: 1610-9604 PT Time Calculation (min) (ACUTE ONLY): 22 min   Charges:   PT Evaluation $PT Eval Low Complexity: 1 Low          Arlyss Gandy, PT, DPT Acute Rehabilitation Pager: [phone] Office [phone]   Arlyss Gandy 03/18/2021, 10:13  AM

## 2021-03-18 NOTE — Assessment & Plan Note (Addendum)
Patient was reported to have had diarrhea for more than 1 month, however, he has not had any bowel movement since arrival to the ED. -resolved -added lactobacillus while on abx

## 2021-03-18 NOTE — Assessment & Plan Note (Addendum)
B12 low -replete IM x1 -replete PO

## 2021-03-18 NOTE — Discharge Summary (Signed)
Physician Discharge Summary   Patient: Jerry Rodriguez MRN: [id number] DOB: 09/13/1940  Admit date:     03/17/2021  Discharge date: 03/18/21  Discharge Physician: Geradine Girt   PCP: Asencion Noble, MD   Recommendations at discharge:    Outpatient NS Follow B12  Discharge Diagnoses: Principal Problem:   Cervical stenosis of spinal canal Active Problems:   Generalized weakness   Diarrhea   Elevated MCV   AKI (acute kidney injury) ([place])   Essential hypertension   Mixed hyperlipidemia   Tobacco abuse   Noncompliance with medication regimen  Assessment and Plan: * Cervical stenosis of spinal canal- (present on admission) MRI of brain showed degenerative pannus at the craniocervical junction which probably deforms the ventral cord at the C1-C2 level with canal stenosis Neurosurgery was consulted and recommended MRI of the cervical spine: MRI reviewed, slight T2 signal change however he has no signs of UMN dz. I d/w daughter at bedside and recommend close monitoring and f/u outpatient in 4 weeks.  -I showed her the images as well, this is a chronic finding and would not explain his new onset generalized weakness. -outpatient follow up   Noncompliance with medication regimen Patient was advised on importance of being compliant with medication regimen  Tobacco abuse -encourage cessation   Mixed hyperlipidemia Continue Lipitor  Essential hypertension Continue amlodipine and ramipril  AKI (acute kidney injury) ([place]) BUN/creatinine of 22/1.45 (no recent lab for comparison) Improved with hydration -encourage PO intake at home -avoid diarrhea   Elevated MCV B12 low -replete IM x1 -replete PO  Diarrhea Patient was reported to have had diarrhea for more than 1 month, however, he has not had any bowel movement since arrival to the ED. -resolved -added lactobacillus while on abx  Generalized weakness PT/OT- patient to consider outpatient PT       Consultants:  NS  Disposition: Home Diet recommendation:  Discharge Diet Orders (From admission, onward)     Start     Ordered   03/18/21 0000  Diet general        03/18/21 1000           Regular diet  DISCHARGE MEDICATION: Allergies as of 03/18/2021   No Known Allergies      Medication List     TAKE these medications    acetaminophen 325 MG tablet Commonly known as: TYLENOL Take 650 mg by mouth every 6 (six) hours as needed.   amLODipine 5 MG tablet Commonly known as: NORVASC Take 5 mg by mouth daily.   aspirin 81 MG tablet Take 81 mg by mouth daily.   atorvastatin 20 MG tablet Commonly known as: LIPITOR Take 20 mg by mouth daily.   cephALEXin 500 MG capsule Commonly known as: KEFLEX Take 500 mg by mouth 4 (four) times daily.   lactobacillus acidophilus & bulgar chewable tablet Chew 1 tablet by mouth 3 (three) times daily with meals.   PREVNAR 13 IM Prevnar 13 (PF)   ramipril 5 MG capsule Commonly known as: ALTACE Take 1 capsule by mouth daily.   vitamin B-12 1000 MCG tablet Commonly known as: CYANOCOBALAMIN Take 1 tablet (1,000 mcg total) by mouth daily.        Follow-up Information     Asencion Noble, MD Follow up in 1 week(s).   Specialty: Internal Medicine Contact information: [place] [address] [phone]         Dawley, Pieter Partridge C, DO Follow up in 1 month(s).   Contact  information: [address] [place] [address] [phone]         Dawley, Pieter Partridge C, DO Follow up.   Contact information: [address] [place] [address] [phone]                 Discharge Exam: There were no vitals filed for this visit. NAD-- anxious to go home  Condition at discharge: good  The results of significant diagnostics from this hospitalization (including imaging, microbiology, ancillary and laboratory) are listed below for reference.   Imaging Studies: CT Head Wo  Contrast  Result Date: 03/17/2021 CLINICAL DATA:  Neuro deficit, acute, stroke suspected Weakness. EXAM: CT HEAD WITHOUT CONTRAST TECHNIQUE: Contiguous axial images were obtained from the base of the skull through the vertex without intravenous contrast. RADIATION DOSE REDUCTION: This exam was performed according to the departmental dose-optimization program which includes automated exposure control, adjustment of the mA and/or kV according to patient size and/or use of iterative reconstruction technique. COMPARISON:  CT 09/16/2010 FINDINGS: Brain: No acute intracranial hemorrhage. There is no evidence of acute ischemia. Moderate-sized area of encephalomalacia in the right temporoparietal lobe at site of infarct on remote 2012 exam. Prominence of the extra-axial spaces in the right hemisphere felt to be due to atrophy rather than subdural hygroma. There is no subdural hematoma. Mild-moderate such that mild periventricular chronic small vessel ischemia Vascular: No hyperdense vessel Skull: No fracture or focal lesion. Sinuses/Orbits: Occasional opacification of lower left mastoid air cells. Right mastoid air cells are clear. There is no paranasal sinus mucosal thickening. Bilateral lens extraction, no acute orbital findings. Other: None. IMPRESSION: 1. No acute intracranial abnormality. 2. Moderate-sized area of encephalomalacia in the right temporoparietal lobe at site of infarct on remote 2012 exam. 3. Generalized atrophy with mild chronic small vessel ischemia. Electronically Signed   By: Keith Rake M.D.   On: 03/17/2021 17:04   MR Brain Wo Contrast (neuro protocol)  Result Date: 03/17/2021 CLINICAL DATA:  Neuro deficit, acute, stroke suspected EXAM: MRI HEAD WITHOUT CONTRAST TECHNIQUE: Multiplanar, multiecho pulse sequences of the brain and surrounding structures were obtained without intravenous contrast. COMPARISON:  None. FINDINGS: Brain: No acute infarction, hemorrhage, hydrocephalus, extra-axial  collection or mass lesion. Remote right posterior MCA territory infarct with associated encephalomalacia and gliosis. Additional mild for age scattered T2/FLAIR hyperintensities in the white matter, nonspecific by with chronic microvascular ischemic disease. Cerebral atrophy. Vascular: Major arterial flow voids are maintained at the skull base. Skull and upper cervical spine: Normal marrow signal. Degenerative pannus at the craniocervical junction which probably deforms the ventral cord at the C1-C2 level with canal stenosis. Sinuses/Orbits: Mild paranasal sinus mucosal thickening. Unremarkable orbits. Other: No mastoid effusions. IMPRESSION: 1. No evidence of acute intracranial abnormality. 2. Remote right posterior MCA territory infarct. 3. Degenerative pannus at the craniocervical junction which probably deforms the ventral cord at the C1-C2 level with canal stenosis. Consider MRI of the cervical spine to better characterize. Electronically Signed   By: Margaretha Sheffield M.D.   On: 03/17/2021 18:36   MR CERVICAL SPINE WO CONTRAST  Result Date: 03/18/2021 CLINICAL DATA:  Cervical spinal stenosis EXAM: MRI CERVICAL SPINE WITHOUT CONTRAST TECHNIQUE: Multiplanar, multisequence MR imaging of the cervical spine was performed. No intravenous contrast was administered. COMPARISON:  Brain MRI from yesterday FINDINGS: Alignment: Physiologic. Vertebrae: Odontoid ossicle with cleft like area in the bony gap that expands posteriorly where there is also nodular hypointense thickening, likely ligamentous thickening with ganglion. The  anterior arch of C1 appears bifid on sagittal images, consider CT correlation. No acute fracture, discitis, or aggressive bone lesion Cord: The above described ligamentous thickening/ganglion flattens the upper cord to 5 mm. Mild T2 hyperintensity in the cord above the level of compression. Posterior Fossa, vertebral arteries, paraspinal tissues: No perispinal mass or inflammation Disc levels:  C2-3: Small central disc protrusion C3-4: Disc narrowing and bulging with uncovertebral and facet spurring. Moderate left foraminal impingement C4-5: Disc narrowing and bulging with uncovertebral and facet spurring. Moderate bilateral foraminal impingement that is greater on the right C5-6: Disc narrowing and bulging with uncovertebral ridging and biforaminal impingement. C6-7: Disc narrowing and bulging with uncovertebral spurring. Left foraminal impingement. C7-T1:Disc narrowing and bulging with eccentric right uncovertebral spurring and underlying disc protrusion. Mild right facet spurring. Moderate right foraminal stenosis IMPRESSION: 1. Odontoid ossicle with posterior ligamentous thickening and ganglion compressing the cord. Mild associated cord T2 hyperintensity. 2. Cervical spine degeneration with foraminal narrowings described above. Electronically Signed   By: Jorje Guild M.D.   On: 03/18/2021 06:46   CT Abdomen Pelvis W Contrast  Result Date: 03/17/2021 CLINICAL DATA:  Weakness, generalized abdominal pain EXAM: CT ABDOMEN AND PELVIS WITH CONTRAST TECHNIQUE: Multidetector CT imaging of the abdomen and pelvis was performed using the standard protocol following bolus administration of intravenous contrast. RADIATION DOSE REDUCTION: This exam was performed according to the departmental dose-optimization program which includes automated exposure control, adjustment of the mA and/or kV according to patient size and/or use of iterative reconstruction technique. CONTRAST:  134mL OMNIPAQUE IOHEXOL 300 MG/ML  SOLN COMPARISON:  None. FINDINGS: Lower chest: Mild opacities in the lung bases likely reflect subsegmental atelectasis. Coronary artery calcifications are noted. Heart is borderline enlarged. There is trace pericardial fluid. Hepatobiliary: Small hypodense lesions in the liver measuring up to 1.1 cm likely reflects cysts. There are no enhancing lesions. The gallbladder is unremarkable. There is mild  prominence of the intrahepatic bile ducts with no obstructing lesion seen. There is no common bile duct dilation. Pancreas: Unremarkable. Spleen: Unremarkable. Adrenals/Urinary Tract: The adrenals are unremarkable. There is asymmetric right renal atrophy, likely related to marked right renal artery stenosis. Multiple hypodense lesions are seen in both kidneys most likely reflecting numerous cysts. There are no stones. There is no hydronephrosis or hydroureter. The bladder is unremarkable. There is excretion of contrast into both collecting systems on the delayed images. Stomach/Bowel: The stomach is unremarkable. There is no evidence of bowel obstruction. There is a mild stool burden in the colon and rectum. There is no abnormal bowel wall thickening or inflammatory change. The appendix is normal. Vascular/Lymphatic: There is extensive soft and calcified atherosclerotic plaque throughout the abdominal aorta resulting in moderate luminal narrowing (5-55). There is mild bulging in the distal ureter without frank aneurysmal dilation. The right renal artery is markedly hypoenhancing. There is moderate to severe stenosis at the origin of the celiac artery. There is mild stenosis at the origin of the SMA. The IMA is patent. The main portal and splenic veins are patent. There is severe stenosis of the proximal left internal iliac artery (5-66). There is no abdominal or pelvic lymphadenopathy. Reproductive: The prostate and seminal vesicles are unremarkable. Other: There is no ascites or free air. There is a fat containing umbilical hernia. Musculoskeletal: There is no acute osseous abnormality or aggressive osseous lesion. IMPRESSION: 1. No acute finding in the abdomen or pelvis. 2. Extensive arterial atherosclerotic disease with moderate luminal narrowing in the infrarenal abdominal aorta. There is severe  stenosis of the right renal artery with significant hypoenhancement, likely accounting for the asymmetric right renal  atrophy. Moderate to severe stenosis at the origin of the celiac artery, and severe stenosis of the proximal left internal iliac artery. Electronically Signed   By: Valetta Mole M.D.   On: 03/17/2021 17:14   DG Chest Port 1 View  Result Date: 03/17/2021 CLINICAL DATA:  Weakness EXAM: PORTABLE CHEST 1 VIEW COMPARISON:  03/12/2007 FINDINGS: Mild diffuse bronchitic changes. No consolidation, pleural effusion, or pneumothorax. Borderline cardiomegaly. IMPRESSION: Mild bronchitic changes without focal airspace disease. Electronically Signed   By: Donavan Foil M.D.   On: 03/17/2021 16:24    Microbiology: Results for orders placed or performed during the hospital encounter of 03/17/21  Resp Panel by RT-PCR (Flu A&B, Covid) Nasopharyngeal Swab     Status: None   Collection Time: 03/17/21  4:09 PM   Specimen: Nasopharyngeal Swab; Nasopharyngeal(NP) swabs in vial transport medium  Result Value Ref Range Status   SARS Coronavirus 2 by RT PCR NEGATIVE NEGATIVE Final    Comment: (NOTE) SARS-CoV-2 target nucleic acids are NOT DETECTED.  The SARS-CoV-2 RNA is generally detectable in upper respiratory specimens during the acute phase of infection. The lowest concentration of SARS-CoV-2 viral copies this assay can detect is 138 copies/mL. A negative result does not preclude SARS-Cov-2 infection and should not be used as the sole basis for treatment or other patient management decisions. A negative result may occur with  improper specimen collection/handling, submission of specimen other than nasopharyngeal swab, presence of viral mutation(s) within the areas targeted by this assay, and inadequate number of viral copies(<138 copies/mL). A negative result must be combined with clinical observations, patient history, and epidemiological information. The expected result is Negative.  Fact Sheet for Patients:  EntrepreneurPulse.com.au  Fact Sheet for Healthcare Providers:   IncredibleEmployment.be  This test is no t yet approved or cleared by the [place] FDA and  has been authorized for detection and/or diagnosis of SARS-CoV-2 by FDA under an Emergency Use Authorization (EUA). This EUA will remain  in effect (meaning this test can be used) for the duration of the COVID-19 declaration under Section 564(b)(1) of the Act, 21 U.S.C.section 360bbb-3(b)(1), unless the authorization is terminated  or revoked sooner.       Influenza A by PCR NEGATIVE NEGATIVE Final   Influenza B by PCR NEGATIVE NEGATIVE Final    Comment: (NOTE) The Xpert Xpress SARS-CoV-2/FLU/RSV plus assay is intended as an aid in the diagnosis of influenza from Nasopharyngeal swab specimens and should not be used as a sole basis for treatment. Nasal washings and aspirates are unacceptable for Xpert Xpress SARS-CoV-2/FLU/RSV testing.  Fact Sheet for Patients: EntrepreneurPulse.com.au  Fact Sheet for Healthcare Providers: IncredibleEmployment.be  This test is not yet approved or cleared by the [place] FDA and has been authorized for detection and/or diagnosis of SARS-CoV-2 by FDA under an Emergency Use Authorization (EUA). This EUA will remain in effect (meaning this test can be used) for the duration of the COVID-19 declaration under Section 564(b)(1) of the Act, 21 U.S.C. section 360bbb-3(b)(1), unless the authorization is terminated or revoked.  Performed at [facility], [address] [place], [place] [address]     Labs: CBC: Recent Labs  Lab 03/17/21 1332 03/18/21 0729  WBC 8.6 7.1  NEUTROABS 5.8  --   HGB 16.0 14.5  HCT 47.2 42.6  MCV 101.5* 100.5*  PLT 220 [id number]   Basic Metabolic Panel: Recent Labs  Lab 03/17/21  1332 03/18/21 0729  NA 138 136  K 4.3 4.4  CL 107 108  CO2 24 21*  GLUCOSE 94 98  BUN 22 16  CREATININE 1.45* 1.33*  CALCIUM 8.8* 8.6*  MG  --  2.1  PHOS  --  2.6   Liver Function  Tests: Recent Labs  Lab 03/17/21 1332 03/18/21 0729  AST 14* 14*  ALT 13 13  ALKPHOS 132* 113  BILITOT 1.1 0.9  PROT 7.1 5.9*  ALBUMIN 3.5 3.0*   CBG: No results for input(s): GLUCAP in the last 168 hours.  Discharge time spent: greater than 30 minutes.  Signed: Geradine Girt, DO [facility] 03/18/2021

## 2021-03-18 NOTE — Assessment & Plan Note (Signed)
Patient was advised on importance of being compliant with medication regimen

## 2021-03-18 NOTE — Assessment & Plan Note (Signed)
Continue aspirin and Lipitor Continue fall precaution and neurochecks Continue PT/OT eval and treat

## 2021-03-18 NOTE — Assessment & Plan Note (Signed)
-  Continue Lipitor °

## 2021-03-18 NOTE — Assessment & Plan Note (Addendum)
BUN/creatinine of 22/1.45 (no recent lab for comparison) Improved with hydration -encourage PO intake at home -avoid diarrhea

## 2021-03-18 NOTE — Care Management Obs Status (Signed)
[facility] NOTIFICATION   Patient Details  Name: Jerry Rodriguez MRN: [id number] Date of Birth: 18-Sep-1940   Medicare Observation Status Notification Given:  Yes    Angelita Ingles, RN 03/18/2021, 11:41 AM

## 2021-03-18 NOTE — Progress Notes (Signed)
OT Evaluation  Clinical Impression: Pt is typically Mod I at home - furniture surfs, does his own ADL (wife is willing and able). Pt today was mod A for mobility (very unsafe with RW) for balance, reaching out for environmental support instead of using RW right infront of him. He was able to perform toilet transfer, peri care from seated position, declined hand washing/grooming at sink and donning socks. Daughter present throughout session, filling in background/home set up. Pt is functioning at baseline with decreased safety awareness for falls. OT provided suggestions for bathroom safety (see ADL section below) OT education complete at this time. PT/daughter declining further OT at the [facility] level.    03/18/21 0900  OT Visit Information  Last OT Received On 03/18/21  Assistance Needed +1  History of Present Illness 81 y.o. male presents to [facility] hospital on 03/17/2021 with generalized weakness, difficulty ambulating, and urinatry incontinence. 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 Mild associated cord T2 hyperintensity. PMH includes CAD, COPD, HTN, PVD, CVA.  Precautions  Precautions Fall  Precaution Comments HOH (DEAF)  Restrictions  Weight Bearing Restrictions No  Home Living  Family/patient expects to be discharged to: Private residence  Living Arrangements Spouse/significant other  Available Help at Discharge Family;Available 24 hours/day (wife)  Type of Home House  Home Layout Two level;Bed/bath upstairs (working on [employer] fundraiser)  Alternate Level Stairs-Number of Steps flight  Bathroom Shower/Tub Tub/shower unit;Walk-in shower (very small walk in)  [employer] Handicapped height  Bathroom Accessibility No  Additional Comments Home is very full of stuff (significant clutter impacting safety and mobility) NOT open to [facility] therapies  Prior Function  Prior Level of Function  History of Falls (last six  months);Independent/Modified Independent;Other (comment) (Daughter providing history as Pt def/HOH)  Mobility Comments furniture surfs in the home  ADLs Comments gets assist from wife PRN (she is willing and able)  Communication  Communication Deaf;HOH  Pain Assessment  Pain Assessment Faces  Pain Score 0  Pain Intervention(s) Monitored during session;Repositioned  Cognition  Arousal/Alertness Awake/alert  Behavior During Therapy Impulsive  Overall Cognitive Status Impaired/Different from baseline  Area of Impairment Following commands;Safety/judgement  Following Commands Follows one step commands consistently  Safety/Judgement Decreased awareness of safety;Decreased awareness of deficits  General Comments Pt at baseline for safety awareness. Daughter is present and confirms that "this is how he is"  Upper Extremity Assessment  Upper Extremity Assessment Overall WFL for tasks assessed  Lower Extremity Assessment  Lower Extremity Assessment Defer to PT evaluation  ADL  Overall ADL's  At baseline  General ADL Comments Pt ambulated VERY unsafely to bathroom, able to complete peri care, refused to don socks or complete standing grooming. yelling at therapist "God D@m * it!" Daughter reports that this is baseline. His wife is willing and able to assist with LB ADL if he needs it. Discussed DME options to make the bathroom safer for patient. Daughter is looking into shower chair - OT suggested suction grab bar as the small walk in shower is pre-fab without tiles  - might be safer if the shower is too small for a chair.  Bed Mobility  Overal bed mobility Needs Assistance  Bed Mobility Supine to Sit  Supine to sit Min assist  General bed mobility comments reached out to therapist to elevate trunk  Transfers  Overall transfer level Needs assistance  Equipment used Rolling walker (2 wheels)  Transfers Sit to/from Stand  Sit to [employer]  guard  General transfer comment does not use RW safely,  despite attempts to visually cue  Balance  Overall balance assessment Needs assistance  Sitting-balance support No upper extremity supported;Feet supported  Sitting balance-Leahy Scale Good  Standing balance support Single extremity supported  Standing balance-Leahy Scale Poor  General Comments  General comments (skin integrity, edema, etc.) VSS on RA  OT - End of Session  Equipment Utilized During Treatment Rolling walker (2 wheels)  Activity Tolerance Patient tolerated treatment well  Patient left in chair;with chair alarm set;with family/visitor present  Nurse Communication Mobility status  OT Assessment  OT Recommendation/Assessment Patient does not need any further OT services (Pt declines all further OT that might increase safety/prevent falls)  OT Visit Diagnosis Unsteadiness on feet (R26.81);History of falling (Z91.81);Other symptoms and signs involving cognitive function  OT Problem List Impaired balance (sitting and/or standing);Decreased safety awareness  AM-PAC OT "6 Clicks" Daily Activity Outcome Measure (Version 2)  Help from another person eating meals? 4  Help from another person taking care of personal grooming? 3  Help from another person toileting, which includes using toliet, bedpan, or urinal? 2  Help from another person bathing (including washing, rinsing, drying)? 3  Help from another person to put on and taking off regular upper body clothing? 3  Help from another person to put on and taking off regular lower body clothing? 3  6 Click Score 18  Progressive Mobility  What is the highest level of mobility based on the progressive mobility assessment? Level 4 (Walks with assist in room) - Balance while marching in place and cannot step forward and back - Complete  Activity Ambulated with assistance to bathroom  OT Recommendation  Recommendations for Other Services PT consult  Follow Up Recommendations No OT follow up  Assistance recommended at discharge  Intermittent Supervision/Assistance  Patient can return home with the following A lot of help with walking and/or transfers;A little help with bathing/dressing/bathroom;Assistance with cooking/housework;Assistance with feeding;Help with stairs or ramp for entrance  Functional Status Assessent Patient has had a recent decline in their functional status and/or demonstrates limited ability to make significant improvements in function in a reasonable and predictable amount of time  OT Equipment Tub/shower seat  Individuals Consulted  Consulted and Agree with Results and Recommendations Patient;Family member/caregiver  Family Member Consulted Daughter  Acute Rehab OT Goals  Patient Stated Goal get home  OT Goal Formulation With patient/family  Time For Goal Achievement 04/01/21  Potential to Achieve Goals Fair  OT Time Calculation  OT Start Time (ACUTE ONLY) 0909  OT Stop Time (ACUTE ONLY) 0931  OT Time Calculation (min) 22 min  OT General Charges  $OT Visit 1 Visit  OT Evaluation  $OT Eval Low Complexity 1 Low   Nyoka Cowden OTR/L Acute Rehabilitation Services Pager: [phone] Office: [phone]

## 2021-03-18 NOTE — Assessment & Plan Note (Addendum)
PT/OT- patient to consider outpatient PT

## 2021-03-18 NOTE — Assessment & Plan Note (Addendum)
encourage cessation 

## 2021-03-18 NOTE — Care Management CC44 (Signed)
Condition Code 44 Documentation Completed  Patient Details  Name: Jerry Rodriguez MRN: [id number] Date of Birth: [birth year]   Condition Code 44 given:  Yes Patient signature on Condition Code 44 notice:  Yes Documentation of 2 MD's agreement:  Yes Code 44 added to claim:  Yes    Beckie Busing, RN 03/18/2021, 11:41 AM

## 2021-03-18 NOTE — TOC Initial Note (Signed)
Transition of Care [facility]) - Initial/Assessment Note    Patient Details  Name: Jerry Rodriguez MRN: [id number] Date of Birth: March 05, 1940  Transition of Care [facility]) CM/SW Contact:    Beckie Busing, RN Phone Number:[phone]  03/18/2021, 9:05 AM  Clinical Narrative:                  Transition of Care [facility]) Screening Note   Patient Details  Name: Jerry Rodriguez Date of Birth: [birth year]   Transition of Care [facility]) CM/SW Contact:    Beckie Busing, RN Phone Number: 03/18/2021, 9:06 AM    Transition of Care Department [facility]) has reviewed patient and no TOC needs have been identified at this time. We will continue to monitor patient advancement through interdisciplinary progression rounds. TOC acknowledges that patient has been admitted with increasing weakness and may have TOC needs. TOC following.         Patient Goals and CMS Choice        Expected Discharge Plan and Services                                                Prior Living Arrangements/Services                       Activities of Daily Living   ADL Screening (condition at time of admission) Patient's cognitive ability adequate to safely complete daily activities?: No Is the patient deaf or have difficulty hearing?: Yes Does the patient have difficulty seeing, even when wearing glasses/contacts?: No Does the patient have difficulty concentrating, remembering, or making decisions?: Yes Patient able to express need for assistance with ADLs?: Yes Does the patient have difficulty dressing or bathing?: Yes Independently performs ADLs?: No Communication: Needs assistance Does the patient have difficulty walking or climbing stairs?: Yes Weakness of Legs: Both Weakness of Arms/Hands: Right  Permission Sought/Granted                  Emotional Assessment              Admission diagnosis:  Weakness [R53.1] Cervical stenosis of spinal canal [M48.02] Cervical stenosis of  spine [M48.02] Generalized weakness [R53.1] Diarrhea, unspecified type [R19.7] Patient Active Problem List   Diagnosis Date Noted   Generalized weakness 03/18/2021   Diarrhea 03/18/2021   Elevated MCV 03/18/2021   AKI (acute kidney injury) (HCC) 03/18/2021   Essential hypertension 03/18/2021   Mixed hyperlipidemia 03/18/2021   History of CVA (cerebrovascular accident) 03/18/2021   Tobacco abuse 03/18/2021   Noncompliance with medication regimen 03/18/2021   Cervical stenosis of spinal canal 03/17/2021   HEMOCCULT POSITIVE STOOL 04/20/2008   PCP:  Carylon Perches, MD Pharmacy:   [facility] - [place], [place] - [phone] [place] [address] Phone: [phone] Fax: [phone]     Social Determinants of Health (SDOH) Interventions    Readmission Risk Interventions No flowsheet data found.

## 2021-03-23 DIAGNOSIS — Z20822 Contact with and (suspected) exposure to covid-19: Secondary | ICD-10-CM | POA: Diagnosis not present

## 2021-03-25 DIAGNOSIS — D519 Vitamin B12 deficiency anemia, unspecified: Secondary | ICD-10-CM | POA: Diagnosis not present

## 2021-03-25 DIAGNOSIS — M4802 Spinal stenosis, cervical region: Secondary | ICD-10-CM | POA: Diagnosis not present

## 2021-03-29 DIAGNOSIS — S62306B Unspecified fracture of fifth metacarpal bone, right hand, initial encounter for open fracture: Secondary | ICD-10-CM | POA: Diagnosis not present

## 2021-06-30 DIAGNOSIS — Z79899 Other long term (current) drug therapy: Secondary | ICD-10-CM | POA: Diagnosis not present

## 2021-06-30 DIAGNOSIS — N1831 Chronic kidney disease, stage 3a: Secondary | ICD-10-CM | POA: Diagnosis not present

## 2021-06-30 DIAGNOSIS — F0390 Unspecified dementia without behavioral disturbance: Secondary | ICD-10-CM | POA: Diagnosis not present

## 2021-07-07 DIAGNOSIS — M4802 Spinal stenosis, cervical region: Secondary | ICD-10-CM | POA: Diagnosis not present

## 2021-07-07 DIAGNOSIS — S30861A Insect bite (nonvenomous) of abdominal wall, initial encounter: Secondary | ICD-10-CM | POA: Diagnosis not present

## 2021-07-07 DIAGNOSIS — N1831 Chronic kidney disease, stage 3a: Secondary | ICD-10-CM | POA: Diagnosis not present

## 2021-09-08 ENCOUNTER — Telehealth: Payer: Self-pay | Admitting: *Deleted

## 2021-09-09 NOTE — Patient Outreach (Signed)
  Care Coordination   09/08/2021 Name: Jerry Rodriguez MRN: [id number] DOB: 12/27/1940   Care Coordination Outreach Attempts:  An unsuccessful telephone outreach was attempted today to offer the patient information about available care coordination services as a benefit of their health plan.   Follow Up Plan:  Additional outreach attempts will be made to offer the patient care coordination information and services.   Encounter Outcome:  No Answer  Care Coordination Interventions Activated:  No   Care Coordination Interventions:  No, not indicated    Demetrios Loll, BSN, RN-BC RN Care Coordinator Direct Dial: [phone]

## 2021-09-23 ENCOUNTER — Telehealth: Payer: Self-pay | Admitting: *Deleted

## 2021-09-23 NOTE — Patient Outreach (Signed)
  Care Coordination   09/23/2021 Name: Jerry Rodriguez MRN: [id number] DOB: February 16, 1940   Care Coordination Outreach Attempts:  A second unsuccessful outreach was attempted today to offer the patient with information about available care coordination services as a benefit of their health plan.     Follow Up Plan:  Additional outreach attempts will be made to offer the patient care coordination information and services.   Encounter Outcome:  No Answer  Care Coordination Interventions Activated:  No   Care Coordination Interventions:  No, not indicated    Kemper Durie, RN, MSN, [facility] Care Management Care Management Coordinator [phone]

## 2021-09-28 ENCOUNTER — Encounter: Payer: Self-pay | Admitting: *Deleted

## 2021-09-28 ENCOUNTER — Telehealth: Payer: Self-pay | Admitting: *Deleted

## 2021-09-28 NOTE — Patient Outreach (Signed)
  Care Coordination   Initial Visit Note   09/28/2021 Name: Jerry Rodriguez MRN: [id number] DOB: 1940-12-07  Jerry Rodriguez is a 81 y.o. year old male who sees Carylon Perches, MD for primary care. I spoke with Elease Hashimoto, wife of  Jerry Rodriguez by phone today.  What matters to the patients health and wellness today?  Per wife, member was seen by PCP in June for overall physical decline.  Had MRI, member's body is reportedly "full of plaque."  She state member has smoked all his life, now feel he is at the end of life as providers told her there is no cure for current condition.  She has not discussed next steps with PCP,  but will during her visit with him next week.  She is aware of CSW availability for additional services if needed.  State she is ok right now but may need the services in the future.    Goals Addressed               This Visit's Progress     Per wife - manage patient's "decline" (pt-stated)        Care Coordination Interventions: Evaluation of current treatment plan related to Overall decline and patient's adherence to plan as established by provider Advised patient to contact RNCM for CSW assistance when ready for have patient placed or increased need for additional assistance in the home Reviewed scheduled/upcoming provider appointments including follow up with PCP in November Advised patient to discuss goals of care (possible palliative care and/or hospice) with provider Assessed social determinant of health barriers         SDOH assessments and interventions completed:  Yes  SDOH Interventions Today    Flowsheet Row Most Recent Value  SDOH Interventions   Food Insecurity Interventions Intervention Not Indicated  Housing Interventions Intervention Not Indicated  Transportation Interventions Intervention Not Indicated  Utilities Interventions Intervention Not Indicated        Care Coordination Interventions Activated:  Yes  Care Coordination  Interventions:  Yes, provided   Follow up plan: Follow up call scheduled for 10/5    Encounter Outcome:  Pt. Visit Completed   Kemper Durie, RN, MSN, [facility] Care Management Care Management Coordinator [phone]

## 2021-09-28 NOTE — Patient Instructions (Signed)
Visit Information  Thank you for taking time to visit with me today. Please don't hesitate to contact me if I can be of assistance to you before our next scheduled telephone appointment.  Following are the goals we discussed today:  Speak with PCP about palliative care and/or hospice services.   Consider additional in home assistance.   Our next appointment is by telephone on 10/5   Please call the care guide team at [phone] if you need to cancel or reschedule your appointment.   Please call the Suicide and Crisis Lifeline: 988 call the [place] National Suicide Prevention Lifeline: 763-786-6636 or TTY: 862-044-3579 TTY 224-050-2398) to talk to a trained counselor call 1-800-273-TALK (toll free, 24 hour hotline) call the [facility]: [phone] call 911 if you are experiencing a Mental Health or Behavioral Health Crisis or need someone to talk to.  Patient verbalizes understanding of instructions and care plan provided today and agrees to view in MyChart. Active MyChart status and patient understanding of how to access instructions and care plan via MyChart confirmed with patient.     The patient has been provided with contact information for the care management team and has been advised to call with any health related questions or concerns.   Kemper Durie, RN, MSN, [facility] Care Management Care Management Coordinator [phone]

## 2021-10-27 ENCOUNTER — Ambulatory Visit: Payer: Self-pay | Admitting: *Deleted

## 2021-10-27 NOTE — Patient Outreach (Signed)
  Care Coordination   10/27/2021 Name: DAOUD LOBUE MRN: [id number] DOB: 1940-10-29   Care Coordination Outreach Attempts:  An unsuccessful telephone outreach was attempted for a scheduled appointment today.  Follow Up Plan:  Additional outreach attempts will be made to offer the patient care coordination information and services.   Encounter Outcome:  No Answer  Care Coordination Interventions Activated:  No   Care Coordination Interventions:  No, not indicated    Chong Sicilian, BSN, RN-BC RN Care Coordinator [place]: [phone] Main #: [phone]

## 2021-11-01 DIAGNOSIS — I639 Cerebral infarction, unspecified: Secondary | ICD-10-CM | POA: Diagnosis not present

## 2021-11-01 DIAGNOSIS — G3184 Mild cognitive impairment, so stated: Secondary | ICD-10-CM | POA: Diagnosis not present

## 2021-11-01 DIAGNOSIS — D519 Vitamin B12 deficiency anemia, unspecified: Secondary | ICD-10-CM | POA: Diagnosis not present

## 2021-11-01 DIAGNOSIS — Z79899 Other long term (current) drug therapy: Secondary | ICD-10-CM | POA: Diagnosis not present

## 2021-11-01 DIAGNOSIS — Z125 Encounter for screening for malignant neoplasm of prostate: Secondary | ICD-10-CM | POA: Diagnosis not present

## 2021-11-01 DIAGNOSIS — N1831 Chronic kidney disease, stage 3a: Secondary | ICD-10-CM | POA: Diagnosis not present

## 2021-11-24 DIAGNOSIS — E785 Hyperlipidemia, unspecified: Secondary | ICD-10-CM | POA: Diagnosis not present

## 2021-11-24 DIAGNOSIS — N1831 Chronic kidney disease, stage 3a: Secondary | ICD-10-CM | POA: Diagnosis not present

## 2021-11-24 DIAGNOSIS — I10 Essential (primary) hypertension: Secondary | ICD-10-CM | POA: Diagnosis not present

## 2021-11-24 DIAGNOSIS — Z823 Family history of stroke: Secondary | ICD-10-CM | POA: Diagnosis not present

## 2021-11-24 DIAGNOSIS — R000 Tachycardia, unspecified: Secondary | ICD-10-CM | POA: Diagnosis not present

## 2021-11-24 DIAGNOSIS — Z23 Encounter for immunization: Secondary | ICD-10-CM | POA: Diagnosis not present

## 2022-10-30 DIAGNOSIS — N1831 Chronic kidney disease, stage 3a: Secondary | ICD-10-CM | POA: Diagnosis not present

## 2022-10-30 DIAGNOSIS — I6300 Cerebral infarction due to thrombosis of unspecified precerebral artery: Secondary | ICD-10-CM | POA: Diagnosis not present

## 2022-10-30 DIAGNOSIS — Z79899 Other long term (current) drug therapy: Secondary | ICD-10-CM | POA: Diagnosis not present

## 2022-11-07 DIAGNOSIS — I10 Essential (primary) hypertension: Secondary | ICD-10-CM | POA: Diagnosis not present

## 2022-11-07 DIAGNOSIS — Z8673 Personal history of transient ischemic attack (TIA), and cerebral infarction without residual deficits: Secondary | ICD-10-CM | POA: Diagnosis not present

## 2022-11-07 DIAGNOSIS — N1831 Chronic kidney disease, stage 3a: Secondary | ICD-10-CM | POA: Diagnosis not present

## 2022-11-07 DIAGNOSIS — E785 Hyperlipidemia, unspecified: Secondary | ICD-10-CM | POA: Diagnosis not present

## 2023-05-11 DIAGNOSIS — N1832 Chronic kidney disease, stage 3b: Secondary | ICD-10-CM | POA: Diagnosis not present

## 2023-05-11 DIAGNOSIS — I6300 Cerebral infarction due to thrombosis of unspecified precerebral artery: Secondary | ICD-10-CM | POA: Diagnosis not present

## 2023-05-11 DIAGNOSIS — Z79899 Other long term (current) drug therapy: Secondary | ICD-10-CM | POA: Diagnosis not present

## 2023-05-11 DIAGNOSIS — I10 Essential (primary) hypertension: Secondary | ICD-10-CM | POA: Diagnosis not present

## 2023-05-31 DIAGNOSIS — N1832 Chronic kidney disease, stage 3b: Secondary | ICD-10-CM | POA: Diagnosis not present

## 2023-05-31 DIAGNOSIS — I10 Essential (primary) hypertension: Secondary | ICD-10-CM | POA: Diagnosis not present

## 2023-05-31 DIAGNOSIS — Z8673 Personal history of transient ischemic attack (TIA), and cerebral infarction without residual deficits: Secondary | ICD-10-CM | POA: Diagnosis not present

## 2023-06-09 ENCOUNTER — Emergency Department (HOSPITAL_COMMUNITY)

## 2023-06-09 ENCOUNTER — Other Ambulatory Visit: Payer: Self-pay

## 2023-06-09 ENCOUNTER — Inpatient Hospital Stay (HOSPITAL_COMMUNITY)
Admission: EM | Admit: 2023-06-09 | Discharge: 2023-06-12 | DRG: 871 | Disposition: A | Discharge status: Home Health | Attending: Family Medicine | Admitting: Family Medicine

## 2023-06-09 DIAGNOSIS — J449 Chronic obstructive pulmonary disease, unspecified: Secondary | ICD-10-CM | POA: Diagnosis present

## 2023-06-09 DIAGNOSIS — I739 Peripheral vascular disease, unspecified: Secondary | ICD-10-CM | POA: Diagnosis present

## 2023-06-09 DIAGNOSIS — Z7982 Long term (current) use of aspirin: Secondary | ICD-10-CM

## 2023-06-09 DIAGNOSIS — R6510 Systemic inflammatory response syndrome (SIRS) of non-infectious origin without acute organ dysfunction: Secondary | ICD-10-CM | POA: Diagnosis not present

## 2023-06-09 DIAGNOSIS — F17210 Nicotine dependence, cigarettes, uncomplicated: Secondary | ICD-10-CM | POA: Diagnosis present

## 2023-06-09 DIAGNOSIS — Z79899 Other long term (current) drug therapy: Secondary | ICD-10-CM

## 2023-06-09 DIAGNOSIS — H9190 Unspecified hearing loss, unspecified ear: Secondary | ICD-10-CM | POA: Diagnosis present

## 2023-06-09 DIAGNOSIS — A419 Sepsis, unspecified organism: Secondary | ICD-10-CM | POA: Diagnosis not present

## 2023-06-09 DIAGNOSIS — M4854XA Collapsed vertebra, not elsewhere classified, thoracic region, initial encounter for fracture: Secondary | ICD-10-CM | POA: Diagnosis not present

## 2023-06-09 DIAGNOSIS — A4151 Sepsis due to Escherichia coli [E. coli]: Secondary | ICD-10-CM | POA: Diagnosis not present

## 2023-06-09 DIAGNOSIS — Z1152 Encounter for screening for COVID-19: Secondary | ICD-10-CM | POA: Diagnosis not present

## 2023-06-09 DIAGNOSIS — R7401 Elevation of levels of liver transaminase levels: Secondary | ICD-10-CM | POA: Diagnosis not present

## 2023-06-09 DIAGNOSIS — N184 Chronic kidney disease, stage 4 (severe): Secondary | ICD-10-CM | POA: Diagnosis not present

## 2023-06-09 DIAGNOSIS — I69392 Facial weakness following cerebral infarction: Secondary | ICD-10-CM | POA: Diagnosis not present

## 2023-06-09 DIAGNOSIS — Z720 Tobacco use: Secondary | ICD-10-CM | POA: Diagnosis present

## 2023-06-09 DIAGNOSIS — I700 Atherosclerosis of aorta: Secondary | ICD-10-CM | POA: Diagnosis not present

## 2023-06-09 DIAGNOSIS — I69351 Hemiplegia and hemiparesis following cerebral infarction affecting right dominant side: Secondary | ICD-10-CM

## 2023-06-09 DIAGNOSIS — Z8673 Personal history of transient ischemic attack (TIA), and cerebral infarction without residual deficits: Secondary | ICD-10-CM

## 2023-06-09 DIAGNOSIS — R000 Tachycardia, unspecified: Secondary | ICD-10-CM | POA: Diagnosis not present

## 2023-06-09 DIAGNOSIS — I129 Hypertensive chronic kidney disease with stage 1 through stage 4 chronic kidney disease, or unspecified chronic kidney disease: Secondary | ICD-10-CM | POA: Diagnosis present

## 2023-06-09 DIAGNOSIS — R6521 Severe sepsis with septic shock: Secondary | ICD-10-CM | POA: Diagnosis present

## 2023-06-09 DIAGNOSIS — N3289 Other specified disorders of bladder: Secondary | ICD-10-CM | POA: Diagnosis not present

## 2023-06-09 DIAGNOSIS — E872 Acidosis, unspecified: Secondary | ICD-10-CM | POA: Insufficient documentation

## 2023-06-09 DIAGNOSIS — N1832 Chronic kidney disease, stage 3b: Secondary | ICD-10-CM | POA: Diagnosis present

## 2023-06-09 DIAGNOSIS — I10 Essential (primary) hypertension: Secondary | ICD-10-CM | POA: Diagnosis present

## 2023-06-09 DIAGNOSIS — K5900 Constipation, unspecified: Secondary | ICD-10-CM | POA: Diagnosis present

## 2023-06-09 DIAGNOSIS — N179 Acute kidney failure, unspecified: Secondary | ICD-10-CM | POA: Diagnosis present

## 2023-06-09 DIAGNOSIS — E782 Mixed hyperlipidemia: Secondary | ICD-10-CM | POA: Diagnosis present

## 2023-06-09 DIAGNOSIS — Z0389 Encounter for observation for other suspected diseases and conditions ruled out: Secondary | ICD-10-CM | POA: Diagnosis not present

## 2023-06-09 LAB — LACTIC ACID, PLASMA
Lactic Acid, Venous: 4.2 mmol/L (ref 0.5–1.9)
Lactic Acid, Venous: 4.1 mmol/L (ref 0.5–1.9)

## 2023-06-09 LAB — BLOOD GAS, VENOUS
Acid-base deficit: 4 mmol/L — ABNORMAL HIGH (ref 0.0–2.0)
Bicarbonate: 19.6 mmol/L — ABNORMAL LOW (ref 20.0–28.0)
O2 Saturation: 72.8 %
Patient temperature: 38.2
pCO2, Ven: 33 mmHg — ABNORMAL LOW (ref 44–60)
pH, Ven: 7.39 (ref 7.25–7.43)
pO2, Ven: 41 mmHg (ref 32–45)

## 2023-06-09 LAB — CBC WITH DIFFERENTIAL/PLATELET
Abs Immature Granulocytes: 0.08 10*3/uL — ABNORMAL HIGH (ref 0.00–0.07)
Basophils Absolute: 0.1 10*3/uL (ref 0.0–0.1)
Basophils Relative: 0 %
Eosinophils Absolute: 0.2 10*3/uL (ref 0.0–0.5)
Eosinophils Relative: 1 %
HCT: 49.5 % (ref 39.0–52.0)
Hemoglobin: 17 g/dL (ref 13.0–17.0)
Immature Granulocytes: 0 %
Lymphocytes Relative: 3 %
Lymphs Abs: 0.5 10*3/uL — ABNORMAL LOW (ref 0.7–4.0)
MCH: 34.6 pg — ABNORMAL HIGH (ref 26.0–34.0)
MCHC: 34.3 g/dL (ref 30.0–36.0)
MCV: 100.6 fL — ABNORMAL HIGH (ref 80.0–100.0)
Monocytes Absolute: 0.8 10*3/uL (ref 0.1–1.0)
Monocytes Relative: 5 %
Neutro Abs: 16.2 10*3/uL — ABNORMAL HIGH (ref 1.7–7.7)
Neutrophils Relative %: 91 %
Platelets: 242 10*3/uL (ref 150–400)
RBC: 4.92 MIL/uL (ref 4.22–5.81)
RDW: 13.2 % (ref 11.5–15.5)
WBC: 17.8 10*3/uL — ABNORMAL HIGH (ref 4.0–10.5)
nRBC: 0 % (ref 0.0–0.2)

## 2023-06-09 LAB — COMPREHENSIVE METABOLIC PANEL WITH GFR
ALT: 204 U/L — ABNORMAL HIGH (ref 0–44)
AST: 327 U/L — ABNORMAL HIGH (ref 15–41)
Albumin: 3.6 g/dL (ref 3.5–5.0)
Alkaline Phosphatase: 263 U/L — ABNORMAL HIGH (ref 38–126)
Anion gap: 13 (ref 5–15)
BUN: 28 mg/dL — ABNORMAL HIGH (ref 8–23)
CO2: 19 mmol/L — ABNORMAL LOW (ref 22–32)
Calcium: 9.1 mg/dL (ref 8.9–10.3)
Chloride: 104 mmol/L (ref 98–111)
Creatinine, Ser: 2.21 mg/dL — ABNORMAL HIGH (ref 0.61–1.24)
GFR, Estimated: 29 mL/min — ABNORMAL LOW (ref >60)
Glucose, Bld: 187 mg/dL — ABNORMAL HIGH (ref 70–99)
Potassium: 4.2 mmol/L (ref 3.5–5.1)
Sodium: 136 mmol/L (ref 135–145)
Total Bilirubin: 2.9 mg/dL — ABNORMAL HIGH (ref 0.0–1.2)
Total Protein: 7.5 g/dL (ref 6.5–8.1)

## 2023-06-09 LAB — PROTIME-INR
INR: 0.9 (ref 0.8–1.2)
Prothrombin Time: 12.5 s (ref 11.4–15.2)

## 2023-06-09 LAB — TSH: TSH: 3.144 u[IU]/mL (ref 0.350–4.500)

## 2023-06-09 LAB — ETHANOL: Alcohol, Ethyl (B): <15 mg/dL (ref <15)

## 2023-06-09 MED ORDER — METHYLPREDNISOLONE SODIUM SUCC 125 MG IJ SOLR
125.0000 mg | Freq: Once | INTRAMUSCULAR | Status: AC
  Administered 2023-06-09: 125 mg via INTRAVENOUS
  Filled 2023-06-09: qty 2

## 2023-06-09 MED ORDER — ALBUTEROL SULFATE (2.5 MG/3ML) 0.083% IN NEBU
10.0000 mg | INHALATION_SOLUTION | RESPIRATORY_TRACT | Status: AC
  Administered 2023-06-09: 10 mg via RESPIRATORY_TRACT
  Filled 2023-06-09: qty 12

## 2023-06-09 MED ORDER — SODIUM CHLORIDE 0.9 % IV SOLN
2.0000 g | Freq: Once | INTRAVENOUS | Status: AC
  Administered 2023-06-09: 2 g via INTRAVENOUS
  Filled 2023-06-09: qty 20

## 2023-06-09 MED ORDER — SODIUM CHLORIDE 0.9 % IV BOLUS
1000.0000 mL | Freq: Once | INTRAVENOUS | Status: AC
  Administered 2023-06-09: 1000 mL via INTRAVENOUS

## 2023-06-09 MED ORDER — SODIUM CHLORIDE 0.9 % IV SOLN
500.0000 mg | Freq: Once | INTRAVENOUS | Status: AC
  Administered 2023-06-09: 500 mg via INTRAVENOUS
  Filled 2023-06-09: qty 5

## 2023-06-09 MED ORDER — PIPERACILLIN-TAZOBACTAM 3.375 G IVPB 30 MIN
3.3750 g | Freq: Once | INTRAVENOUS | Status: AC
  Administered 2023-06-10: 3.375 g via INTRAVENOUS
  Filled 2023-06-09: qty 50

## 2023-06-09 MED ORDER — LACTATED RINGERS IV SOLN: INTRAVENOUS | Status: AC

## 2023-06-09 NOTE — ED Notes (Signed)
Dr Miller at bedside,  

## 2023-06-09 NOTE — Progress Notes (Signed)
 CODE SEPSIS - PHARMACY COMMUNICATION  **Broad Spectrum Antibiotics should be administered within 1 hour of Sepsis diagnosis**  Time Code Sepsis Called/Page Received: 2056  Antibiotics Ordered: azithromycin, ceftriaxone  Time of 1st antibiotic administration: 2151  Additional action taken by pharmacy: Messaged nurse  If necessary, Name of Provider/Nurse Contacted: Lessie Ratel, RN    Alice Innocent ,PharmD Clinical Pharmacist  06/09/2023  8:57 PM

## 2023-06-09 NOTE — Sepsis Progress Note (Signed)
 Elink following for sepsis protocol.

## 2023-06-09 NOTE — ED Provider Notes (Addendum)
 [name] AT [facility] Provider Note   CSN: [id number] Arrival date & time: 06/09/23  2046     History  Chief Complaint  Patient presents with   Weakness    Jerry Rodriguez is a 83 y.o. male.   Weakness  This patient is an 83 year old male with a history of COPD and hypertension, he is not oxygen dependent and never uses breathing treatments, he lives with his wife, he is accompanied to the hospital today by his son who brought him to the hospital because of severe weakness that essentially prevented him from even getting up and walking today.  He has no history of vomiting or diarrhea, there is a chronic mild cough, he smokes about 2 packs of cigarettes a day for many many years.  He recently had a follow-up with his doctor about 2 weeks ago for his regular checkup and everything went okay.  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  The patient is extremely hard of hearing.  There has been no obvious fevers vomiting or diarrhea.  The patient did have a stroke about 13 years ago in 2012 which left him with some subtle right-sided facial droop and mild unilateral weakness    Home Medications Prior to Admission medications   Medication Sig Start Date End Date Taking? Authorizing Provider  acetaminophen  (TYLENOL ) 325 MG tablet Take 650 mg by mouth every 6 (six) hours as needed.    [provider]  amLODipine  (NORVASC ) 5 MG tablet Take 5 mg by mouth daily.      [provider]  aspirin  81 MG tablet Take 81 mg by mouth daily.    [provider]  atorvastatin  (LIPITOR) 20 MG tablet Take 20 mg by mouth daily. 02/15/15   [provider]  cephALEXin (KEFLEX) 500 MG capsule Take 500 mg by mouth 4 (four) times daily. 03/09/21   [provider]  lactobacillus acidophilus & bulgar (LACTINEX) chewable tablet Chew 1 tablet by mouth 3 (three) times daily  with meals. 03/18/21   Vann, Jessica U, DO  Pneumococcal 13-Val Conj Vacc (PREVNAR 13 IM) Prevnar 13 (PF)    [provider]  ramipril  (ALTACE ) 5 MG capsule Take 1 capsule by mouth daily. 06/09/20   [provider]  vitamin B-12 (CYANOCOBALAMIN ) 1000 MCG tablet Take 1 tablet (1,000 mcg total) by mouth daily. 03/18/21   Vann, Jessica U, DO      Allergies    Patient has no known allergies.    Review of Systems   Review of Systems  Neurological:  Positive for weakness.  All other systems reviewed and are negative.   Physical Exam Updated Vital Signs BP 108/70   Pulse (!) 112   Resp (!) 29   Ht 1.854 m (6\' 1" )   Wt 81.6 kg   SpO2 95%   BMI 23.75 kg/m  Physical Exam Vitals and nursing note reviewed.  Constitutional:      General: He is in acute distress.     Appearance: He is well-developed. He is ill-appearing.  HENT:     Head: Normocephalic and atraumatic.     Mouth/Throat:     Pharynx: No oropharyngeal exudate.  Eyes:     General: No scleral icterus.       Right eye: No discharge.        Left eye: No discharge.  Conjunctiva/sclera: Conjunctivae normal.     Pupils: Pupils are equal, round, and reactive to light.  Neck:     Thyroid: No thyromegaly.     Vascular: No JVD.  Cardiovascular:     Rate and Rhythm: Regular rhythm. Tachycardia present.     Heart sounds: Normal heart sounds. No murmur heard.    No friction rub. No gallop.  Pulmonary:     Effort: Respiratory distress present.     Breath sounds: Wheezing, rhonchi and rales present.     Comments: Very abnormal breath sounds, breath of 35 to 40/min, wheezing on expiration, tachypneic, Abdominal:     General: Bowel sounds are normal. There is no distension.     Palpations: Abdomen is soft. There is no mass.     Tenderness: There is no abdominal tenderness.  Musculoskeletal:        General: No tenderness. Normal range of motion.     Cervical back: Normal range of motion and neck supple.      Right lower leg: No edema.     Left lower leg: No edema.  Lymphadenopathy:     Cervical: No cervical adenopathy.  Skin:    General: Skin is warm and dry.     Findings: No erythema or rash.  Neurological:     Mental Status: He is alert.     Coordination: Coordination normal.     Comments: Generally weak but able to follow commands, needs 1 person assisting with sitting up in the bed  Psychiatric:        Behavior: Behavior normal.     ED Results / Procedures / Treatments   Labs (all labs ordered are listed, but only abnormal results are displayed) Labs Reviewed  LACTIC ACID, PLASMA - Abnormal; Notable for the following components:      Result Value   Lactic Acid, Venous 4.2 (*)    All other components within normal limits  COMPREHENSIVE METABOLIC PANEL WITH GFR - Abnormal; Notable for the following components:   CO2 19 (*)    Glucose, Bld 187 (*)    BUN 28 (*)    Creatinine, Ser 2.21 (*)    AST 327 (*)    ALT 204 (*)    Alkaline Phosphatase 263 (*)    Total Bilirubin 2.9 (*)    GFR, Estimated 29 (*)    All other components within normal limits  CBC WITH DIFFERENTIAL/PLATELET - Abnormal; Notable for the following components:   WBC 17.8 (*)    MCV 100.6 (*)    MCH 34.6 (*)    Neutro Abs 16.2 (*)    Lymphs Abs 0.5 (*)    Abs Immature Granulocytes 0.08 (*)    All other components within normal limits  BLOOD GAS, VENOUS - Abnormal; Notable for the following components:   pCO2, Ven 33 (*)    Bicarbonate 19.6 (*)    Acid-base deficit 4.0 (*)    All other components within normal limits  CULTURE, BLOOD (ROUTINE X 2)  CULTURE, BLOOD (ROUTINE X 2)  RESP PANEL BY RT-PCR (RSV, FLU A&B, COVID)  RVPGX2  PROTIME-INR  ETHANOL  TSH  LACTIC ACID, PLASMA  URINALYSIS, W/ REFLEX TO CULTURE (INFECTION SUSPECTED)    EKG None  Radiology DG Chest Port 1 View Result Date: 06/09/2023 CLINICAL DATA:  Possible sepsis EXAM: PORTABLE CHEST 1 VIEW COMPARISON:  03/17/2021 FINDINGS:  Cardiac shadow is stable. Aortic calcifications are seen. Lungs are well aerated bilaterally. No focal infiltrate or effusion is noted. No  bony abnormality is seen. IMPRESSION: No active disease. Electronically Signed   By: Violeta Grey M.D.   On: 06/09/2023 21:22    Procedures .Critical Care  Performed by: Early Glisson, MD Authorized by: Early Glisson, MD   Critical care provider statement:    Critical care time (minutes):  45   Critical care time was exclusive of:  Separately billable procedures and treating other patients and teaching time   Critical care was necessary to treat or prevent imminent or life-threatening deterioration of the following conditions:  Sepsis, shock and renal failure   Critical care was time spent personally by me on the following activities:  Development of treatment plan with patient or surrogate, discussions with consultants, evaluation of patient's response to treatment, examination of patient, obtaining history from patient or surrogate, review of old charts, re-evaluation of patient's condition, pulse oximetry, ordering and review of radiographic studies, ordering and review of laboratory studies and ordering and performing treatments and interventions   I assumed direction of critical care for this patient from another provider in my specialty: no     Care discussed with: admitting provider   Comments:           Medications Ordered in ED Medications  lactated ringers  infusion (has no administration in time range)  albuterol  (PROVENTIL ) (2.5 MG/3ML) 0.083% nebulizer solution 10 mg (10 mg Nebulization New Bag/Given 06/09/23 2125)  piperacillin -tazobactam (ZOSYN ) IVPB 3.375 g (has no administration in time range)  cefTRIAXone  (ROCEPHIN ) 2 g in sodium chloride  0.9 % 100 mL IVPB (2 g Intravenous New Bag/Given 06/09/23 2151)  azithromycin  (ZITHROMAX ) 500 mg in sodium chloride  0.9 % 250 mL IVPB (500 mg Intravenous New Bag/Given 06/09/23 2151)  methylPREDNISolone   sodium succinate (SOLU-MEDROL ) 125 mg/2 mL injection 125 mg (125 mg Intravenous Given 06/09/23 2148)  sodium chloride  0.9 % bolus 1,000 mL (1,000 mLs Intravenous New Bag/Given 06/09/23 2123)  sodium chloride  0.9 % bolus 1,000 mL (1,000 mLs Intravenous New Bag/Given 06/09/23 2123)    ED Course/ Medical Decision Making/ A&P                                 Medical Decision Making Amount and/or Complexity of Data Reviewed Labs: ordered. Radiology: ordered.  Risk Prescription drug management. Decision regarding hospitalization.    This patient presents to the ED for concern of generalized weakness, this involves an extensive number of treatment options, and is a complaint that carries with it a high risk of complications and morbidity.  The differential diagnosis includes sepsis, pneumonia, hypercapnia, electrolyte disturbance, dehydration, stroke   Co morbidities that complicate the patient evaluation  Prior stroke, hypertension, severe significant tobacco use   Additional history obtained:  Additional history obtained from record External records from outside source obtained and reviewed including office visits, admitted to the hospital for weakness in 2023, noted to have diarrhea at that time.  He has chronic kidney disease   Lab Tests:  I Ordered, and personally interpreted labs.  The pertinent results include: Acute kidney injury, lactic acidosis of 4.2, CO2 of 19, no increased anion gap, creatinine of 2.2 with a baseline of 1.3, LFTs are significantly elevated in the 300s with an alkaline phosphatase of 263 and a bilirubin of close to 3, white blood cell count of 17,800 with leftward shift, INR is normal, TSH is normal   Imaging Studies ordered:  I ordered imaging studies including chest x-ray unremarkable I independently visualized  and interpreted imaging which is pending at the time of change of shift. I agree with the radiologist interpretation   Cardiac Monitoring: /  EKG:  The patient was maintained on a cardiac monitor.  I personally viewed and interpreted the cardiac monitored which showed an underlying rhythm of: Tachycardia   Problem List / ED Course / Critical interventions / Medication management  This patient is critically ill with tachycardia leukocytosis renal failure hypotension but is responding to IV fluids.  He has been given antibiotics, testing suggests renal failure with some liver disease as well, concern for intra-abdominal source as there is no signs of pneumonia.  The patient does appear critically ill and will likely need ICU tonight, lactate is over 4 consistent with a septic shock scenario I ordered medication including biotics and IV fluids at 30 cc/kg or more for hypotension and septic shock Reevaluation of the patient after these medicines showed that the patient rightly improved I have reviewed the patients home medicines and have made adjustments as needed   Consultations Obtained:  At the time of change of shift care signed out to oncoming emergency department physician to follow-up CT scan and admit  Social Determinants of Health:  Chronically ill   Test / Admission - Considered:  Will need to be admitted to higher level of care     Patient was reassessed at 11:00 PM, a total of 2 L of IV fluids were ordered due to the patient's ideal bodyweight.    Final Clinical Impression(s) / ED Diagnoses Final diagnoses:  Septic shock (HCC)  Acute renal failure, unspecified acute renal failure type [facility])    Rx / DC Orders ED Discharge Orders     None         Early Glisson, MD 06/09/23 2316    Early Glisson, MD 07/02/23 1250

## 2023-06-09 NOTE — ED Triage Notes (Signed)
 Pt brought to er by son with c/o generalized weakness that became worse today, pt is very hard of hearing, difficult to obtain history on pt,.

## 2023-06-10 DIAGNOSIS — N179 Acute kidney failure, unspecified: Secondary | ICD-10-CM | POA: Diagnosis present

## 2023-06-10 DIAGNOSIS — M4854XA Collapsed vertebra, not elsewhere classified, thoracic region, initial encounter for fracture: Secondary | ICD-10-CM | POA: Diagnosis present

## 2023-06-10 DIAGNOSIS — I10 Essential (primary) hypertension: Secondary | ICD-10-CM

## 2023-06-10 DIAGNOSIS — N1832 Chronic kidney disease, stage 3b: Secondary | ICD-10-CM | POA: Diagnosis present

## 2023-06-10 DIAGNOSIS — Z8673 Personal history of transient ischemic attack (TIA), and cerebral infarction without residual deficits: Secondary | ICD-10-CM

## 2023-06-10 DIAGNOSIS — N184 Chronic kidney disease, stage 4 (severe): Secondary | ICD-10-CM

## 2023-06-10 DIAGNOSIS — Z720 Tobacco use: Secondary | ICD-10-CM

## 2023-06-10 DIAGNOSIS — R6521 Severe sepsis with septic shock: Secondary | ICD-10-CM | POA: Diagnosis present

## 2023-06-10 DIAGNOSIS — Z1152 Encounter for screening for COVID-19: Secondary | ICD-10-CM | POA: Diagnosis not present

## 2023-06-10 DIAGNOSIS — I69392 Facial weakness following cerebral infarction: Secondary | ICD-10-CM | POA: Diagnosis not present

## 2023-06-10 DIAGNOSIS — K5900 Constipation, unspecified: Secondary | ICD-10-CM | POA: Diagnosis present

## 2023-06-10 DIAGNOSIS — R7401 Elevation of levels of liver transaminase levels: Secondary | ICD-10-CM | POA: Diagnosis not present

## 2023-06-10 DIAGNOSIS — I69351 Hemiplegia and hemiparesis following cerebral infarction affecting right dominant side: Secondary | ICD-10-CM | POA: Diagnosis not present

## 2023-06-10 DIAGNOSIS — R6510 Systemic inflammatory response syndrome (SIRS) of non-infectious origin without acute organ dysfunction: Secondary | ICD-10-CM

## 2023-06-10 DIAGNOSIS — J449 Chronic obstructive pulmonary disease, unspecified: Secondary | ICD-10-CM | POA: Diagnosis present

## 2023-06-10 DIAGNOSIS — F17210 Nicotine dependence, cigarettes, uncomplicated: Secondary | ICD-10-CM | POA: Diagnosis present

## 2023-06-10 DIAGNOSIS — I129 Hypertensive chronic kidney disease with stage 1 through stage 4 chronic kidney disease, or unspecified chronic kidney disease: Secondary | ICD-10-CM | POA: Diagnosis present

## 2023-06-10 DIAGNOSIS — E872 Acidosis, unspecified: Secondary | ICD-10-CM | POA: Diagnosis present

## 2023-06-10 DIAGNOSIS — A4151 Sepsis due to Escherichia coli [E. coli]: Secondary | ICD-10-CM | POA: Diagnosis present

## 2023-06-10 DIAGNOSIS — H9190 Unspecified hearing loss, unspecified ear: Secondary | ICD-10-CM | POA: Diagnosis present

## 2023-06-10 DIAGNOSIS — I739 Peripheral vascular disease, unspecified: Secondary | ICD-10-CM | POA: Diagnosis present

## 2023-06-10 DIAGNOSIS — E782 Mixed hyperlipidemia: Secondary | ICD-10-CM | POA: Diagnosis present

## 2023-06-10 DIAGNOSIS — Z79899 Other long term (current) drug therapy: Secondary | ICD-10-CM | POA: Diagnosis not present

## 2023-06-10 DIAGNOSIS — Z7982 Long term (current) use of aspirin: Secondary | ICD-10-CM | POA: Diagnosis not present

## 2023-06-10 LAB — COMPREHENSIVE METABOLIC PANEL WITH GFR
ALT: 148 U/L — ABNORMAL HIGH (ref 0–44)
AST: 143 U/L — ABNORMAL HIGH (ref 15–41)
Albumin: 2.5 g/dL — ABNORMAL LOW (ref 3.5–5.0)
Alkaline Phosphatase: 166 U/L — ABNORMAL HIGH (ref 38–126)
Anion gap: 7 (ref 5–15)
BUN: 27 mg/dL — ABNORMAL HIGH (ref 8–23)
CO2: 18 mmol/L — ABNORMAL LOW (ref 22–32)
Calcium: 8.1 mg/dL — ABNORMAL LOW (ref 8.9–10.3)
Chloride: 110 mmol/L (ref 98–111)
Creatinine, Ser: 1.99 mg/dL — ABNORMAL HIGH (ref 0.61–1.24)
GFR, Estimated: 33 mL/min — ABNORMAL LOW (ref >60)
Glucose, Bld: 227 mg/dL — ABNORMAL HIGH (ref 70–99)
Potassium: 3.6 mmol/L (ref 3.5–5.1)
Sodium: 135 mmol/L (ref 135–145)
Total Bilirubin: 1 mg/dL (ref 0.0–1.2)
Total Protein: 5.6 g/dL — ABNORMAL LOW (ref 6.5–8.1)

## 2023-06-10 LAB — BLOOD CULTURE ID PANEL (REFLEXED) - BCID2

## 2023-06-10 LAB — RESP PANEL BY RT-PCR (RSV, FLU A&B, COVID)  RVPGX2
Influenza A by PCR: NEGATIVE
Influenza B by PCR: NEGATIVE
Resp Syncytial Virus by PCR: NEGATIVE
SARS Coronavirus 2 by RT PCR: NEGATIVE

## 2023-06-10 LAB — CBC
HCT: 40.2 % (ref 39.0–52.0)
Hemoglobin: 13.3 g/dL (ref 13.0–17.0)
MCH: 33.4 pg (ref 26.0–34.0)
MCHC: 33.1 g/dL (ref 30.0–36.0)
MCV: 101 fL — ABNORMAL HIGH (ref 80.0–100.0)
Platelets: 150 10*3/uL (ref 150–400)
RBC: 3.98 MIL/uL — ABNORMAL LOW (ref 4.22–5.81)
RDW: 13.1 % (ref 11.5–15.5)
WBC: 11 10*3/uL — ABNORMAL HIGH (ref 4.0–10.5)
nRBC: 0 % (ref 0.0–0.2)

## 2023-06-10 LAB — LACTIC ACID, PLASMA
Lactic Acid, Venous: 4.2 mmol/L (ref 0.5–1.9)
Lactic Acid, Venous: 3.7 mmol/L (ref 0.5–1.9)
Lactic Acid, Venous: 3.5 mmol/L (ref 0.5–1.9)
Lactic Acid, Venous: 2.8 mmol/L (ref 0.5–1.9)

## 2023-06-10 LAB — MAGNESIUM: Magnesium: 1.8 mg/dL (ref 1.7–2.4)

## 2023-06-10 LAB — HEPATITIS PANEL, ACUTE
HCV Ab: NONREACTIVE
Hep A IgM: NONREACTIVE
Hep B C IgM: NONREACTIVE
Hepatitis B Surface Ag: NONREACTIVE

## 2023-06-10 LAB — PHOSPHORUS: Phosphorus: 1.4 mg/dL — ABNORMAL LOW (ref 2.5–4.6)

## 2023-06-10 LAB — MRSA NEXT GEN BY PCR, NASAL: MRSA by PCR Next Gen: NOT DETECTED

## 2023-06-10 MED ORDER — ENOXAPARIN SODIUM 30 MG/0.3ML IJ SOSY
30.0000 mg | PREFILLED_SYRINGE | INTRAMUSCULAR | Status: DC
  Administered 2023-06-10: 30 mg via SUBCUTANEOUS
  Filled 2023-06-10: qty 0.3

## 2023-06-10 MED ORDER — ONDANSETRON HCL 4 MG PO TABS: 4.0000 mg | ORAL_TABLET | Freq: Four times a day (QID) | ORAL | Status: DC | PRN

## 2023-06-10 MED ORDER — ACETAMINOPHEN 650 MG RE SUPP: 650.0000 mg | Freq: Four times a day (QID) | RECTAL | Status: DC | PRN

## 2023-06-10 MED ORDER — ORAL CARE MOUTH RINSE: 15.0000 mL | OROMUCOSAL | Status: DC | PRN

## 2023-06-10 MED ORDER — ONDANSETRON HCL 4 MG/2ML IJ SOLN: 4.0000 mg | Freq: Four times a day (QID) | INTRAMUSCULAR | Status: DC | PRN

## 2023-06-10 MED ORDER — RAMIPRIL 5 MG PO CAPS
5.0000 mg | ORAL_CAPSULE | Freq: Every day | ORAL | Status: DC
  Administered 2023-06-10: 5 mg via ORAL
  Filled 2023-06-10 (×2): qty 1

## 2023-06-10 MED ORDER — SODIUM CHLORIDE 0.9 % IV SOLN
500.0000 mg | INTRAVENOUS | Status: DC
  Administered 2023-06-10: 500 mg via INTRAVENOUS
  Filled 2023-06-10: qty 5

## 2023-06-10 MED ORDER — POTASSIUM PHOSPHATES 15 MMOLE/5ML IV SOLN
30.0000 mmol | Freq: Once | INTRAVENOUS | Status: AC
  Administered 2023-06-10: 30 mmol via INTRAVENOUS
  Filled 2023-06-10: qty 10

## 2023-06-10 MED ORDER — PIPERACILLIN-TAZOBACTAM 3.375 G IVPB
3.3750 g | Freq: Three times a day (TID) | INTRAVENOUS | Status: DC
  Administered 2023-06-10 – 2023-06-11 (×3): 3.375 g via INTRAVENOUS
  Filled 2023-06-10 (×3): qty 50

## 2023-06-10 MED ORDER — ATORVASTATIN CALCIUM 20 MG PO TABS
20.0000 mg | ORAL_TABLET | Freq: Every day | ORAL | Status: DC
  Administered 2023-06-10: 20 mg via ORAL
  Filled 2023-06-10: qty 1

## 2023-06-10 MED ORDER — CHLORHEXIDINE GLUCONATE CLOTH 2 % EX PADS
6.0000 | MEDICATED_PAD | Freq: Every day | CUTANEOUS | Status: DC
  Administered 2023-06-10 – 2023-06-12 (×2): 6 via TOPICAL

## 2023-06-10 MED ORDER — AMLODIPINE BESYLATE 5 MG PO TABS
5.0000 mg | ORAL_TABLET | Freq: Every day | ORAL | Status: DC
  Administered 2023-06-10 – 2023-06-12 (×3): 5 mg via ORAL
  Filled 2023-06-10 (×3): qty 1

## 2023-06-10 MED ORDER — ACETAMINOPHEN 325 MG PO TABS: 650.0000 mg | ORAL_TABLET | Freq: Four times a day (QID) | ORAL | Status: DC | PRN

## 2023-06-10 MED ORDER — NICOTINE 21 MG/24HR TD PT24
21.0000 mg | MEDICATED_PATCH | Freq: Every day | TRANSDERMAL | Status: DC
  Administered 2023-06-10 – 2023-06-12 (×3): 21 mg via TRANSDERMAL
  Filled 2023-06-10 (×3): qty 1

## 2023-06-10 NOTE — Progress Notes (Signed)
 Patient seen and evaluated, chart reviewed, please see EMR for updated orders. Please see full H&P dictated by admitting physician Dr. Adefeso for same date of service.   Brief Summary 83 y.o. male with medical history significant of essential hypertension, left carotid stenosis, hyperlipidemia, hearing loss, prior stroke without residual weakness, tobacco abuse, cervical spine fracture at age 60 admitted on 06/10/2023 with SIRS, AKI on CKD stage IV, transaminitis and generalized weakness with ambulatory dysfunction    Physical Exam Gen:- Awake Alert, in no acute distress  HEENT:- [place].AT, No sclera icterus Ears--significant hearing loss Neck-Supple Neck,No JVD,.  Lungs-  CTAB , fair air movement bilaterally  CV- S1, S2 normal, RRR Abd-  +ve B.Sounds, Abd Soft, No tenderness, no CVA area tenderness Extremity/Skin:- No  edema,   good pedal pulses  Psych-affect is appropriate, oriented x3, episodes of occasional confusion and cognitive challenges Neuro-generalized weakness, no new focal deficits, no tremors   A/p 1)AKI----acute kidney injury on CKD stage - IV -- creatinine on admission= 2.21,  baseline creatinine = 1.4    ,  creatinine is now= trending down with hydration currently 1.99,  -Continue IV fluids -Stop ramipril  --renally adjust medications, avoid nephrotoxic agents / dehydration  / hypotension  2)Transaminitis - Stop atorvastatin  - LFTs started to trend down - CT abdomen and pelvis without acute hepatobiliary findings - Acute viral hepatitis profile pending - Consider right upper quadrant ultrasound if LFTs fail to improve as anticipated    Latest Ref Rng & Units 06/10/2023    5:39 AM 06/09/2023    9:00 PM 03/18/2021    7:29 AM  Hepatic Function  Total Protein 6.5 - 8.1 g/dL 5.6  7.5  5.9   Albumin 3.5 - 5.0 g/dL 2.5  3.6  3.0   AST 15 - 41 U/L 143  327  14   ALT 0 - 44 U/L 148  204  13   Alk Phosphatase 38 - 126 U/L 166  263  113   Total Bilirubin 0.0 - 1.2 mg/dL 1.0   2.9  0.9    3) abnormal lung finding on imaging ----new 7 mm right lower lobe nodule. Non-contrast chest CT at 6-12 months is recommended. If the nodule is stable at time of repeat CT, then future CT at 18-24 months (from today's scan) is considered optional for low-risk patients, but is recommended for high-risk patients  4) tobacco abuse--- smoking cessation advised - Nicotine  patch given  5)HTN--continue amlodipine , hold ramipril  due to #1 above  6) history of CVA--continue aspirin  for secondary prevention, hold atorvastatin  due to #2 above  7)SIRs--POA - Presented with leukocytosis, tachycardia and tachypnea ,however, no obvious source of infection at this time. -No fevers -COVID, flu and RSV negative -CT abdomen and pelvis and chest x-ray without acute infectious findings -Patient did receive IV ceftriaxone, azithromycin, Zosyn.  - Okay to continue IV Zosyn and  azithromycin pending further culture data -Rocephin discontinued  8)Generalized Weakness/ambulatory dysfunction--CT abdomen and pelvis shows Chronic appearing T12 compression fracture new from the prior exam  -- PT eval requested  Patient seen and evaluated, chart reviewed, please see EMR for updated orders. Please see full H&P dictated by admitting physician Dr. Adefeso for same date of service.   -- Total care time 43 minutes  Colin Dawley, MD

## 2023-06-10 NOTE — H&P (Signed)
 History and Physical    Patient: Jerry Rodriguez:[id number] DOB: [birth year] DOA: 06/09/2023 DOS: the patient was seen and examined on 06/10/2023 PCP: Artemisa Bile, MD  Patient coming from: Home  Chief Complaint:  Chief Complaint  Patient presents with   Weakness   HPI: Jerry Rodriguez is a 83 y.o. male with medical history significant of essential hypertension, left carotid stenosis, hyperlipidemia, hearing loss, prior stroke without residual weakness, tobacco abuse, cervical spine fracture at age 58 who presents to the emergency department due to severe weakness with difficulty in being able to get up from sitting position and difficulty in being able to walk since yesterday.  Patient was able to help 2 feet decals with his son in the field a couple days ago.  Regular checkup with his PCP about 2 weeks ago went well.  There was no history of fall.  Patient was hard of hearing. Patient presents with residual mild unilateral weakness and minimal right-sided facial droop due to prior stroke in 2012.  ED Course:  In the emergency department, patient was tachypneic, tachycardic and BP was soft at 89/59.  Workup in the ED showed normal CBC except for leukocytosis with a WBC of 17.8.  Lactic acid 4.1 > 4.2 > 3.7 BMP was positive for bicarb of 15, blood glucose 2.7, BUN/creatinine 27/1.99, albumin 2.5, AST 143, ALT 148, ALP 166, GFR 33, CKD 3B, phosphorus 1.4.  Blood culture was pending.  Influenza A, B, SARS coronavirus.,  RSV was negative. CT abdomen and pelvis without contrast showed chronic appearing T12 compression fracture. New 7 mm right lower lobe nodule. Patient was empirically treated with IV ceftriaxone and azithromycin due to presumed CAP.  IV Solu-Medrol 125 mg x 1 was given, IV Zosyn was given, IV hydration per sepsis protocol was provided.  TRH was asked to admit patient.   Review of Systems: Review of systems as noted in the HPI. All other systems reviewed and are  negative.   Past Medical History:  Diagnosis Date   Carotid artery occlusion    COPD (chronic obstructive pulmonary disease) (HCC)    Hypertension    Peripheral vascular disease (HCC)    Stroke (HCC)    No past surgical history on file.  Social History:  reports that he has been smoking cigarettes. He has a 60 pack-year smoking history. He has never used smokeless tobacco. He reports that he does not drink alcohol and does not use drugs.   No Known Allergies  No family history on file.   Prior to Admission medications   Medication Sig Start Date End Date Taking? Authorizing Provider  acetaminophen (TYLENOL) 325 MG tablet Take 650 mg by mouth every 6 (six) hours as needed.    [provider]  amLODipine  (NORVASC ) 5 MG tablet Take 5 mg by mouth daily.      [provider]  aspirin  81 MG tablet Take 81 mg by mouth daily.    [provider]  atorvastatin  (LIPITOR) 20 MG tablet Take 20 mg by mouth daily. 02/15/15   [provider]  cephALEXin (KEFLEX) 500 MG capsule Take 500 mg by mouth 4 (four) times daily. 03/09/21   [provider]  lactobacillus acidophilus & bulgar (LACTINEX) chewable tablet Chew 1 tablet by mouth 3 (three) times daily with meals. 03/18/21   Vann, Jessica U, DO  Pneumococcal 13-Val Conj Vacc (PREVNAR 13 IM) Prevnar 13 (PF)    [provider]  ramipril  (ALTACE ) 5 MG capsule Take 1  capsule by mouth daily. 06/09/20   [provider]  vitamin B-12 (CYANOCOBALAMIN ) 1000 MCG tablet Take 1 tablet (1,000 mcg total) by mouth daily. 03/18/21   Vann, Jessica U, DO    Physical Exam: BP (!) 125/55 (BP Location: Right Arm)   Pulse 71   Temp (!) 97.5 F (36.4 C) (Axillary)   Resp (!) 21   Ht 6\' 1"  (1.854 m)   Wt 80 kg   SpO2 98%   BMI 23.27 kg/m   General: 83 y.o. year-old male ill appearing, but in no acute distress.   HEENT: NCAT, EOMI, HOH Neck: Supple, trachea medial Cardiovascular: Tachycardia.  Regular  rate and rhythm with no rubs or gallops.  No thyromegaly or JVD noted.  No lower extremity edema. 2/4 pulses in all 4 extremities. Respiratory: Tachypnea.  Coarse breath sounds.   Abdomen: Soft, nontender nondistended with normal bowel sounds x4 quadrants. Muskuloskeletal: No cyanosis, clubbing or edema noted bilaterally Neuro: Somnolent, but easily arousable, though quickly goes back to sleep.  Unable to cooperate with neurological exam at this time.  No focal neurologic deficit.  Skin: No ulcerative lesions noted or rashes Psychiatry: This cannot be assessed at this time due to patient being somnolent and unable to cooperate at this time.         Labs on Admission:  Basic Metabolic Panel: Recent Labs  Lab 06/09/23 2100 06/10/23 0539  NA 136 135  K 4.2 3.6  CL 104 110  CO2 19* 18*  GLUCOSE 187* 227*  BUN 28* 27*  CREATININE 2.21* 1.99*  CALCIUM  9.1 8.1*  MG  --  1.8  PHOS  --  1.4*   Liver Function Tests: Recent Labs  Lab 06/09/23 2100 06/10/23 0539  AST 327* 143*  ALT 204* 148*  ALKPHOS 263* 166*  BILITOT 2.9* 1.0  PROT 7.5 5.6*  ALBUMIN 3.6 2.5*   No results for input(s): "LIPASE", "AMYLASE" in the last 168 hours. No results for input(s): "AMMONIA" in the last 168 hours. CBC: Recent Labs  Lab 06/09/23 2100 06/10/23 0539  WBC 17.8* 11.0*  NEUTROABS 16.2*  --   HGB 17.0 13.3  HCT 49.5 40.2  MCV 100.6* 101.0*  PLT 242 150   Cardiac Enzymes: No results for input(s): "CKTOTAL", "CKMB", "CKMBINDEX", "TROPONINI" in the last 168 hours.  BNP (last 3 results) No results for input(s): "BNP" in the last 8760 hours.  ProBNP (last 3 results) No results for input(s): "PROBNP" in the last 8760 hours.  CBG: No results for input(s): "GLUCAP" in the last 168 hours.  Radiological Exams on Admission: CT ABDOMEN PELVIS WO CONTRAST Result Date: 06/09/2023 CLINICAL DATA:  Sepsis and weakness, initial encounter EXAM: CT ABDOMEN AND PELVIS WITHOUT CONTRAST TECHNIQUE:  Multidetector CT imaging of the abdomen and pelvis was performed following the standard protocol without IV contrast. RADIATION DOSE REDUCTION: This exam was performed according to the departmental dose-optimization program which includes automated exposure control, adjustment of the mA and/or kV according to patient size and/or use of iterative reconstruction technique. COMPARISON:  03/17/2021 FINDINGS: Lower chest: 7 mm nodular density in the right lower lobe is noted. This is new from the prior exam. Hepatobiliary: No focal liver abnormality is seen. No gallstones, gallbladder wall thickening, or biliary dilatation. Pancreas: Unremarkable. No pancreatic ductal dilatation or surrounding inflammatory changes. Spleen: Normal in size without focal abnormality. Adrenals/Urinary Tract: Adrenal glands are within normal limits. Kidneys demonstrate renal cystic change bilaterally stable from the prior exam. No follow-up is recommended. No  obstructive changes are seen. The bladder is partially distended. Stomach/Bowel: The appendix is within normal limits. No obstructive or inflammatory changes of the colon are seen. Stomach and small bowel are within normal limits. Vascular/Lymphatic: Atherosclerotic calcifications of the abdominal aorta are noted similar to that seen on the prior exam. Some findings suspicious for chronic dissection are noted. No lymphadenopathy is noted. Reproductive: Prostate is unremarkable. Other: No abdominal wall hernia or abnormality. No abdominopelvic ascites. Musculoskeletal: T12 compression fracture is noted new from the prior exam but of uncertain chronicity. The overall appearance suggests a chronic nature. IMPRESSION: Chronic appearing T12 compression fracture new from the prior exam. New 7 mm right lower lobe nodule. Non-contrast chest CT at 6-12 months is recommended. If the nodule is stable at time of repeat CT, then future CT at 18-24 months (from today's scan) is considered optional for  low-risk patients, but is recommended for high-risk patients. This recommendation follows the consensus statement: Guidelines for Management of Incidental Pulmonary Nodules Detected on CT Images: From the Fleischner Society 2017; Radiology 2017; 284:228-243. Electronically Signed   By: Violeta Grey M.D.   On: 06/09/2023 23:14   DG Chest Port 1 View Result Date: 06/09/2023 CLINICAL DATA:  Possible sepsis EXAM: PORTABLE CHEST 1 VIEW COMPARISON:  03/17/2021 FINDINGS: Cardiac shadow is stable. Aortic calcifications are seen. Lungs are well aerated bilaterally. No focal infiltrate or effusion is noted. No bony abnormality is seen. IMPRESSION: No active disease. Electronically Signed   By: Violeta Grey M.D.   On: 06/09/2023 21:22    EKG: I independently viewed the EKG done and my findings are as followed: Normal sinus rhythm at a rate of 93 bpm  Assessment/Plan Present on Admission:  SIRS (systemic inflammatory response syndrome) (HCC)  Acute kidney injury superimposed on stage 4 chronic kidney disease (HCC)  Tobacco abuse  Mixed hyperlipidemia  Essential hypertension  Principal Problem:   SIRS (systemic inflammatory response syndrome) (HCC) Active Problems:   Acute kidney injury superimposed on stage 4 chronic kidney disease (HCC)   Essential hypertension   Mixed hyperlipidemia   Tobacco abuse   Lactic acidosis   Transaminitis   Hypophosphatemia   History of CVA (cerebrovascular accident)  SIRS Patient was tachycardic, tachypneic, presents with leukocytosis.  However, no obvious source of infection at this time. He was empirically treated with IV ceftriaxone, azithromycin, Zosyn.  We shall continue with IV Zosyn and remote azithromycin at this time. Continue Tylenol as needed Blood culture pending  Lactic acidosis Lactic acidosis 4.1 > 4.2 > 3.7 Continue IV hydration and continue to trend lactic acid  Acute kidney injury on CKD 4 BUN/creatinine 27/1.99, 5 most recent level (was 3  years ago with creatinine of 1.33) Continue IV hydration Renally adjust medications, avoid nephrotoxic agents/dehydration/hypotension  Transaminitis ALT 204, ALP 263, AST 327 total bilirubin 2.9 This may be due to shock liver Hepatitis panel will be checked Consider RUQ ultrasound for worsening liver enzymes  Hypophosphatemia Phosphorus 1.4, this was replenished  Tobacco abuse Patient will be counseled on tobacco abuse cessation when more awake Continue nicotine  patch   History of CVA (cerebrovascular accident) Continue aspirin  and Lipitor Continue fall precaution  Continue PT/OT eval and treat   Mixed hyperlipidemia Continue Lipitor   Essential hypertension Continue amlodipine  and ramipril   DVT prophylaxis: Lovenox  Family Communication: None at bedside  Advance Care Planning:   Code Status: Full Code   Consults: None  Severity of Illness: The appropriate patient status for this patient is INPATIENT. Inpatient  status is judged to be reasonable and necessary in order to provide the required intensity of service to ensure the patient's safety. The patient's presenting symptoms, physical exam findings, and initial radiographic and laboratory data in the context of their chronic comorbidities is felt to place them at high risk for further clinical deterioration. Furthermore, it is not anticipated that the patient will be medically stable for discharge from the hospital within 2 midnights of admission.   * I certify that at the point of admission it is my clinical judgment that the patient will require inpatient hospital care spanning beyond 2 midnights from the point of admission due to high intensity of service, high risk for further deterioration and high frequency of surveillance required.*  Author: Kateryna Grantham, DO 06/10/2023 8:05 AM  For on call review [email or web address].

## 2023-06-10 NOTE — Progress Notes (Signed)
   06/10/23 1730  TOC Brief Assessment  Insurance and Status Reviewed  Patient has primary care physician Yes  Home environment has been reviewed Single Family Home  Prior level of function: Independent  Prior/Current Home Services No current home services  Social Drivers of Health Review SDOH reviewed no interventions necessary  Readmission risk has been reviewed Yes  Transition of care needs no transition of care needs at this time   Admitted for SIRS (systemic inflammatory response syndrome) . Transition of Care Department [facility]) has reviewed patient.  TOC will continue to monitor patient advancement through interdisciplinary progressions rounds.

## 2023-06-11 DIAGNOSIS — R6510 Systemic inflammatory response syndrome (SIRS) of non-infectious origin without acute organ dysfunction: Secondary | ICD-10-CM | POA: Diagnosis not present

## 2023-06-11 LAB — COMPREHENSIVE METABOLIC PANEL WITH GFR
ALT: 95 U/L — ABNORMAL HIGH (ref 0–44)
AST: 54 U/L — ABNORMAL HIGH (ref 15–41)
Albumin: 2.6 g/dL — ABNORMAL LOW (ref 3.5–5.0)
Alkaline Phosphatase: 131 U/L — ABNORMAL HIGH (ref 38–126)
Anion gap: 3 — ABNORMAL LOW (ref 5–15)
BUN: 25 mg/dL — ABNORMAL HIGH (ref 8–23)
CO2: 21 mmol/L — ABNORMAL LOW (ref 22–32)
Calcium: 8.1 mg/dL — ABNORMAL LOW (ref 8.9–10.3)
Chloride: 112 mmol/L — ABNORMAL HIGH (ref 98–111)
Creatinine, Ser: 1.78 mg/dL — ABNORMAL HIGH (ref 0.61–1.24)
GFR, Estimated: 37 mL/min — ABNORMAL LOW (ref >60)
Glucose, Bld: 101 mg/dL — ABNORMAL HIGH (ref 70–99)
Potassium: 3.9 mmol/L (ref 3.5–5.1)
Sodium: 136 mmol/L (ref 135–145)
Total Bilirubin: 0.7 mg/dL (ref 0.0–1.2)
Total Protein: 5.8 g/dL — ABNORMAL LOW (ref 6.5–8.1)

## 2023-06-11 MED ORDER — SODIUM CHLORIDE 0.9 % IV SOLN
2.0000 g | INTRAVENOUS | Status: DC
  Administered 2023-06-11 – 2023-06-12 (×2): 2 g via INTRAVENOUS
  Filled 2023-06-11 (×2): qty 20

## 2023-06-11 MED ORDER — ENOXAPARIN SODIUM 40 MG/0.4ML IJ SOSY
40.0000 mg | PREFILLED_SYRINGE | INTRAMUSCULAR | Status: DC
  Administered 2023-06-11 – 2023-06-12 (×2): 40 mg via SUBCUTANEOUS
  Filled 2023-06-11 (×2): qty 0.4

## 2023-06-11 MED ORDER — POLYETHYLENE GLYCOL 3350 17 G PO PACK
17.0000 g | PACK | Freq: Every day | ORAL | Status: DC
  Administered 2023-06-11: 17 g via ORAL
  Filled 2023-06-11: qty 1

## 2023-06-11 MED ORDER — SENNOSIDES-DOCUSATE SODIUM 8.6-50 MG PO TABS: 2.0000 | ORAL_TABLET | Freq: Every day | ORAL | Status: DC

## 2023-06-11 MED ORDER — BISACODYL 10 MG RE SUPP
10.0000 mg | Freq: Once | RECTAL | Status: DC
  Filled 2023-06-11: qty 1

## 2023-06-11 NOTE — Plan of Care (Signed)
  Problem: Acute Rehab PT Goals(only PT should resolve) Goal: Pt Will Go Supine/Side To Sit Outcome: Progressing Flowsheets (Taken 06/11/2023 1527) Pt will go Supine/Side to Sit:  with supervision  with contact guard assist Goal: Patient Will Transfer Sit To/From Stand Outcome: Progressing Flowsheets (Taken 06/11/2023 1527) Patient will transfer sit to/from stand: with supervision Goal: Pt Will Transfer Bed To Chair/Chair To Bed Outcome: Progressing Flowsheets (Taken 06/11/2023 1527) Pt will Transfer Bed to Chair/Chair to Bed: with supervision Goal: Pt Will Ambulate Outcome: Progressing Flowsheets (Taken 06/11/2023 1527) Pt will Ambulate:  > 125 feet  with supervision  with contact guard assist  with rolling walker   3:27 PM, 06/11/23 Walton Guppy, MPT Physical Therapist with [facility] [phone] office [phone] mobile phone

## 2023-06-11 NOTE — Progress Notes (Signed)
 Pt confused, pulled peripheral IV out from arm, catheter intact. Inserted new IV in left hand. Flushed, blood return, currently infusing. Pt high fall risk, bed alarm in place. Pt continues to ignore call light, gets out of bed setting off bed alarm. Will continue to monitor pt for remainder of shift.

## 2023-06-11 NOTE — Plan of Care (Signed)
  Problem: Health Behavior/Discharge Planning: Goal: Ability to manage health-related needs will improve Outcome: Progressing   Problem: Clinical Measurements: Goal: Ability to maintain clinical measurements within normal limits will improve Outcome: Progressing Goal: Will remain free from infection Outcome: Progressing Goal: Diagnostic test results will improve Outcome: Progressing   Problem: Activity: Goal: Risk for activity intolerance will decrease Outcome: Progressing   Problem: Nutrition: Goal: Adequate nutrition will be maintained Outcome: Progressing   Problem: Elimination: Goal: Will not experience complications related to bowel motility Outcome: Progressing Goal: Will not experience complications related to urinary retention Outcome: Progressing   Problem: Pain Managment: Goal: General experience of comfort will improve and/or be controlled Outcome: Progressing   Problem: Safety: Goal: Ability to remain free from injury will improve Outcome: Progressing   Problem: Skin Integrity: Goal: Risk for impaired skin integrity will decrease Outcome: Progressing

## 2023-06-11 NOTE — Progress Notes (Signed)
 Nurse at bedside,patient alert to person,and place,confused to time,and situation.Patient very hard of hearing.No c/o pan or discomfort noted,however patient's abdomen is very distended,Dr Courage notified. Plan of care on going.

## 2023-06-11 NOTE — Progress Notes (Signed)
   06/11/23 2120  Provider Notification  Provider Name/Title Dr. Elyse Hand  Date Provider Notified 06/11/23  Time Provider Notified 2120  Method of Notification Page  Notification Reason Critical Result  Test performed and critical result blood culture positve for gram negaqtive rods  Date Critical Result Received 06/11/23  Time Critical Result Received 2115  Provider response Other (Comment) (awaiting orders at this time)

## 2023-06-11 NOTE — Progress Notes (Signed)
 Nurse at bedside,patient assisted to and from bathroom using front wheel walker,patient tolerated ambulation,guarded touch assist..Assisted back to bed alarm set for bed, call bell in reach. Plan of care on going.

## 2023-06-11 NOTE — Plan of Care (Signed)
  Problem: Acute Rehab OT Goals (only OT should resolve) Goal: Pt. Will Perform Grooming Flowsheets (Taken 06/11/2023 1453) Pt Will Perform Grooming:  with modified independence  standing Goal: Pt. Will Perform Lower Body Bathing Flowsheets (Taken 06/11/2023 1453) Pt Will Perform Lower Body Bathing:  with modified independence  sitting/lateral leans Goal: Pt. Will Perform Lower Body Dressing Flowsheets (Taken 06/11/2023 1453) Pt Will Perform Lower Body Dressing:  with modified independence  sitting/lateral leans Goal: Pt. Will Transfer To Toilet Flowsheets (Taken 06/11/2023 1453) Pt Will Transfer to Toilet:  with modified independence  ambulating Goal: Pt. Will Perform Toileting-Clothing Manipulation Flowsheets (Taken 06/11/2023 1453) Pt Will Perform Toileting - Clothing Manipulation and hygiene: with modified independence Goal: Pt/Caregiver Will Perform Home Exercise Program Flowsheets (Taken 06/11/2023 1453) Pt/caregiver will Perform Home Exercise Program:  Increased strength  Both right and left upper extremity  Independently  Rozina Pointer OT, MOT

## 2023-06-11 NOTE — Evaluation (Signed)
 Physical Therapy Evaluation Patient Details Name: Jerry Rodriguez MRN: [id number] DOB: 07/20/1940 Today's Date: 06/11/2023  History of Present Illness  Jerry Rodriguez is a 83 y.o. male with medical history significant of essential hypertension, left carotid stenosis, hyperlipidemia, hearing loss, prior stroke without residual weakness, tobacco abuse, cervical spine fracture at age 3 who presents to the emergency department due to severe weakness with difficulty in being able to get up from sitting position and difficulty in being able to walk since yesterday.  Patient was able to help 2 feet decals with his son in the field a couple days ago.  Regular checkup with his PCP about 2 weeks ago went well.  There was no history of fall.  Patient was hard of hearing.  Patient presents with residual mild unilateral weakness and minimal right-sided facial droop due to prior stroke in 2012.   Clinical Impression  Patient demonstrates labored movement for sitting up at bedside, good return for transferring to/from chair using RW and ambulated in room/hallway using RW without loss of balance. Patient tolerated sitting up in chair after therapy - nurse notified. Patient will benefit from continued skilled physical therapy in hospital and recommended venue below to increase strength, balance, endurance for safe ADLs and gait.           If plan is discharge home, recommend the following: A little help with bathing/dressing/bathroom;A little help with walking and/or transfers;Direct supervision/assist for medications management;Help with stairs or ramp for entrance;Assistance with cooking/housework   Can travel by private vehicle        Equipment Recommendations None recommended by PT  Recommendations for Other Services       Functional Status Assessment Patient has had a recent decline in their functional status and demonstrates the ability to make significant improvements in function in a reasonable  and predictable amount of time.     Precautions / Restrictions Precautions Precautions: Fall Restrictions Weight Bearing Restrictions Per Provider Order: No      Mobility  Bed Mobility Overal bed mobility: Needs Assistance Bed Mobility: Supine to Sit     Supine to sit: Min assist     General bed mobility comments: increased time, labored movement    Transfers Overall transfer level: Needs assistance Equipment used: Rolling walker (2 wheels) Transfers: Sit to/from Stand, Bed to chair/wheelchair/BSC Sit to Stand: Contact guard assist Stand pivot transfers: Contact guard assist         General transfer comment: slightly labored movement    Ambulation/Gait Ambulation/Gait assistance: Contact guard assist Gait Distance (Feet): 75 Feet Assistive device: Rolling walker (2 wheels) Gait Pattern/deviations: Decreased step length - left, Decreased stance time - right, Decreased stride length Gait velocity: decreased     General Gait Details: slightly labored movemet with good return for ambulating in room, hallway without loss of balance  Stairs            Wheelchair Mobility     Tilt Bed    Modified Rankin (Stroke Patients Only)       Balance Overall balance assessment: Needs assistance Sitting-balance support: Feet unsupported, No upper extremity supported Sitting balance-Leahy Scale: Good Sitting balance - Comments: fair/good seated EOB   Standing balance support: During functional activity, Bilateral upper extremity supported Standing balance-Leahy Scale: Fair Standing balance comment: fair/good using RW                             Pertinent Vitals/Pain Pain Assessment  Pain Assessment: Faces Faces Pain Scale: No hurt    Home Living Family/patient expects to be discharged to:: Private residence Living Arrangements: Spouse/significant other Available Help at Discharge: Family;Available 24 hours/day Type of Home: House Home Access:  Stairs to enter Entrance Stairs-Rails: None Entrance Stairs-Number of Steps: 3 Alternate Level Stairs-Number of Steps: flight Home Layout: Two level;Able to live on main level with bedroom/bathroom Home Equipment: Rolling Walker (2 wheels);Cane - quad;Toilet riser      Prior Function Prior Level of Function : Needs assist       Physical Assist : ADLs (physical);Mobility (physical) Mobility (physical): Bed mobility;Transfers;Gait;Stairs ADLs (physical): IADLs Mobility Comments: Leans on furniture or uses quad cane for mobility. ADLs Comments: Independent ADL; uses depends at baseline; assist IADL     Extremity/Trunk Assessment   Upper Extremity Assessment Upper Extremity Assessment: Defer to OT evaluation    Lower Extremity Assessment Lower Extremity Assessment: Generalized weakness    Cervical / Trunk Assessment Cervical / Trunk Assessment: Kyphotic  Communication   Communication Communication: Impaired Factors Affecting Communication: Hearing impaired;Other (comment)    Cognition   Behavior During Therapy: WFL for tasks assessed/performed   PT - Cognitive impairments: No apparent impairments                         Following commands: Intact       Cueing Cueing Techniques: Gestural cues, Verbal cues     General Comments      Exercises     Assessment/Plan    PT Assessment Patient needs continued PT services  PT Problem List Decreased strength;Decreased activity tolerance;Decreased balance;Decreased mobility       PT Treatment Interventions DME instruction;Gait training;Stair training;Functional mobility training;Therapeutic activities;Therapeutic exercise;Balance training;Patient/family education    PT Goals (Current goals can be found in the Care Plan section)  Acute Rehab PT Goals Patient Stated Goal: return home with family to assist PT Goal Formulation: With patient Time For Goal Achievement: 06/14/23 Potential to Achieve Goals:  Good    Frequency Min 3X/week     Co-evaluation   Reason for Co-Treatment: To address functional/ADL transfers   OT goals addressed during session: ADL's and self-care       AM-PAC PT "6 Clicks" Mobility  Outcome Measure Help needed turning from your back to your side while in a flat bed without using bedrails?: A Little Help needed moving from lying on your back to sitting on the side of a flat bed without using bedrails?: A Little Help needed moving to and from a bed to a chair (including a wheelchair)?: A Little Help needed standing up from a chair using your arms (e.g., wheelchair or bedside chair)?: A Little Help needed to walk in hospital room?: A Little Help needed climbing 3-5 steps with a railing? : A Lot 6 Click Score: 17    End of Session   Activity Tolerance: Patient tolerated treatment well;Patient limited by fatigue Patient left: in chair;with call bell/phone within reach;with chair alarm set Nurse Communication: Mobility status PT Visit Diagnosis: Unsteadiness on feet (R26.81);Other abnormalities of gait and mobility (R26.89);Muscle weakness (generalized) (M62.81)    Time: 1610-9604 PT Time Calculation (min) (ACUTE ONLY): 20 min   Charges:   PT Evaluation $PT Eval Moderate Complexity: 1 Mod PT Treatments $Therapeutic Activity: 8-22 mins PT General Charges $$ ACUTE PT VISIT: 1 Visit         3:25 PM, 06/11/23 Walton Guppy, MPT Physical Therapist with [facility] [phone] office [phone] mobile phone

## 2023-06-11 NOTE — Clinical Note (Incomplete)
 Gram negative rods 2nd set blood culture

## 2023-06-11 NOTE — Progress Notes (Addendum)
 PROGRESS NOTE  Jerry Rodriguez, is a 84 y.o. male, DOB - 19-Jun-1940, MWU:[id number]  Admit date - 06/09/2023   Admitting Physician Twilla Galea, DO  Outpatient Primary MD for the patient is Artemisa Bile, MD  LOS - 1  Chief Complaint  Patient presents with   Weakness      Brief Narrative:  Brief Summary 83 y.o. male with medical history significant of essential hypertension, left carotid stenosis, hyperlipidemia, hearing loss, prior stroke without residual weakness, tobacco abuse, cervical spine fracture at age 63 admitted on 06/10/2023 with SIRS, AKI on CKD stage IV, transaminitis and generalized weakness with ambulatory dysfunction    -Assessment and Plan: 1)AKI----acute kidney injury on CKD stage - IV -- creatinine on admission= 2.21,  baseline creatinine = 1.4    ,  creatinine is now= trending down with hydration currently 1.78,  -Continue IV fluids -Stopped Ramipril  --renally adjust medications, avoid nephrotoxic agents / dehydration  / hypotension   2)Transaminitis - Stop atorvastatin  - LFTs started to trend down - CT abdomen and pelvis without acute hepatobiliary findings - Acute viral hepatitis profile Negative    Latest Ref Rng & Units 06/11/2023    4:30 AM 06/10/2023    5:39 AM 06/09/2023    9:00 PM  Hepatic Function  Total Protein 6.5 - 8.1 g/dL 5.8  5.6  7.5   Albumin 3.5 - 5.0 g/dL 2.6  2.5  3.6   AST 15 - 41 U/L 54  143  327   ALT 0 - 44 U/L 95  148  204   Alk Phosphatase 38 - 126 U/L 131  166  263   Total Bilirubin 0.0 - 1.2 mg/dL 0.7  1.0  2.9    3)Sepsis secondary to E. coli Bacteremia---POA -- blood cultures from 06/09/2023 with E. Coli WBC 17.8 >>11.0 -- Presented with leukocytosis, tachycardia and tachypnea   -CT abdomen and pelvis and chest x-ray without acute infectious findings - Okay to de-escalate to Rocephin  pending sensitivities - Zosyn  discontinued   4)Tobacco Abuse--- smoking cessation advised - c/n Nicotine  patch   5)HTN--continue  amlodipine , hold ramipril  due to #1 above   6)History of CVA--continue aspirin  for secondary prevention, hold atorvastatin  due to #2 above   7)Abnormal Lung finding on imaging ----new 7 mm right lower lobe nodule. Non-contrast chest CT at 6-12 months is recommended. If the nodule is stable at time of repeat CT, then future CT at 18-24 months (from today's scan) is considered optional for low-risk patients, but is recommended for high-risk patients   8)Generalized Weakness/Ambulatory Dysfunction--CT abdomen and pelvis shows Chronic appearing T12 compression fracture new from the prior exam  -- PT eval requested   9)Constipation--- had last BM on 06/11/2023, continue laxatives as ordered   Status is: Inpatient   Disposition: The patient is from: Home              Anticipated d/c is to: Home              Anticipated d/c date is: 1 day              Patient currently is not medically stable to d/c. Barriers: Not Clinically Stable-   Code Status :  -  Code Status: Full Code   Family Communication:    NA (patient is alert, awake and coherent)   DVT Prophylaxis  :   - SCDs  enoxaparin  (LOVENOX ) injection 40 mg Start: 06/11/23 1000 SCDs Start: 06/10/23 0413   Lab Results  Component Value  Date   PLT 150 06/10/2023    Inpatient Medications  Scheduled Meds:  amLODipine   5 mg Oral Daily   bisacodyl   10 mg Rectal Once   Chlorhexidine  Gluconate Cloth  6 each Topical Daily   enoxaparin  (LOVENOX ) injection  40 mg Subcutaneous Q24H   nicotine   21 mg Transdermal Daily   polyethylene glycol  17 g Oral Daily   senna-docusate  2 tablet Oral QHS   Continuous Infusions:  cefTRIAXone  (ROCEPHIN )  IV 2 g (06/11/23 1142)   PRN Meds:.acetaminophen  **OR** acetaminophen , ondansetron  **OR** ondansetron  (ZOFRAN ) IV, mouth rinse   Anti-infectives (From admission, onward)    Start     Dose/Rate Route Frequency Ordered Stop   06/11/23 1200  cefTRIAXone  (ROCEPHIN ) 2 g in sodium chloride  0.9 % 100 mL  IVPB        2 g 200 mL/hr over 30 Minutes Intravenous Every 24 hours 06/11/23 0750     06/10/23 2000  azithromycin  (ZITHROMAX ) 500 mg in sodium chloride  0.9 % 250 mL IVPB  Status:  Discontinued        500 mg 250 mL/hr over 60 Minutes Intravenous Every 24 hours 06/10/23 0743 06/11/23 0751   06/10/23 1100  piperacillin -tazobactam (ZOSYN ) IVPB 3.375 g  Status:  Discontinued        3.375 g 12.5 mL/hr over 240 Minutes Intravenous Every 8 hours 06/10/23 0913 06/11/23 0750   06/09/23 2245  piperacillin -tazobactam (ZOSYN ) IVPB 3.375 g        3.375 g 100 mL/hr over 30 Minutes Intravenous  Once 06/09/23 2238 06/10/23 0709   06/09/23 2100  cefTRIAXone  (ROCEPHIN ) 2 g in sodium chloride  0.9 % 100 mL IVPB        2 g 200 mL/hr over 30 Minutes Intravenous Once 06/09/23 2056 06/10/23 0117   06/09/23 2100  azithromycin  (ZITHROMAX ) 500 mg in sodium chloride  0.9 % 250 mL IVPB        500 mg 250 mL/hr over 60 Minutes Intravenous  Once 06/09/23 2056 06/10/23 0117       Subjective: Jerry Rodriguez today has no fevers, no emesis,  No chest pain,    -Oral intake is not great - Last BM on 06/11/2023 - Objective: Vitals:   06/10/23 1300 06/10/23 1953 06/11/23 0352 06/11/23 0834  BP: 120/67 110/65 123/70 136/84  Pulse: 74 88 66 68  Resp: 20 20 16    Temp: 98.8 F (37.1 C) 98.3 F (36.8 C) 99 F (37.2 C) 98 F (36.7 C)  TempSrc: Oral Oral Oral Oral  SpO2: 95% 95% 96% 95%  Weight:      Height:        Intake/Output Summary (Last 24 hours) at 06/11/2023 1237 Last data filed at 06/11/2023 1218 Gross per 24 hour  Intake 390 ml  Output 400 ml  Net -10 ml   Filed Weights   06/09/23 2055 06/10/23 0145  Weight: 81.6 kg 80 kg   Physical Exam Gen:- Awake Alert, no acute distress,, chronically ill-appearing HEENT:- [place].AT, No sclera icterus Ears--significant hearing loss  Neck-Supple Neck,No JVD,.  Lungs-  CTAB , fair symmetrical air movement CV- S1, S2 normal, regular  Abd-  +ve B.Sounds, Abd Soft,  No tenderness, no CVA area tenderness, somewhat distended Extremity/Skin:- No  edema, pedal pulses present  Psych-affect is appropriate, oriented x3 Neuro-generalized weakness, no new focal deficits, no tremors  Data Reviewed: I have personally reviewed following labs and imaging studies  CBC: Recent Labs  Lab 06/09/23 2100 06/10/23 0539  WBC 17.8* 11.0*  NEUTROABS 16.2*  --   HGB 17.0 13.3  HCT 49.5 40.2  MCV 100.6* 101.0*  PLT 242 150   Basic Metabolic Panel: Recent Labs  Lab 06/09/23 2100 06/10/23 0539 06/11/23 0430  NA 136 135 136  K 4.2 3.6 3.9  CL 104 110 112*  CO2 19* 18* 21*  GLUCOSE 187* 227* 101*  BUN 28* 27* 25*  CREATININE 2.21* 1.99* 1.78*  CALCIUM  9.1 8.1* 8.1*  MG  --  1.8  --   PHOS  --  1.4*  --    GFR: Estimated Creatinine Clearance: 35.5 mL/min (A) (by C-G formula based on SCr of 1.78 mg/dL (H)). Liver Function Tests: Recent Labs  Lab 06/09/23 2100 06/10/23 0539 06/11/23 0430  AST 327* 143* 54*  ALT 204* 148* 95*  ALKPHOS 263* 166* 131*  BILITOT 2.9* 1.0 0.7  PROT 7.5 5.6* 5.8*  ALBUMIN 3.6 2.5* 2.6*    Recent Results (from the past 240 hours)  Resp panel by RT-PCR (RSV, Flu A&B, Covid) Anterior Nasal Swab     Status: None   Collection Time: 06/09/23  8:56 PM   Specimen: Anterior Nasal Swab  Result Value Ref Range Status   SARS Coronavirus 2 by RT PCR NEGATIVE NEGATIVE Final    Comment: (NOTE) SARS-CoV-2 target nucleic acids are NOT DETECTED.  The SARS-CoV-2 RNA is generally detectable in upper respiratory specimens during the acute phase of infection. The lowest concentration of SARS-CoV-2 viral copies this assay can detect is 138 copies/mL. A negative result does not preclude SARS-Cov-2 infection and should not be used as the sole basis for treatment or other patient management decisions. A negative result may occur with  improper specimen collection/handling, submission of specimen other than nasopharyngeal swab, presence of  viral mutation(s) within the areas targeted by this assay, and inadequate number of viral copies(<138 copies/mL). A negative result must be combined with clinical observations, patient history, and epidemiological information. The expected result is Negative.  Fact Sheet for Patients:  BloggerCourse.com  Fact Sheet for Healthcare Providers:  SeriousBroker.it  This test is no t yet approved or cleared by the [place]  FDA and  has been authorized for detection and/or diagnosis of SARS-CoV-2 by FDA under an Emergency Use Authorization (EUA). This EUA will remain  in effect (meaning this test can be used) for the duration of the COVID-19 declaration under Section 564(b)(1) of the Act, 21 U.S.C.section 360bbb-3(b)(1), unless the authorization is terminated  or revoked sooner.       Influenza A by PCR NEGATIVE NEGATIVE Final   Influenza B by PCR NEGATIVE NEGATIVE Final    Comment: (NOTE) The Xpert Xpress SARS-CoV-2/FLU/RSV plus assay is intended as an aid in the diagnosis of influenza from Nasopharyngeal swab specimens and should not be used as a sole basis for treatment. Nasal washings and aspirates are unacceptable for Xpert Xpress SARS-CoV-2/FLU/RSV testing.  Fact Sheet for Patients: BloggerCourse.com  Fact Sheet for Healthcare Providers: SeriousBroker.it  This test is not yet approved or cleared by the [place]  FDA and has been authorized for detection and/or diagnosis of SARS-CoV-2 by FDA under an Emergency Use Authorization (EUA). This EUA will remain in effect (meaning this test can be used) for the duration of the COVID-19 declaration under Section 564(b)(1) of the Act, 21 U.S.C. section 360bbb-3(b)(1), unless the authorization is terminated or revoked.     Resp Syncytial Virus by PCR NEGATIVE NEGATIVE Final    Comment: (NOTE) Fact Sheet for  Patients: BloggerCourse.com  Fact Sheet  for Healthcare Providers: SeriousBroker.it  This test is not yet approved or cleared by the [place]  FDA and has been authorized for detection and/or diagnosis of SARS-CoV-2 by FDA under an Emergency Use Authorization (EUA). This EUA will remain in effect (meaning this test can be used) for the duration of the COVID-19 declaration under Section 564(b)(1) of the Act, 21 U.S.C. section 360bbb-3(b)(1), unless the authorization is terminated or revoked.  Performed at [facility], [address] [place], [place] [address]   Blood Culture (routine x 2)     Status: Abnormal (Preliminary result)   Collection Time: 06/09/23  9:00 PM   Specimen: Right Antecubital; Blood  Result Value Ref Range Status   Specimen Description   Final    RIGHT ANTECUBITAL Performed at [facility], [address], [place], [place] [address]    Special Requests   Final    BOTTLES DRAWN AEROBIC AND ANAEROBIC Blood Culture adequate volume Performed at [facility], [address], [place], [place] [address]    Culture  Setup Time   Final    GRAM NEGATIVE RODS AEROBIC BOTTLE ONLY Gram Stain Report Called to,Read Back By and Verified With: BRITTANY B. ON 06/10/2023 @13 :02 BY T. HAMER  CRITICAL RESULT CALLED TO, READ BACK BY AND VERIFIED WITH: L EVANS RN 06/10/2023 @ 2302 BY AB    Culture (A)  Final    ESCHERICHIA COLI SUSCEPTIBILITIES TO FOLLOW Performed at [facility], [address] [place], [place] [address]    Report Status PENDING  Incomplete  Blood Culture ID Panel (Reflexed)     Status: Abnormal   Collection Time: 06/09/23  9:00 PM  Result Value Ref Range Status   Enterococcus faecalis NOT DETECTED NOT DETECTED Final   Enterococcus Faecium NOT DETECTED NOT DETECTED Final   Listeria monocytogenes NOT DETECTED NOT DETECTED Final   Staphylococcus species NOT DETECTED NOT DETECTED Final   Staphylococcus  aureus (BCID) NOT DETECTED NOT DETECTED Final   Staphylococcus epidermidis NOT DETECTED NOT DETECTED Final   Staphylococcus lugdunensis NOT DETECTED NOT DETECTED Final   Streptococcus species NOT DETECTED NOT DETECTED Final   Streptococcus agalactiae NOT DETECTED NOT DETECTED Final   Streptococcus pneumoniae NOT DETECTED NOT DETECTED Final   Streptococcus pyogenes NOT DETECTED NOT DETECTED Final   A.calcoaceticus-baumannii NOT DETECTED NOT DETECTED Final   Bacteroides fragilis NOT DETECTED NOT DETECTED Final   Enterobacterales DETECTED (A) NOT DETECTED Final    Comment: Enterobacterales represent a large order of gram negative bacteria, not a single organism. CRITICAL RESULT CALLED TO, READ BACK BY AND VERIFIED WITH: L EVANS RN 06/10/2023 @ 2302 BY AB    Enterobacter cloacae complex NOT DETECTED NOT DETECTED Final   Escherichia coli DETECTED (A) NOT DETECTED Final    Comment: CRITICAL RESULT CALLED TO, READ BACK BY AND VERIFIED WITH: L EVANS RN 06/10/2023 @ 2302 BY AB    Klebsiella aerogenes NOT DETECTED NOT DETECTED Final   Klebsiella oxytoca NOT DETECTED NOT DETECTED Final   Klebsiella pneumoniae NOT DETECTED NOT DETECTED Final   Proteus species NOT DETECTED NOT DETECTED Final   Salmonella species NOT DETECTED NOT DETECTED Final   Serratia marcescens NOT DETECTED NOT DETECTED Final   Haemophilus influenzae NOT DETECTED NOT DETECTED Final   Neisseria meningitidis NOT DETECTED NOT DETECTED Final   Pseudomonas aeruginosa NOT DETECTED NOT DETECTED Final   Stenotrophomonas maltophilia NOT DETECTED NOT DETECTED Final   Candida albicans NOT DETECTED NOT DETECTED Final   Candida auris NOT DETECTED  NOT DETECTED Final   Candida glabrata NOT DETECTED NOT DETECTED Final   Candida krusei NOT DETECTED NOT DETECTED Final   Candida parapsilosis NOT DETECTED NOT DETECTED Final   Candida tropicalis NOT DETECTED NOT DETECTED Final   Cryptococcus neoformans/gattii NOT DETECTED NOT DETECTED Final    CTX-M ESBL NOT DETECTED NOT DETECTED Final   Carbapenem resistance IMP NOT DETECTED NOT DETECTED Final   Carbapenem resistance KPC NOT DETECTED NOT DETECTED Final   Carbapenem resistance NDM NOT DETECTED NOT DETECTED Final   Carbapenem resist OXA 48 LIKE NOT DETECTED NOT DETECTED Final   Carbapenem resistance VIM NOT DETECTED NOT DETECTED Final    Comment: Performed at [facility], [address], [place], [place] [address]  Blood Culture (routine x 2)     Status: None (Preliminary result)   Collection Time: 06/09/23  9:24 PM   Specimen: BLOOD RIGHT HAND  Result Value Ref Range Status   Specimen Description BLOOD RIGHT HAND  Final   Special Requests   Final    BOTTLES DRAWN AEROBIC AND ANAEROBIC Blood Culture adequate volume   Culture   Final    NO GROWTH 2 DAYS Performed at [facility], [address], [place], [place] [address]    Report Status PENDING  Incomplete  MRSA Next Gen by PCR, Nasal     Status: None   Collection Time: 06/10/23  1:01 AM   Specimen: Nasal Mucosa; Nasal Swab  Result Value Ref Range Status   MRSA by PCR Next Gen NOT DETECTED NOT DETECTED Final    Comment: (NOTE) The GeneXpert MRSA Assay (FDA approved for NASAL specimens only), is one component of a comprehensive MRSA colonization surveillance program. It is not intended to diagnose MRSA infection nor to guide or monitor treatment for MRSA infections. Test performance is not FDA approved in patients less than 27 years old. Performed at [facility], [address], [place], [place] [address]     Radiology Studies: CT ABDOMEN PELVIS WO CONTRAST Result Date: 06/09/2023 CLINICAL DATA:  Sepsis and weakness, initial encounter EXAM: CT ABDOMEN AND PELVIS WITHOUT CONTRAST TECHNIQUE: Multidetector CT imaging of the abdomen and pelvis was performed following the standard protocol without IV contrast. RADIATION DOSE REDUCTION: This exam was performed according to the departmental dose-optimization program which  includes automated exposure control, adjustment of the mA and/or kV according to patient size and/or use of iterative reconstruction technique. COMPARISON:  03/17/2021 FINDINGS: Lower chest: 7 mm nodular density in the right lower lobe is noted. This is new from the prior exam. Hepatobiliary: No focal liver abnormality is seen. No gallstones, gallbladder wall thickening, or biliary dilatation. Pancreas: Unremarkable. No pancreatic ductal dilatation or surrounding inflammatory changes. Spleen: Normal in size without focal abnormality. Adrenals/Urinary Tract: Adrenal glands are within normal limits. Kidneys demonstrate renal cystic change bilaterally stable from the prior exam. No follow-up is recommended. No obstructive changes are seen. The bladder is partially distended. Stomach/Bowel: The appendix is within normal limits. No obstructive or inflammatory changes of the colon are seen. Stomach and small bowel are within normal limits. Vascular/Lymphatic: Atherosclerotic calcifications of the abdominal aorta are noted similar to that seen on the prior exam. Some findings suspicious for chronic dissection are noted. No lymphadenopathy is noted. Reproductive: Prostate is unremarkable. Other: No abdominal wall hernia or abnormality. No abdominopelvic ascites. Musculoskeletal: T12 compression fracture is noted new from the prior exam but of uncertain chronicity. The overall appearance suggests a chronic nature. IMPRESSION: Chronic appearing T12 compression fracture  new from the prior exam. New 7 mm right lower lobe nodule. Non-contrast chest CT at 6-12 months is recommended. If the nodule is stable at time of repeat CT, then future CT at 18-24 months (from today's scan) is considered optional for low-risk patients, but is recommended for high-risk patients. This recommendation follows the consensus statement: Guidelines for Management of Incidental Pulmonary Nodules Detected on CT Images: From the Fleischner Society 2017;  Radiology 2017; 284:228-243. Electronically Signed   By: Violeta Grey M.D.   On: 06/09/2023 23:14   DG Chest Port 1 View Result Date: 06/09/2023 CLINICAL DATA:  Possible sepsis EXAM: PORTABLE CHEST 1 VIEW COMPARISON:  03/17/2021 FINDINGS: Cardiac shadow is stable. Aortic calcifications are seen. Lungs are well aerated bilaterally. No focal infiltrate or effusion is noted. No bony abnormality is seen. IMPRESSION: No active disease. Electronically Signed   By: Violeta Grey M.D.   On: 06/09/2023 21:22   Scheduled Meds:  amLODipine   5 mg Oral Daily   bisacodyl   10 mg Rectal Once   Chlorhexidine  Gluconate Cloth  6 each Topical Daily   enoxaparin  (LOVENOX ) injection  40 mg Subcutaneous Q24H   nicotine   21 mg Transdermal Daily   polyethylene glycol  17 g Oral Daily   senna-docusate  2 tablet Oral QHS   Continuous Infusions:  cefTRIAXone  (ROCEPHIN )  IV 2 g (06/11/23 1142)    LOS: 1 day   Colin Dawley M.D on 06/11/2023 at 12:37 PM  Go to [email or web address] - for contact info  [facility] - Office  [phone]  If 7PM-7AM, please contact night-coverage [email or web address] 06/11/2023, 12:37 PM

## 2023-06-11 NOTE — Evaluation (Signed)
 Occupational Therapy Evaluation Patient Details Name: Jerry Rodriguez MRN: [id number] DOB: [birth year] Today's Date: 06/11/2023   History of Present Illness   Jerry Rodriguez is a 83 y.o. male with medical history significant of essential hypertension, left carotid stenosis, hyperlipidemia, hearing loss, prior stroke without residual weakness, tobacco abuse, cervical spine fracture at age 71 who presents to the emergency department due to severe weakness with difficulty in being able to get up from sitting position and difficulty in being able to walk since yesterday.  Patient was able to help 2 feet decals with his son in the field a couple days ago.  Regular checkup with his PCP about 2 weeks ago went well.  There was no history of fall.  Patient was hard of hearing.  Patient presents with residual mild unilateral weakness and minimal right-sided facial droop due to prior stroke in 2012. (per DO)     Clinical Impressions Pt agreeable to OT evaluation. Wife present and providing all information. Reports that she assists pt as baseline as needed. Pt required CGA for transfer to chair and back without AD. B UE generally weak but good functional use. Wife assist pt with lower body dressing at baseline. Pt left in the bed with call bell within reach and spouse present. Pt will benefit from continued OT in the hospital and recommended venue below to increase strength, balance, and endurance for safe ADL's.        If plan is discharge home, recommend the following:   A little help with walking and/or transfers;A lot of help with bathing/dressing/bathroom;Assistance with cooking/housework;Help with stairs or ramp for entrance;Assist for transportation     Functional Status Assessment   Patient has had a recent decline in their functional status and demonstrates the ability to make significant improvements in function in a reasonable and predictable amount of time.     Equipment  Recommendations   Tub/shower seat             Precautions/Restrictions   Precautions Precautions: Fall Recall of Precautions/Restrictions: Intact Restrictions Weight Bearing Restrictions Per Provider Order: No     Mobility Bed Mobility Overal bed mobility: Needs Assistance Bed Mobility: Supine to Sit     Supine to sit: Min assist          Transfers Overall transfer level: Needs assistance   Transfers: Sit to/from Stand, Bed to chair/wheelchair/BSC Sit to Stand: Contact guard assist Stand pivot transfers: Contact guard assist         General transfer comment: CGA for safety; leaning on chair for stand pivot      Balance Overall balance assessment: Needs assistance Sitting-balance support: No upper extremity supported, Feet supported Sitting balance-Leahy Scale: Fair Sitting balance - Comments: fair to good seated at EOB   Standing balance support: During functional activity, Single extremity supported Standing balance-Leahy Scale: Poor Standing balance comment: poor to fair; leaning on chair for stand pivot.                           ADL either performed or assessed with clinical judgement   ADL Overall ADL's : Needs assistance/impaired     Grooming: Set up;Sitting   Upper Body Bathing: Set up;Sitting   Lower Body Bathing: Maximal assistance;Sitting/lateral leans   Upper Body Dressing : Set up;Sitting   Lower Body Dressing: Maximal assistance;Sitting/lateral leans   Toilet Transfer: Contact guard assist;Stand-pivot Toilet Transfer Details (indicate cue type and reason): Simulated via EOB to  chair without AD Toileting- Clothing Manipulation and Hygiene: Minimal assistance;Sitting/lateral lean               Vision Baseline Vision/History: 1 Wears glasses Ability to See in Adequate Light: 1 Impaired Patient Visual Report: No change from baseline Vision Assessment?: No apparent visual deficits     Perception Perception: Not  tested       Praxis Praxis: Not tested       Pertinent Vitals/Pain Pain Assessment Pain Assessment: Faces Faces Pain Scale: No hurt     Extremity/Trunk Assessment Upper Extremity Assessment Upper Extremity Assessment: Generalized weakness   Lower Extremity Assessment Lower Extremity Assessment: Defer to PT evaluation   Cervical / Trunk Assessment Cervical / Trunk Assessment: Kyphotic   Communication Communication Communication: Impaired Factors Affecting Communication: Hearing impaired;Other (comment) (Wife reports that the pt does not hear.)   Cognition Arousal: Alert Behavior During Therapy: WFL for tasks assessed/performed Cognition: No apparent impairments                               Following commands: Intact       Cueing  General Comments   Cueing Techniques: Gestural cues;Verbal cues                 Home Living Family/patient expects to be discharged to:: Private residence Living Arrangements: Spouse/significant other Available Help at Discharge: Family;Available 24 hours/day Type of Home: House Home Access: Stairs to enter [employer] of Steps: 3 Entrance Stairs-Rails: None Home Layout: Two level;Able to live on main level with bedroom/bathroom     Bathroom Shower/Tub: Producer, television/film/video: Standard     Home Equipment: Agricultural consultant (2 wheels);Cane - quad;Toilet riser          Prior Functioning/Environment Prior Level of Function : Needs assist       Physical Assist : ADLs (physical)   ADLs (physical): IADLs Mobility Comments: Leans on furniture or uses quad cane for mobility. ADLs Comments: Independent ADL; uses depends at baseline; assist IADL    OT Problem List: Decreased strength;Impaired balance (sitting and/or standing)   OT Treatment/Interventions: Self-care/ADL training;Therapeutic exercise;Therapeutic activities;Patient/family education;DME and/or AE instruction      OT  Goals(Current goals can be found in the care plan section)   Acute Rehab OT Goals Patient Stated Goal: return home OT Goal Formulation: With family Time For Goal Achievement: 06/25/23 Potential to Achieve Goals: Good   OT Frequency:  Min 1X/week    Co-evaluation PT/OT/SLP Co-Evaluation/Treatment: Yes Reason for Co-Treatment: To address functional/ADL transfers   OT goals addressed during session: ADL's and self-care                       End of Session    Activity Tolerance: Patient tolerated treatment well Patient left: in bed;with call bell/phone within reach;with family/visitor present  OT Visit Diagnosis: Unsteadiness on feet (R26.81);Other abnormalities of gait and mobility (R26.89);Muscle weakness (generalized) (M62.81)                Time: 0454-0981 OT Time Calculation (min): 27 min Charges:  OT General Charges $OT Visit: 1 Visit OT Evaluation $OT Eval Low Complexity: 1 Low  Jaiyon Wander OT, MOT  Thurnell Floss 06/11/2023, 2:50 PM

## 2023-06-11 NOTE — TOC Initial Note (Signed)
 Transition of Care [facility]) - Initial/Assessment Note    Patient Details  Name: Jerry Rodriguez MRN: [id number] Date of Birth: 1940-02-09  [facility] CM/SW Contact:    Ander Katos, LCSW Phone Number: 06/11/2023, 3:10 PM  Clinical Narrative: Pt lives with his wife and is fairly independent with ADLs. He has a walker and cane at home if needed. PT/OT evaluated pt and recommend HHPT/OT. Discussed with pt's wife at bedside. She requests OOPT only at [facility]. Referral sent. OT recommending shower seat. Wife indicated during conversation she was going to look for one because it needed to be small for their shower. LCSW left voicemail for pt's wife as she has now left hospital to see if shower seat needs to be ordered.                  Expected Discharge Plan: OP Rehab Barriers to Discharge: Continued Medical Work up   Patient Goals and CMS Choice Patient states their goals for this hospitalization and ongoing recovery are:: return home   Choice offered to / list presented to : Spouse [place] ownership interest in [facility].provided to::  (n/a)    Expected Discharge Plan and Services       Living arrangements for the past 2 months: Single Family Home                                      Prior Living Arrangements/Services Living arrangements for the past 2 months: Single Family Home Lives with:: Spouse Patient language and need for interpreter reviewed:: Yes Do you feel safe going back to the place where you live?: Yes      Need for Family Participation in Patient Care: Yes (Comment) Care giver support system in place?: Yes (comment) Current home services: DME (walker, cane) Criminal Activity/Legal Involvement Pertinent to Current Situation/Hospitalization: No - Comment as needed  Activities of Daily Living      Permission Sought/Granted                  Emotional Assessment         Alcohol /  Substance Use: Not Applicable Psych Involvement: No (comment)  Admission diagnosis:  SIRS (systemic inflammatory response syndrome) (HCC) [R65.10] Septic shock (HCC) [A41.9, R65.21] Acute renal failure, unspecified acute renal failure type (HCC) [N17.9] Patient Active Problem List   Diagnosis Date Noted   SIRS (systemic inflammatory response syndrome) (HCC) 06/10/2023   Lactic acidosis 06/10/2023   Transaminitis 06/10/2023   Hypophosphatemia 06/10/2023   History of CVA (cerebrovascular accident) 06/10/2023   Generalized weakness 03/18/2021   Diarrhea 03/18/2021   Elevated MCV 03/18/2021   Acute kidney injury superimposed on stage 4 chronic kidney disease (HCC) 03/18/2021   Essential hypertension 03/18/2021   Mixed hyperlipidemia 03/18/2021   Tobacco abuse 03/18/2021   Noncompliance with medication regimen 03/18/2021   Cervical stenosis of spinal canal 03/17/2021   HEMOCCULT POSITIVE STOOL 04/20/2008   PCP:  Artemisa Bile, MD Pharmacy:   [facility] [place], [place] - [address] [place] [address] Phone: [phone] Fax: [phone]     Social Drivers of Health (SDOH) Social History: SDOH Screenings   Food Insecurity: No Food Insecurity (06/10/2023)  Housing: Low Risk  (06/10/2023)  Transportation Needs: No Transportation Needs (06/10/2023)  Utilities: Not At Risk (06/10/2023)  Social Connections: Moderately Isolated (06/10/2023)  Tobacco  Use: High Risk (09/28/2021)   SDOH Interventions:     Readmission Risk Interventions     No data to display

## 2023-06-12 DIAGNOSIS — R6510 Systemic inflammatory response syndrome (SIRS) of non-infectious origin without acute organ dysfunction: Secondary | ICD-10-CM | POA: Diagnosis not present

## 2023-06-12 LAB — CULTURE, BLOOD (ROUTINE X 2): Special Requests: ADEQUATE

## 2023-06-12 LAB — HEPATIC FUNCTION PANEL
ALT: 67 U/L — ABNORMAL HIGH (ref 0–44)
AST: 29 U/L (ref 15–41)
Albumin: 2.7 g/dL — ABNORMAL LOW (ref 3.5–5.0)
Alkaline Phosphatase: 118 U/L (ref 38–126)
Bilirubin, Direct: 0.1 mg/dL (ref 0.0–0.2)
Indirect Bilirubin: 0.8 mg/dL (ref 0.3–0.9)
Total Bilirubin: 0.9 mg/dL (ref 0.0–1.2)
Total Protein: 5.9 g/dL — ABNORMAL LOW (ref 6.5–8.1)

## 2023-06-12 LAB — CBC
HCT: 39.2 % (ref 39.0–52.0)
Hemoglobin: 12.8 g/dL — ABNORMAL LOW (ref 13.0–17.0)
MCH: 33 pg (ref 26.0–34.0)
MCHC: 32.7 g/dL (ref 30.0–36.0)
MCV: 101 fL — ABNORMAL HIGH (ref 80.0–100.0)
Platelets: 176 10*3/uL (ref 150–400)
RBC: 3.88 MIL/uL — ABNORMAL LOW (ref 4.22–5.81)
RDW: 13.5 % (ref 11.5–15.5)
WBC: 8.2 10*3/uL (ref 4.0–10.5)
nRBC: 0 % (ref 0.0–0.2)

## 2023-06-12 LAB — RENAL FUNCTION PANEL
Albumin: 2.7 g/dL — ABNORMAL LOW (ref 3.5–5.0)
Anion gap: 3 — ABNORMAL LOW (ref 5–15)
BUN: 20 mg/dL (ref 8–23)
CO2: 22 mmol/L (ref 22–32)
Calcium: 8.2 mg/dL — ABNORMAL LOW (ref 8.9–10.3)
Chloride: 111 mmol/L (ref 98–111)
Creatinine, Ser: 1.62 mg/dL — ABNORMAL HIGH (ref 0.61–1.24)
GFR, Estimated: 42 mL/min — ABNORMAL LOW (ref >60)
Glucose, Bld: 82 mg/dL (ref 70–99)
Phosphorus: 2.5 mg/dL (ref 2.5–4.6)
Potassium: 4 mmol/L (ref 3.5–5.1)
Sodium: 136 mmol/L (ref 135–145)

## 2023-06-12 MED ORDER — ASPIRIN 81 MG PO TABS: 81.0000 mg | ORAL_TABLET | Freq: Every day | ORAL | 5 refills | Status: AC

## 2023-06-12 MED ORDER — CEFADROXIL 500 MG PO CAPS
1000.0000 mg | ORAL_CAPSULE | Freq: Two times a day (BID) | ORAL | 0 refills | Status: AC
Start: 2023-06-12 — End: 2023-06-22

## 2023-06-12 MED ORDER — SENNOSIDES-DOCUSATE SODIUM 8.6-50 MG PO TABS
2.0000 | ORAL_TABLET | Freq: Every day | ORAL | 2 refills | Status: DC
Start: 2023-06-12 — End: 2023-09-20

## 2023-06-12 MED ORDER — LACTINEX PO CHEW
1.0000 | CHEWABLE_TABLET | Freq: Three times a day (TID) | ORAL | 0 refills | Status: AC
Start: 2023-06-12 — End: 2023-06-22

## 2023-06-12 MED ORDER — ACETAMINOPHEN 325 MG PO TABS: 650.0000 mg | ORAL_TABLET | Freq: Four times a day (QID) | ORAL | Status: DC | PRN

## 2023-06-12 MED ORDER — POLYETHYLENE GLYCOL 3350 17 G PO PACK: 17.0000 g | PACK | Freq: Every day | ORAL | 2 refills | Status: DC

## 2023-06-12 MED ORDER — AMLODIPINE BESYLATE 5 MG PO TABS: 5.0000 mg | ORAL_TABLET | Freq: Every day | ORAL | 5 refills | Status: DC

## 2023-06-12 MED ORDER — NICOTINE 21 MG/24HR TD PT24: 21.0000 mg | MEDICATED_PATCH | Freq: Every day | TRANSDERMAL | 0 refills | Status: AC

## 2023-06-12 NOTE — Plan of Care (Signed)
  Problem: Clinical Measurements: Goal: Will remain free from infection Outcome: Progressing   Problem: Activity: Goal: Risk for activity intolerance will decrease Outcome: Progressing   Problem: Pain Managment: Goal: General experience of comfort will improve and/or be controlled Outcome: Progressing

## 2023-06-12 NOTE — Care Management Important Message (Signed)
 Important Message  Patient Details  Name: Jerry Rodriguez MRN: [id number] Date of Birth: 1940-03-19   Important Message Given:  N/A - LOS <3 / Initial given by admissions     Jerry Rodriguez 06/12/2023, 10:03 AM

## 2023-06-12 NOTE — Discharge Instructions (Signed)
 1)Repeat CMP blood test in 1 week advised 2)Avoid ibuprofen/Advil/Aleve/Motrin/Goody Powders/Naproxen/BC powders/Meloxicam/Diclofenac/Indomethacin and other Nonsteroidal anti-inflammatory medications as these will make you more likely to bleed and can cause stomach ulcers, can also cause Kidney problems.  3)Very Low-salt diet advised---Less than 2 gm of Sodium per day advised----ok to use Mrs DASH salt substitute instead of Salt

## 2023-06-12 NOTE — Progress Notes (Signed)
 Patient discharges home with instructions given on medications and follow up visits,patient verbalized understanding.IV discontinued,catheter intact.Prescriptions sent to Pharmacy of choice documented on AVS.Accompanied by staff to an awaiting vehicle.

## 2023-06-12 NOTE — Progress Notes (Signed)
 Mobility Specialist Progress Note:    06/12/23 1551  Mobility  Activity Stood at bedside;Transferred from bed to chair  Level of Assistance Contact guard assist, steadying assist  Assistive Device None;Front wheel walker  Distance Ambulated (ft) 10 ft  Range of Motion/Exercises Active;All extremities  Activity Response Tolerated well  Mobility visit 1 Mobility   Pt received sitting EOB with wife. Required CGA to stand and ambulate with RW. Tolerated well, asx throughout. Left pt with NT, all needs met.  Glinda Lapping Mobility Specialist Please contact via Special educational needs teacher or  Rehab office at [phone]

## 2023-06-12 NOTE — Progress Notes (Signed)
 Nurse at bedside,patient alert to person,and place,confused to time and situation.Patient assisted to and from bathroom using front wheel walker,contact guard assist.No c/o pain or discomfort noted. Plan of care on going.

## 2023-06-12 NOTE — Discharge Summary (Signed)
 Jerry Rodriguez, is a 83 y.o. male  DOB 03-14-1940  MRN [id number].  Admission date:  06/09/2023  Admitting Physician  Twilla Galea, DO  Discharge Date:  06/12/2023   Primary MD  Artemisa Bile, MD  Recommendations for primary care physician for things to follow:  1)Repeat CMP blood test in 1 week advised 2)Avoid ibuprofen/Advil/Aleve/Motrin/Goody Powders/Naproxen/BC powders/Meloxicam/Diclofenac/Indomethacin and other Nonsteroidal anti-inflammatory medications as these will make you more likely to bleed and can cause stomach ulcers, can also cause Kidney problems.  3)Very Low-salt diet advised---Less than 2 gm of Sodium per day advised----ok to use Mrs DASH salt substitute instead of Salt  Admission Diagnosis  SIRS (systemic inflammatory response syndrome) (HCC) [R65.10] Septic shock (HCC) [A41.9, R65.21] Acute renal failure, unspecified acute renal failure type (HCC) [N17.9]   Discharge Diagnosis  SIRS (systemic inflammatory response syndrome) (HCC) [R65.10] Septic shock (HCC) [A41.9, R65.21] Acute renal failure, unspecified acute renal failure type (HCC) [N17.9]    Principal Problem:   SIRS (systemic inflammatory response syndrome) (HCC) Active Problems:   Acute kidney injury superimposed on stage 4 chronic kidney disease (HCC)   Essential hypertension   Mixed hyperlipidemia   Tobacco abuse   Lactic acidosis   Transaminitis   Hypophosphatemia   History of CVA (cerebrovascular accident)      Past Medical History:  Diagnosis Date   Carotid artery occlusion    COPD (chronic obstructive pulmonary disease) (HCC)    Hypertension    Peripheral vascular disease (HCC)    Stroke (HCC)      HPI  from the history and physical done on the day of admission:    HPI: Jerry Rodriguez is a 83 y.o. male with medical history significant of essential hypertension, left carotid stenosis, hyperlipidemia, hearing  loss, prior stroke without residual weakness, tobacco abuse, cervical spine fracture at age 65 who presents to the emergency department due to severe weakness with difficulty in being able to get up from sitting position and difficulty in being able to walk since yesterday.  Patient was able to help 2 feet decals with his son in the field a couple days ago.  Regular checkup with his PCP about 2 weeks ago went well.  There was no history of fall.  Patient was hard of hearing. Patient presents with residual mild unilateral weakness and minimal right-sided facial droop due to prior stroke in 2012.   ED Course:  In the emergency department, patient was tachypneic, tachycardic and BP was soft at 89/59.  Workup in the ED showed normal CBC except for leukocytosis with a WBC of 17.8.  Lactic acid 4.1 > 4.2 > 3.7 BMP was positive for bicarb of 15, blood glucose 2.7, BUN/creatinine 27/1.99, albumin 2.5, AST 143, ALT 148, ALP 166, GFR 33, CKD 3B, phosphorus 1.4.  Blood culture was pending.  Influenza A, B, SARS coronavirus.,  RSV was negative. CT abdomen and pelvis without contrast showed chronic appearing T12 compression fracture. New 7 mm right lower lobe nodule. Patient was empirically treated with IV ceftriaxone  and azithromycin   due to presumed CAP.  IV Solu-Medrol  125 mg x 1 was given, IV Zosyn  was given, IV hydration per sepsis protocol was provided.  [facility] was asked to admit patient.     Review of Systems: Review of systems as noted in the HPI. All other systems reviewed and are negative.   Hospital Course:   Brief Summary 84 y.o. male with medical history significant of essential hypertension, left carotid stenosis, hyperlipidemia, hearing loss, prior stroke without residual weakness, tobacco abuse, cervical spine fracture at age 83 admitted on 06/10/2023 with SIRS, AKI on CKD stage IV, transaminitis and generalized weakness with ambulatory dysfunction     -Assessment and Plan: 1)AKI----acute kidney  injury on CKD stage - IV -- creatinine on admission= 2.21,  baseline creatinine = 1.4    ,  creatinine is now= trending down with hydration currently 1.62,  - Creatinine improved with hydration --renally adjust medications, avoid nephrotoxic agents / dehydration  / hypotension - Repeat CMP with PCP in a week advised   2)Transaminitis--suspect due to acute infection - LFTs started to trend down--so okay to restart atorvastatin  - CT abdomen and pelvis without acute hepatobiliary findings - Acute viral hepatitis profile Negative    Latest Ref Rng & Units 06/12/2023    4:11 AM 06/11/2023    4:30 AM 06/10/2023    5:39 AM  Hepatic Function  Total Protein 6.5 - 8.1 g/dL 5.9  5.8  5.6   Albumin 3.5 - 5.0 g/dL 3.5 - 5.0 g/dL 2.7    2.7  2.6  2.5   AST 15 - 41 U/L 29  54  143   ALT 0 - 44 U/L 67  95  148   Alk Phosphatase 38 - 126 U/L 118  131  166   Total Bilirubin 0.0 - 1.2 mg/dL 0.9  0.7  1.0   Bilirubin, Direct 0.0 - 0.2 mg/dL 0.1      3)Sepsis secondary to E. coli Bacteremia---POA -- blood cultures from 06/09/2023 with E. Coli WBC 17.8 >>11.0>>8.2 -- Presented with leukocytosis, tachycardia and tachypnea   -Sepsis pathophysiology has resolved -CT abdomen and pelvis and chest x-ray without acute infectious findings -Initially treated with IV Zosyn  -De-escalated to IV Rocephin  -ID pharmacy recommends discharge on cefadroxil 1 g twice daily for 10 days (renal function appears to be improving) - Okay to de-escalate to Rocephin  pending sensitivities - Zosyn  discontinued   4)Tobacco Abuse--- smoking cessation advised - c/n Nicotine  patch   5)HTN--continue amlodipine  and ramipril   6)History of CVA--continue aspirin  and atorvastatin  for secondary prevention,     7)Abnormal Lung finding on imaging ----new 7 mm right lower lobe nodule.  -Non-contrast chest CT at 6-12 months is recommended. - If the nodule is stable at time of repeat CT, then future CT at 18-24 months (from today's  scan) is considered optional for low-risk patients, but is recommended for high-risk patients - 8)Generalized Weakness/Ambulatory Dysfunction--CT abdomen and pelvis shows Chronic appearing T12 compression fracture new from the prior exam  -- PT eval appreciated patient would like to do outpatient PT   9)Constipation--- having large BMs with laxatives,  Disposition: The patient is from: Home              Anticipated d/c is to: Home  Discharge Condition: Stable,  Follow UP   Follow-up Information     Artemisa Bile, MD Follow up in 1 week(s).   Specialty: Internal Medicine Why: Repeat CMP with PCP in 1 week Contact information: [address]  [place] [address] [phone]                Diet and Activity recommendation:  As advised  Discharge Instructions    Discharge Instructions     Ambulatory referral to Physical Therapy   Complete by: As directed    Call MD for:  difficulty breathing, headache or visual disturbances   Complete by: As directed    Call MD for:  persistant dizziness or light-headedness   Complete by: As directed    Call MD for:  persistant nausea and vomiting   Complete by: As directed    Call MD for:  temperature >100.4   Complete by: As directed    Diet - low sodium heart healthy   Complete by: As directed    Discharge instructions   Complete by: As directed    1)Repeat CMP blood test in 1 week advised 2)Avoid ibuprofen/Advil/Aleve/Motrin/Goody Powders/Naproxen/BC powders/Meloxicam/Diclofenac/Indomethacin and other Nonsteroidal anti-inflammatory medications as these will make you more likely to bleed and can cause stomach ulcers, can also cause Kidney problems.  3)Very Low-salt diet advised---Less than 2 gm of Sodium per day advised----ok to use Mrs DASH salt substitute instead of Salt   Increase activity slowly   Complete by: As directed        Discharge Medications     Allergies as of 06/12/2023   No Known Allergies       Medication List     TAKE these medications    acetaminophen  325 MG tablet Commonly known as: TYLENOL  Take 2 tablets (650 mg total) by mouth every 6 (six) hours as needed for mild pain (pain score 1-3) (or Fever >/= 101).   amLODipine  5 MG tablet Commonly known as: NORVASC  Take 1 tablet (5 mg total) by mouth daily.   aspirin  81 MG tablet Take 1 tablet (81 mg total) by mouth daily with breakfast. What changed: when to take this   atorvastatin  20 MG tablet Commonly known as: LIPITOR Take 20 mg by mouth daily.   Biotin 1 MG Caps Take 1 capsule by mouth daily.   cefadroxil 500 MG capsule Commonly known as: DURICEF Take 2 capsules (1,000 mg total) by mouth 2 (two) times daily for 10 days.   cyanocobalamin  1000 MCG tablet Commonly known as: VITAMIN B12 Take 1 tablet (1,000 mcg total) by mouth daily.   lactobacillus acidophilus & bulgar chewable tablet Chew 1 tablet by mouth 3 (three) times daily with meals for 10 days.   nicotine  21 mg/24hr patch Commonly known as: NICODERM CQ  - dosed in mg/24 hours Place 1 patch (21 mg total) onto the skin daily. Start taking on: Jun 13, 2023   polyethylene glycol 17 g packet Commonly known as: MIRALAX  / GLYCOLAX  Take 17 g by mouth daily. Start taking on: Jun 13, 2023   ramipril  5 MG capsule Commonly known as: ALTACE  Take 1 capsule by mouth daily.   senna-docusate 8.6-50 MG tablet Commonly known as: Senokot-S Take 2 tablets by mouth at bedtime.       Major procedures and Radiology Reports - PLEASE review detailed and final reports for all details, in brief -   CT ABDOMEN PELVIS WO CONTRAST Result Date: 06/09/2023 CLINICAL DATA:  Sepsis and weakness, initial encounter EXAM: CT ABDOMEN AND PELVIS WITHOUT CONTRAST TECHNIQUE: Multidetector CT imaging of the abdomen and pelvis was performed following the standard protocol without IV contrast. RADIATION DOSE REDUCTION: This exam was performed according to the departmental  dose-optimization program which includes automated exposure control,  adjustment of the mA and/or kV according to patient size and/or use of iterative reconstruction technique. COMPARISON:  03/17/2021 FINDINGS: Lower chest: 7 mm nodular density in the right lower lobe is noted. This is new from the prior exam. Hepatobiliary: No focal liver abnormality is seen. No gallstones, gallbladder wall thickening, or biliary dilatation. Pancreas: Unremarkable. No pancreatic ductal dilatation or surrounding inflammatory changes. Spleen: Normal in size without focal abnormality. Adrenals/Urinary Tract: Adrenal glands are within normal limits. Kidneys demonstrate renal cystic change bilaterally stable from the prior exam. No follow-up is recommended. No obstructive changes are seen. The bladder is partially distended. Stomach/Bowel: The appendix is within normal limits. No obstructive or inflammatory changes of the colon are seen. Stomach and small bowel are within normal limits. Vascular/Lymphatic: Atherosclerotic calcifications of the abdominal aorta are noted similar to that seen on the prior exam. Some findings suspicious for chronic dissection are noted. No lymphadenopathy is noted. Reproductive: Prostate is unremarkable. Other: No abdominal wall hernia or abnormality. No abdominopelvic ascites. Musculoskeletal: T12 compression fracture is noted new from the prior exam but of uncertain chronicity. The overall appearance suggests a chronic nature. IMPRESSION: Chronic appearing T12 compression fracture new from the prior exam. New 7 mm right lower lobe nodule. Non-contrast chest CT at 6-12 months is recommended. If the nodule is stable at time of repeat CT, then future CT at 18-24 months (from today's scan) is considered optional for low-risk patients, but is recommended for high-risk patients. This recommendation follows the consensus statement: Guidelines for Management of Incidental Pulmonary Nodules Detected on CT Images:  From the Fleischner Society 2017; Radiology 2017; 284:228-243. Electronically Signed   By: Violeta Grey M.D.   On: 06/09/2023 23:14   DG Chest Port 1 View Result Date: 06/09/2023 CLINICAL DATA:  Possible sepsis EXAM: PORTABLE CHEST 1 VIEW COMPARISON:  03/17/2021 FINDINGS: Cardiac shadow is stable. Aortic calcifications are seen. Lungs are well aerated bilaterally. No focal infiltrate or effusion is noted. No bony abnormality is seen. IMPRESSION: No active disease. Electronically Signed   By: Violeta Grey M.D.   On: 06/09/2023 21:22   Micro Results  Recent Results (from the past 240 hours)  Resp panel by RT-PCR (RSV, Flu A&B, Covid) Anterior Nasal Swab     Status: None   Collection Time: 06/09/23  8:56 PM   Specimen: Anterior Nasal Swab  Result Value Ref Range Status   SARS Coronavirus 2 by RT PCR NEGATIVE NEGATIVE Final    Comment: (NOTE) SARS-CoV-2 target nucleic acids are NOT DETECTED.  The SARS-CoV-2 RNA is generally detectable in upper respiratory specimens during the acute phase of infection. The lowest concentration of SARS-CoV-2 viral copies this assay can detect is 138 copies/mL. A negative result does not preclude SARS-Cov-2 infection and should not be used as the sole basis for treatment or other patient management decisions. A negative result may occur with  improper specimen collection/handling, submission of specimen other than nasopharyngeal swab, presence of viral mutation(s) within the areas targeted by this assay, and inadequate number of viral copies(<138 copies/mL). A negative result must be combined with clinical observations, patient history, and epidemiological information. The expected result is Negative.  Fact Sheet for Patients:  BloggerCourse.com  Fact Sheet for Healthcare Providers:  SeriousBroker.it  This test is no t yet approved or cleared by the [place]  FDA and  has been authorized for detection  and/or diagnosis of SARS-CoV-2 by FDA under an Emergency Use Authorization (EUA). This EUA will remain  in effect (meaning this  test can be used) for the duration of the COVID-19 declaration under Section 564(b)(1) of the Act, 21 U.S.C.section 360bbb-3(b)(1), unless the authorization is terminated  or revoked sooner.       Influenza A by PCR NEGATIVE NEGATIVE Final   Influenza B by PCR NEGATIVE NEGATIVE Final    Comment: (NOTE) The Xpert Xpress SARS-CoV-2/FLU/RSV plus assay is intended as an aid in the diagnosis of influenza from Nasopharyngeal swab specimens and should not be used as a sole basis for treatment. Nasal washings and aspirates are unacceptable for Xpert Xpress SARS-CoV-2/FLU/RSV testing.  Fact Sheet for Patients: BloggerCourse.com  Fact Sheet for Healthcare Providers: SeriousBroker.it  This test is not yet approved or cleared by the [place]  FDA and has been authorized for detection and/or diagnosis of SARS-CoV-2 by FDA under an Emergency Use Authorization (EUA). This EUA will remain in effect (meaning this test can be used) for the duration of the COVID-19 declaration under Section 564(b)(1) of the Act, 21 U.S.C. section 360bbb-3(b)(1), unless the authorization is terminated or revoked.     Resp Syncytial Virus by PCR NEGATIVE NEGATIVE Final    Comment: (NOTE) Fact Sheet for Patients: BloggerCourse.com  Fact Sheet for Healthcare Providers: SeriousBroker.it  This test is not yet approved or cleared by the [place]  FDA and has been authorized for detection and/or diagnosis of SARS-CoV-2 by FDA under an Emergency Use Authorization (EUA). This EUA will remain in effect (meaning this test can be used) for the duration of the COVID-19 declaration under Section 564(b)(1) of the Act, 21 U.S.C. section 360bbb-3(b)(1), unless the authorization is terminated  or revoked.  Performed at [facility], [address], [place], [place] [address]   Blood Culture (routine x 2)     Status: Abnormal   Collection Time: 06/09/23  9:00 PM   Specimen: Right Antecubital; Blood  Result Value Ref Range Status   Specimen Description   Final    RIGHT ANTECUBITAL Performed at [facility], [address] [place], [place] [address]    Special Requests   Final    BOTTLES DRAWN AEROBIC AND ANAEROBIC Blood Culture adequate volume Performed at [facility], [address] [place], [place] [address]    Culture  Setup Time   Final    GRAM NEGATIVE RODS AEROBIC BOTTLE ONLY Gram Stain Report Called to,Read Back By and Verified With: BRITTANY B. ON 06/10/2023 @13 :02 BY T. HAMER  CRITICAL RESULT CALLED TO, READ BACK BY AND VERIFIED WITH: L EVANS RN 06/10/2023 @ 2302 BY AB Performed at [facility], [address] [place], [place] [address]    Culture ESCHERICHIA COLI (A)  Final   Report Status 06/12/2023 FINAL  Final   Organism ID, Bacteria ESCHERICHIA COLI  Final   Organism ID, Bacteria ESCHERICHIA COLI  Final      Susceptibility   Escherichia coli - KIRBY BAUER*    CEFAZOLIN SENSITIVE Sensitive    Escherichia coli - MIC*    AMPICILLIN <=2 SENSITIVE Sensitive     CEFEPIME <=0.12 SENSITIVE Sensitive     CEFTAZIDIME <=1 SENSITIVE Sensitive     CEFTRIAXONE  <=0.25 SENSITIVE Sensitive     CIPROFLOXACIN <=0.25 SENSITIVE Sensitive     GENTAMICIN <=1 SENSITIVE Sensitive     IMIPENEM <=0.25 SENSITIVE Sensitive     TRIMETH/SULFA <=20 SENSITIVE Sensitive     AMPICILLIN/SULBACTAM <=2 SENSITIVE Sensitive     PIP/TAZO <=4 SENSITIVE Sensitive ug/mL    * ESCHERICHIA COLI    ESCHERICHIA COLI  Blood Culture  ID Panel (Reflexed)     Status: Abnormal   Collection Time: 06/09/23  9:00 PM  Result Value Ref Range Status   Enterococcus faecalis NOT DETECTED NOT DETECTED Final   Enterococcus Faecium NOT DETECTED NOT DETECTED Final   Listeria monocytogenes NOT DETECTED NOT  DETECTED Final   Staphylococcus species NOT DETECTED NOT DETECTED Final   Staphylococcus aureus (BCID) NOT DETECTED NOT DETECTED Final   Staphylococcus epidermidis NOT DETECTED NOT DETECTED Final   Staphylococcus lugdunensis NOT DETECTED NOT DETECTED Final   Streptococcus species NOT DETECTED NOT DETECTED Final   Streptococcus agalactiae NOT DETECTED NOT DETECTED Final   Streptococcus pneumoniae NOT DETECTED NOT DETECTED Final   Streptococcus pyogenes NOT DETECTED NOT DETECTED Final   A.calcoaceticus-baumannii NOT DETECTED NOT DETECTED Final   Bacteroides fragilis NOT DETECTED NOT DETECTED Final   Enterobacterales DETECTED (A) NOT DETECTED Final    Comment: Enterobacterales represent a large order of gram negative bacteria, not a single organism. CRITICAL RESULT CALLED TO, READ BACK BY AND VERIFIED WITH: L EVANS RN 06/10/2023 @ 2302 BY AB    Enterobacter cloacae complex NOT DETECTED NOT DETECTED Final   Escherichia coli DETECTED (A) NOT DETECTED Final    Comment: CRITICAL RESULT CALLED TO, READ BACK BY AND VERIFIED WITH: L EVANS RN 06/10/2023 @ 2302 BY AB    Klebsiella aerogenes NOT DETECTED NOT DETECTED Final   Klebsiella oxytoca NOT DETECTED NOT DETECTED Final   Klebsiella pneumoniae NOT DETECTED NOT DETECTED Final   Proteus species NOT DETECTED NOT DETECTED Final   Salmonella species NOT DETECTED NOT DETECTED Final   Serratia marcescens NOT DETECTED NOT DETECTED Final   Haemophilus influenzae NOT DETECTED NOT DETECTED Final   Neisseria meningitidis NOT DETECTED NOT DETECTED Final   Pseudomonas aeruginosa NOT DETECTED NOT DETECTED Final   Stenotrophomonas maltophilia NOT DETECTED NOT DETECTED Final   Candida albicans NOT DETECTED NOT DETECTED Final   Candida auris NOT DETECTED NOT DETECTED Final   Candida glabrata NOT DETECTED NOT DETECTED Final   Candida krusei NOT DETECTED NOT DETECTED Final   Candida parapsilosis NOT DETECTED NOT DETECTED Final   Candida tropicalis NOT DETECTED  NOT DETECTED Final   Cryptococcus neoformans/gattii NOT DETECTED NOT DETECTED Final   CTX-M ESBL NOT DETECTED NOT DETECTED Final   Carbapenem resistance IMP NOT DETECTED NOT DETECTED Final   Carbapenem resistance KPC NOT DETECTED NOT DETECTED Final   Carbapenem resistance NDM NOT DETECTED NOT DETECTED Final   Carbapenem resist OXA 48 LIKE NOT DETECTED NOT DETECTED Final   Carbapenem resistance VIM NOT DETECTED NOT DETECTED Final    Comment: Performed at [facility], [address] [place], [place] [address]  Blood Culture (routine x 2)     Status: None (Preliminary result)   Collection Time: 06/09/23  9:24 PM   Specimen: BLOOD RIGHT HAND  Result Value Ref Range Status   Specimen Description BLOOD RIGHT HAND  Final   Special Requests   Final    BOTTLES DRAWN AEROBIC AND ANAEROBIC Blood Culture adequate volume   Culture  Setup Time   Final    GRAM NEGATIVE RODS ANAEROBIC BOTTLE ONLY Gram Stain Report Called to,Read Back By and Verified With: Coralee Derby, RN AT 2116 06/11/23 BY Yaakov Heir Performed at [facility], [address], [place], [place] [address]    Culture GRAM NEGATIVE RODS  Final   Report Status PENDING  Incomplete  MRSA Next Gen by PCR, Nasal     Status: None  Collection Time: 06/10/23  1:01 AM   Specimen: Nasal Mucosa; Nasal Swab  Result Value Ref Range Status   MRSA by PCR Next Gen NOT DETECTED NOT DETECTED Final    Comment: (NOTE) The GeneXpert MRSA Assay (FDA approved for NASAL specimens only), is one component of a comprehensive MRSA colonization surveillance program. It is not intended to diagnose MRSA infection nor to guide or monitor treatment for MRSA infections. Test performance is not FDA approved in patients less than 53 years old. Performed at [facility], [address], [place], [place] [address]    Today   Subjective    Roldan Laforest today has no new complaints  - Ambulated to bathroom with assist - Oral intake is fair - Had BM    Patient has been seen and examined prior to discharge   Objective   Blood pressure (!) 135/92, pulse 81, temperature 98.1 F (36.7 C), temperature source Oral, resp. rate (!) 24, height 6\' 1"  (1.854 m), weight 80 kg, SpO2 94%.   Intake/Output Summary (Last 24 hours) at 06/12/2023 1322 Last data filed at 06/12/2023 0830 Gross per 24 hour  Intake 580 ml  Output --  Net 580 ml    Exam Gen:- Awake Alert, no acute distress  HEENT:- [place].AT, No sclera icterus- -Ears--significant hearing loss  Neck-Supple Neck,No JVD,.  Lungs-  CTAB , good air movement bilaterally CV- S1, S2 normal, regular Abd-  +ve B.Sounds, Abd Soft, No tenderness, no CVA tenderness, no further significant abdominal distention Extremity/Skin:- No  edema,   good pulses Psych-affect is appropriate, oriented x3, forgetful with some cognitive issues from time to time Neuro-generalized weakness, no new focal deficits, no tremors    Data Review   CBC w Diff:  Lab Results  Component Value Date   WBC 8.2 06/12/2023   HGB 12.8 (L) 06/12/2023   HCT 39.2 06/12/2023   PLT 176 06/12/2023   LYMPHOPCT 3 06/09/2023   MONOPCT 5 06/09/2023   EOSPCT 1 06/09/2023   BASOPCT 0 06/09/2023    CMP:  Lab Results  Component Value Date   NA 136 06/12/2023   K 4.0 06/12/2023   CL 111 06/12/2023   CO2 22 06/12/2023   BUN 20 06/12/2023   CREATININE 1.62 (H) 06/12/2023   PROT 5.9 (L) 06/12/2023   ALBUMIN 2.7 (L) 06/12/2023   ALBUMIN 2.7 (L) 06/12/2023   BILITOT 0.9 06/12/2023   ALKPHOS 118 06/12/2023   AST 29 06/12/2023   ALT 67 (H) 06/12/2023  .  Total Discharge time is about 33 minutes  Colin Dawley M.D on 06/12/2023 at 1:22 PM  Go to [email or web address] -  for contact info  [facility] - Office  [phone]

## 2023-06-13 NOTE — Transitions of Care (Post Inpatient/ED Visit) (Signed)
 06/13/2023  Patient ID: Jerry Rodriguez, male   DOB: 1940-05-19, 83 y.o.   MRN: [id number]  Chart Review for transitions of care.  See [employer] for documentation.  Merideth Bosque J. Zamia Tyminski RN, MSN [facility], [facility] RN Care Manager Direct Dial: [phone]  Fax: [phone] Website: [email or web address]

## 2023-06-14 LAB — CULTURE, BLOOD (ROUTINE X 2): Special Requests: ADEQUATE

## 2023-06-14 NOTE — Transitions of Care (Post Inpatient/ED Visit) (Signed)
 06/14/2023  Patient ID: Jerry Rodriguez, male   DOB: 03-Nov-1940, 83 y.o.   MRN: [id number]  Medication Review for transitions of care.  See [employer] for  documentation.  Burman Bruington J. Emrys Mceachron RN, MSN [facility], [facility] RN Care Manager Direct Dial: [phone]  Fax: [phone] Website: [email or web address]

## 2023-06-19 DIAGNOSIS — A4151 Sepsis due to Escherichia coli [E. coli]: Secondary | ICD-10-CM | POA: Diagnosis not present

## 2023-06-19 DIAGNOSIS — R911 Solitary pulmonary nodule: Secondary | ICD-10-CM | POA: Diagnosis not present

## 2023-06-19 DIAGNOSIS — N170 Acute kidney failure with tubular necrosis: Secondary | ICD-10-CM | POA: Diagnosis not present

## 2023-06-22 DIAGNOSIS — B356 Tinea cruris: Secondary | ICD-10-CM | POA: Diagnosis not present

## 2023-07-16 ENCOUNTER — Encounter (HOSPITAL_COMMUNITY): Payer: Self-pay

## 2023-07-16 ENCOUNTER — Ambulatory Visit (HOSPITAL_COMMUNITY): Attending: Family Medicine

## 2023-07-16 DIAGNOSIS — R6510 Systemic inflammatory response syndrome (SIRS) of non-infectious origin without acute organ dysfunction: Secondary | ICD-10-CM | POA: Insufficient documentation

## 2023-07-16 DIAGNOSIS — Z7409 Other reduced mobility: Secondary | ICD-10-CM | POA: Insufficient documentation

## 2023-07-16 DIAGNOSIS — M6281 Muscle weakness (generalized): Secondary | ICD-10-CM | POA: Insufficient documentation

## 2023-07-16 NOTE — Therapy (Signed)
 [facility] at [facility] [address] [place], [place], [address] Phone: [phone]   Fax:  [phone]  Patient Details  Name: Jerry Rodriguez MRN: [id number] Date of Birth: 26-Mar-1940 Referring Provider:  No ref. provider found  Encounter Date: 07/16/2023   Called regarding no show to evaluation. Pt's wife was transferred to front desk to get rescheduled.    Omega JONETTA Bottcher, PT 07/16/2023, 12:21 PM  [facility], [facility], [facility] at [facility] [address] [place], [place], [address] Phone: [phone]   Fax:  [phone]

## 2023-07-16 NOTE — Therapy (Incomplete)
 OUTPATIENT PHYSICAL THERAPY NEURO EVALUATION   Patient Name: Jerry Rodriguez MRN: [id number] DOB:Sep 19, 1940, 83 y.o., male Today's Date: 07/16/2023   PCP: Sheryle Carwin MD REFERRING PROVIDER: Pearlean Manus, MD   END OF SESSION:   Past Medical History:  Diagnosis Date   Carotid artery occlusion    COPD (chronic obstructive pulmonary disease) (HCC)    Hypertension    Peripheral vascular disease (HCC)    Stroke (HCC)    No past surgical history on file. Patient Active Problem List   Diagnosis Date Noted   SIRS (systemic inflammatory response syndrome) (HCC) 06/10/2023   Lactic acidosis 06/10/2023   Transaminitis 06/10/2023   Hypophosphatemia 06/10/2023   History of CVA (cerebrovascular accident) 06/10/2023   Generalized weakness 03/18/2021   Diarrhea 03/18/2021   Elevated MCV 03/18/2021   Acute kidney injury superimposed on stage 4 chronic kidney disease (HCC) 03/18/2021   Essential hypertension 03/18/2021   Mixed hyperlipidemia 03/18/2021   Tobacco abuse 03/18/2021   Noncompliance with medication regimen 03/18/2021   Cervical stenosis of spinal canal 03/17/2021   HEMOCCULT POSITIVE STOOL 04/20/2008    ONSET DATE: ***  REFERRING DIAG: R65.10 (ICD-10-CM) - SIRS (systemic inflammatory response syndrome) (HCC)  THERAPY DIAG:  No diagnosis found.  Rationale for Evaluation and Treatment: Rehabilitation  SUBJECTIVE:                                                                                                                                                                                             SUBJECTIVE STATEMENT: *** Pt accompanied by: {accompnied:27141}  PERTINENT HISTORY: ***  PAIN:  Are you having pain? {OPRCPAIN:27236}  PRECAUTIONS: {Therapy precautions:24002}  RED FLAGS: {PT Red Flags:29287}   WEIGHT BEARING RESTRICTIONS: {Yes ***/No:24003}  FALLS: Has patient fallen in last 6 months? {fallsyesno:27318}  PATIENT GOALS: ***  OBJECTIVE:   Note: Objective measures were completed at Evaluation unless otherwise noted.  DIAGNOSTIC FINDINGS: ***  COGNITION: Overall cognitive status: {cognition:24006}   SENSATION: {sensation:27233}  COORDINATION: ***  POSTURE: {posture:25561}  LOWER EXTREMITY ROM:     Active  Right Eval Left Eval  Hip flexion    Hip extension    Hip abduction    Hip adduction    Hip internal rotation    Hip external rotation    Knee flexion    Knee extension    Ankle dorsiflexion    Ankle plantarflexion    Ankle inversion    Ankle eversion     (Blank rows = not tested)  LOWER EXTREMITY MMT:    MMT Right Eval Left Eval  Hip flexion    Hip extension    Hip  abduction    Hip adduction    Hip internal rotation    Hip external rotation    Knee flexion    Knee extension    Ankle dorsiflexion    Ankle plantarflexion    Ankle inversion    Ankle eversion    (Blank rows = not tested)   STAIRS: {stairs eval:32618} GAIT: Findings: {GaitneuroPT:32644::Distance walked: ***,Comments: ***}  FUNCTIONAL TESTS:  {Functional tests:24029}  PATIENT SURVEYS:  ABC Scale: ***                                                                                                                              TREATMENT DATE:   07/16/2023 PT Evaluation    PATIENT EDUCATION: Education details: PT Evaluation, findings, prognosis, frequency, attendance policy, and ***. Person educated: Patient Education method: Medical illustrator Education comprehension: verbalized understanding  HOME EXERCISE PROGRAM: ***  GOALS: Goals reviewed with patient? {yes/no:20286}  SHORT TERM GOALS: Target date: ***  Pt will be independent with HEP in order to demonstrate participation in Physical Therapy POC.  Baseline: Goal status: INITIAL  2.  Pt will improve ABC score by *** in order to demonstrate improved pain with functional goals and outcomes. Baseline:  Goal status: INITIAL  LONG TERM  GOALS: Target date: ***  Pt will increase *** score by > *** in order to demonstrate improved functional safety and balance skills in ADL/mobility.   Baseline: see objective.  Goal status: INITIAL  2.  Pt will improve 2 MWT/30 Second Chair Stand Test by *** in order to demonstrate improved functional mobility capacity in community setting.  Baseline: see objective.  Goal status: INITIAL  3.  Pt will improve ABC score by *** in order to demonstrate improved pain with functional goals and outcomes. Baseline: see objective.  Goal status: INITIAL  4.  Pt will *** in order to demonstrate improved capacity, safety, and overall quality of movement to optimize function.  Baseline: see objective.  Goal status: INITIAL  ASSESSMENT:  CLINICAL IMPRESSION: Patient is a *** y.o. *** who was seen today for physical therapy evaluation and treatment for R65.10 (ICD-10-CM) - SIRS (systemic inflammatory response syndrome) (HCC). ***. Pt will benefit from skilled Physical Therapy services to address deficits/limitations in order to improve functional and QOL.    OBJECTIVE IMPAIRMENTS: {opptimpairments:25111}.   ACTIVITY LIMITATIONS: {activitylimitations:27494}  PARTICIPATION LIMITATIONS: {participationrestrictions:25113}  PERSONAL FACTORS: {Personal factors:25162} are also affecting patient's functional outcome.   REHAB POTENTIAL: {rehabpotential:25112}  CLINICAL DECISION MAKING: {clinical decision making:25114}  EVALUATION COMPLEXITY: {Evaluation complexity:25115}  PLAN:  PT FREQUENCY: {rehab frequency:25116}  PT DURATION: {rehab duration:25117}  PLANNED INTERVENTIONS: {rehab planned interventions:25118::97110-Therapeutic exercises,97530- Therapeutic [phone]- Neuromuscular re-education,97535- Self Rjmz,02859- Manual therapy}  PLAN FOR NEXT SESSION: ***   Omega JONETTA Bottcher, PT 07/16/2023, 8:19 AM

## 2023-07-23 ENCOUNTER — Ambulatory Visit (HOSPITAL_COMMUNITY)

## 2023-07-23 ENCOUNTER — Other Ambulatory Visit: Payer: Self-pay

## 2023-07-23 ENCOUNTER — Encounter (HOSPITAL_COMMUNITY): Payer: Self-pay

## 2023-07-23 DIAGNOSIS — Z7409 Other reduced mobility: Secondary | ICD-10-CM

## 2023-07-23 DIAGNOSIS — R6510 Systemic inflammatory response syndrome (SIRS) of non-infectious origin without acute organ dysfunction: Secondary | ICD-10-CM | POA: Diagnosis present

## 2023-07-23 DIAGNOSIS — M6281 Muscle weakness (generalized): Secondary | ICD-10-CM | POA: Diagnosis present

## 2023-07-23 NOTE — Therapy (Signed)
 OUTPATIENT PHYSICAL THERAPY LOWER EXTREMITY EVALUATION   Patient Name: Jerry Rodriguez MRN: [id number] DOB:09-May-1940, 83 y.o., male Today's Date: 07/23/2023  END OF SESSION:  PT End of Session - 07/23/23 1357     Visit Number 1    Number of Visits 12    Date for PT Re-Evaluation 08/20/23    Authorization Type Medicare part A and B    Authorization Time Period no auth    Progress Note Due on Visit 10    PT Start Time 1358    PT Stop Time 1430    PT Time Calculation (min) 32 min    Activity Tolerance Patient tolerated treatment well    Behavior During Therapy WFL for tasks assessed/performed          Past Medical History:  Diagnosis Date   Carotid artery occlusion    COPD (chronic obstructive pulmonary disease) (HCC)    Hypertension    Peripheral vascular disease (HCC)    Stroke (HCC)    History reviewed. No pertinent surgical history. Patient Active Problem List   Diagnosis Date Noted   SIRS (systemic inflammatory response syndrome) (HCC) 06/10/2023   Lactic acidosis 06/10/2023   Transaminitis 06/10/2023   Hypophosphatemia 06/10/2023   History of CVA (cerebrovascular accident) 06/10/2023   Generalized weakness 03/18/2021   Diarrhea 03/18/2021   Elevated MCV 03/18/2021   Acute kidney injury superimposed on stage 4 chronic kidney disease (HCC) 03/18/2021   Essential hypertension 03/18/2021   Mixed hyperlipidemia 03/18/2021   Tobacco abuse 03/18/2021   Noncompliance with medication regimen 03/18/2021   Cervical stenosis of spinal canal 03/17/2021   HEMOCCULT POSITIVE STOOL 04/20/2008    PCP: Sheryle Carwin, MD  REFERRING PROVIDER: Pearlean Manus, MD  REFERRING DIAG: R65.10 (ICD-10-CM) - SIRS (systemic inflammatory response syndrome) (HCC)  THERAPY DIAG:  Impaired functional mobility, balance, gait, and endurance  Muscle weakness (generalized)  Rationale for Evaluation and Treatment: Rehabilitation  ONSET DATE: 3 years  SUBJECTIVE:   SUBJECTIVE  STATEMENT: Session limited due to late arrival. Wife completes subjective this date, reporting that patient cannot hear and refuses to use hearing aids. Wife reports patient has needed increasing help over the past few years. Reports patient needs help with getting up, bathing/dressing, iADLs. Reports it all started when patient had cataracts surgery. Now he does less and she helps him more and more. Reports MD says because patient was a smoker since 83 years old and continues to smoke, he now has plaque built up around body. Reports patient is eating less now.   PERTINENT HISTORY: COPD CVA PAIN:  Are you having pain? No  PRECAUTIONS: None  RED FLAGS: None   WEIGHT BEARING RESTRICTIONS: No  FALLS:  Has patient fallen in last 6 months? No  LIVING ENVIRONMENT: Lives with: lives with their spouse Lives in: House/apartment Has following equipment at home: Single point cane  OCCUPATION: N/A  PLOF: Needs assistance with ADLs, Needs assistance with homemaking, and Needs assistance with gait  PATIENT GOALS: Wife: Mainly to show him that he can do more than he is doing  NEXT MD VISIT: Unsure   OBJECTIVE:  Note: Objective measures were completed at Evaluation unless otherwise noted.  DIAGNOSTIC FINDINGS:   PATIENT SURVEYS:  Unable to assess  COGNITION: Overall cognitive status: Difficulty to assess due to: Communication impairment      POSTURE: rounded shoulders and forward head  PALPATION:   LOWER EXTREMITY ROM:  Active ROM Right eval Left eval  Hip flexion    Hip  extension    Hip abduction    Hip adduction    Hip internal rotation    Hip external rotation    Knee flexion    Knee extension    Ankle dorsiflexion    Ankle plantarflexion    Ankle inversion    Ankle eversion     (Blank rows = not tested)  LOWER EXTREMITY MMT:  MMT Right eval Left eval  Hip flexion 4 4  Hip extension    Hip abduction    Hip adduction    Hip internal rotation    Hip  external rotation    Knee flexion    Knee extension    Ankle dorsiflexion 4+ 4+  Ankle plantarflexion    Ankle inversion    Ankle eversion     (Blank rows = not tested)  LOWER EXTREMITY SPECIAL TESTS:   FUNCTIONAL TESTS:  5 times sit to stand: 19 seconds, UE use 2 minute walk test: 205 ft, impaired balance throughout, moderate  SOB  GAIT: Distance walked: 205' Assistive device utilized: Single point cane Level of assistance: Modified independence Comments: Dec velocity, drifts/staggers R/L, with SPC, forward posture                                                                                                                                TREATMENT DATE:  07/23/23: PT EVAL and HEP     PATIENT EDUCATION:  Education details: PT evaluation, objective findings, POC, Importance of HEP, Precautions, Clinic policies  Person educated: Patient and Spouse Education method: Explanation, Demonstration, Tactile cues, Verbal cues, and Handouts Education comprehension: verbalized understanding, returned demonstration, and tactile cues required  HOME EXERCISE PROGRAM: Access Code: [email or web address] URL: [email or web address] Date: 07/23/2023 Prepared by: Rosaria Powell-Butler  Exercises - Seated Long Arc Quad  - 2 x daily - 7 x weekly - 3 sets - 10 reps - Seated March  - 2 x daily - 7 x weekly - 3 sets - 10 reps - Seated Heel Raise  - 2 x daily - 7 x weekly - 3 sets - 10 reps - Seated Heel Toe Raises  - 2 x daily - 7 x weekly - 3 sets - 10 reps - Sit to Stand  - 2 x daily - 7 x weekly - 3 sets - 10 reps  -Daily walking program  ASSESSMENT:  CLINICAL IMPRESSION: Patient is a 83 y.o. male who was seen today for physical therapy evaluation and treatment for R65.10 (ICD-10-CM) - SIRS (systemic inflammatory response syndrome) (HCC). Patient arrives with wife late to session which limits evaluation. Wife completes subjective history taking as patient cannot hear and does not wear  hearing aids. PT communicates instruction to patient through written notes. On this date, patient demonstrates generalized weakness throughout, impaired balance, and decreased endurance/activity tolerance, which is currently impairing his independence with ADL/iADLs. Patient will benefit from continued skilled physical therapy in order to address the above to improve function/QOL.  OBJECTIVE IMPAIRMENTS: Abnormal gait, cardiopulmonary status limiting activity, decreased activity tolerance, decreased balance, decreased coordination, decreased endurance, decreased mobility, difficulty walking, decreased strength, decreased safety awareness, improper body mechanics, and postural dysfunction.   ACTIVITY LIMITATIONS: carrying, lifting, bending, standing, squatting, stairs, transfers, bathing, toileting, and dressing  PARTICIPATION LIMITATIONS: meal prep, cleaning, laundry, driving, and community activity  PERSONAL FACTORS: Behavior pattern are also affecting patient's functional outcome.   REHAB POTENTIAL: Fair    CLINICAL DECISION MAKING: Evolving/moderate complexity  EVALUATION COMPLEXITY: Moderate   GOALS: Goals reviewed with patient? No  SHORT TERM GOALS: Target date: 08/06/23 Patient will be independent with performance of HEP to demonstrate adequate self management of symptoms.  Baseline:  Goal status: INITIAL  2.   Patient will report at least a 25% improvement with function or pain overall since beginning PT. Baseline:  Goal status: INITIAL  LONG TERM GOALS: Target date: 09/03/23  Patient will improve hip flexion MMT by at least 1/2 a grade to demonstrate improve LE strength needed for functional transfers.  Baseline:  Goal status: INITIAL  2.  Patient will improve 5 times sit to stand test to 15 seconds or less to demonstrate improved LE strength/power needed for community ambulation.  Baseline:  Goal status: INITIAL  3.  Patient will improve 2 minute walk test by 40 feet  in order to demonstrate improved LE endurance needed for community ambulation.  Baseline:  Goal status: INITIAL    PLAN:  PT FREQUENCY: 2x/week  PT DURATION: 6 weeks  PLANNED INTERVENTIONS: 97164- PT Re-evaluation, 97110-Therapeutic exercises, 97530- Therapeutic activity, [id number]- Neuromuscular re-education, 97535- Self Care, 02859- Manual therapy, [id number]- Gait training, [phone]- Electrical stimulation (manual), [id number]- Traction (mechanical), [phone] (1-2 muscles), 20561 (3+ muscles)- Dry Needling, Patient/Family education, Balance training, Stair training, Taping, Joint mobilization, Spinal mobilization, DME instructions, Cryotherapy, and Moist heat  PLAN FOR NEXT SESSION: Review HEP and goals, TUG, Berg, progress LE strength and overall endurance   6:41 PM, 07/23/23 Marne Meline Powell-Butler, PT, DPT [facility] [place]

## 2023-09-18 ENCOUNTER — Other Ambulatory Visit: Payer: Self-pay | Admitting: *Deleted

## 2023-09-18 NOTE — Patient Outreach (Signed)
 Mr. Jerry Rodriguez qualifies to utilize [facility] SNF waiver. FL2 completed and PASRR number received [id number].  Mr. Jerry Rodriguez will admit to [facility] from home. Kirke, SW [facility] will coordinate admission details with family.  Writer will follow once Mr. Jerry Rodriguez admits to [facility].  Pablo Hurst, MSN, RN, BSN [facility], [facility] RN Post- Acute Care Manager Direct Dial: [phone]

## 2023-09-19 DIAGNOSIS — M6281 Muscle weakness (generalized): Secondary | ICD-10-CM | POA: Diagnosis not present

## 2023-09-19 DIAGNOSIS — S72002A Fracture of unspecified part of neck of left femur, initial encounter for closed fracture: Secondary | ICD-10-CM | POA: Diagnosis not present

## 2023-09-19 DIAGNOSIS — N1832 Chronic kidney disease, stage 3b: Secondary | ICD-10-CM | POA: Diagnosis not present

## 2023-09-19 DIAGNOSIS — I739 Peripheral vascular disease, unspecified: Secondary | ICD-10-CM | POA: Diagnosis not present

## 2023-09-19 DIAGNOSIS — R531 Weakness: Secondary | ICD-10-CM | POA: Diagnosis not present

## 2023-09-19 DIAGNOSIS — M542 Cervicalgia: Secondary | ICD-10-CM | POA: Diagnosis not present

## 2023-09-19 DIAGNOSIS — F0390 Unspecified dementia without behavioral disturbance: Secondary | ICD-10-CM | POA: Diagnosis not present

## 2023-09-19 DIAGNOSIS — N183 Chronic kidney disease, stage 3 unspecified: Secondary | ICD-10-CM | POA: Diagnosis not present

## 2023-09-19 DIAGNOSIS — M4182 Other forms of scoliosis, cervical region: Secondary | ICD-10-CM | POA: Diagnosis not present

## 2023-09-19 DIAGNOSIS — E782 Mixed hyperlipidemia: Secondary | ICD-10-CM | POA: Diagnosis not present

## 2023-09-19 DIAGNOSIS — E440 Moderate protein-calorie malnutrition: Secondary | ICD-10-CM | POA: Diagnosis not present

## 2023-09-19 DIAGNOSIS — M25552 Pain in left hip: Secondary | ICD-10-CM | POA: Diagnosis not present

## 2023-09-19 DIAGNOSIS — F17210 Nicotine dependence, cigarettes, uncomplicated: Secondary | ICD-10-CM | POA: Diagnosis not present

## 2023-09-19 DIAGNOSIS — R519 Headache, unspecified: Secondary | ICD-10-CM | POA: Diagnosis not present

## 2023-09-19 DIAGNOSIS — I129 Hypertensive chronic kidney disease with stage 1 through stage 4 chronic kidney disease, or unspecified chronic kidney disease: Secondary | ICD-10-CM | POA: Diagnosis not present

## 2023-09-19 DIAGNOSIS — R4701 Aphasia: Secondary | ICD-10-CM | POA: Diagnosis not present

## 2023-09-19 DIAGNOSIS — N1831 Chronic kidney disease, stage 3a: Secondary | ICD-10-CM | POA: Diagnosis not present

## 2023-09-19 DIAGNOSIS — N184 Chronic kidney disease, stage 4 (severe): Secondary | ICD-10-CM | POA: Diagnosis not present

## 2023-09-19 DIAGNOSIS — M79642 Pain in left hand: Secondary | ICD-10-CM | POA: Diagnosis not present

## 2023-09-19 DIAGNOSIS — R627 Adult failure to thrive: Secondary | ICD-10-CM | POA: Diagnosis not present

## 2023-09-19 DIAGNOSIS — R279 Unspecified lack of coordination: Secondary | ICD-10-CM | POA: Diagnosis not present

## 2023-09-19 DIAGNOSIS — R718 Other abnormality of red blood cells: Secondary | ICD-10-CM | POA: Diagnosis not present

## 2023-09-19 DIAGNOSIS — Z79899 Other long term (current) drug therapy: Secondary | ICD-10-CM | POA: Diagnosis not present

## 2023-09-19 DIAGNOSIS — R9431 Abnormal electrocardiogram [ECG] [EKG]: Secondary | ICD-10-CM | POA: Diagnosis not present

## 2023-09-19 DIAGNOSIS — M160 Bilateral primary osteoarthritis of hip: Secondary | ICD-10-CM | POA: Diagnosis not present

## 2023-09-19 DIAGNOSIS — I2510 Atherosclerotic heart disease of native coronary artery without angina pectoris: Secondary | ICD-10-CM | POA: Diagnosis not present

## 2023-09-19 DIAGNOSIS — M4801 Spinal stenosis, occipito-atlanto-axial region: Secondary | ICD-10-CM | POA: Diagnosis not present

## 2023-09-19 DIAGNOSIS — Z751 Person awaiting admission to adequate facility elsewhere: Secondary | ICD-10-CM | POA: Diagnosis not present

## 2023-09-19 DIAGNOSIS — M4802 Spinal stenosis, cervical region: Secondary | ICD-10-CM | POA: Diagnosis not present

## 2023-09-19 DIAGNOSIS — R262 Difficulty in walking, not elsewhere classified: Secondary | ICD-10-CM | POA: Diagnosis not present

## 2023-09-19 DIAGNOSIS — S0191XA Laceration without foreign body of unspecified part of head, initial encounter: Secondary | ICD-10-CM | POA: Diagnosis not present

## 2023-09-19 DIAGNOSIS — Z8673 Personal history of transient ischemic attack (TIA), and cerebral infarction without residual deficits: Secondary | ICD-10-CM | POA: Diagnosis not present

## 2023-09-19 DIAGNOSIS — T148XXA Other injury of unspecified body region, initial encounter: Secondary | ICD-10-CM | POA: Diagnosis not present

## 2023-09-19 DIAGNOSIS — J449 Chronic obstructive pulmonary disease, unspecified: Secondary | ICD-10-CM | POA: Diagnosis not present

## 2023-09-19 DIAGNOSIS — E559 Vitamin D deficiency, unspecified: Secondary | ICD-10-CM | POA: Diagnosis not present

## 2023-09-19 DIAGNOSIS — E785 Hyperlipidemia, unspecified: Secondary | ICD-10-CM | POA: Diagnosis not present

## 2023-09-19 DIAGNOSIS — F01518 Vascular dementia, unspecified severity, with other behavioral disturbance: Secondary | ICD-10-CM | POA: Diagnosis not present

## 2023-09-19 DIAGNOSIS — I10 Essential (primary) hypertension: Secondary | ICD-10-CM | POA: Diagnosis not present

## 2023-09-19 DIAGNOSIS — S72012A Unspecified intracapsular fracture of left femur, initial encounter for closed fracture: Secondary | ICD-10-CM | POA: Diagnosis not present

## 2023-09-19 DIAGNOSIS — I639 Cerebral infarction, unspecified: Secondary | ICD-10-CM | POA: Diagnosis not present

## 2023-09-19 DIAGNOSIS — Z66 Do not resuscitate: Secondary | ICD-10-CM | POA: Diagnosis not present

## 2023-09-19 DIAGNOSIS — Z043 Encounter for examination and observation following other accident: Secondary | ICD-10-CM | POA: Diagnosis not present

## 2023-09-19 DIAGNOSIS — Z720 Tobacco use: Secondary | ICD-10-CM | POA: Diagnosis not present

## 2023-09-19 DIAGNOSIS — H9190 Unspecified hearing loss, unspecified ear: Secondary | ICD-10-CM | POA: Diagnosis not present

## 2023-09-19 DIAGNOSIS — I700 Atherosclerosis of aorta: Secondary | ICD-10-CM | POA: Diagnosis not present

## 2023-09-19 DIAGNOSIS — H9193 Unspecified hearing loss, bilateral: Secondary | ICD-10-CM | POA: Diagnosis not present

## 2023-09-19 DIAGNOSIS — M25522 Pain in left elbow: Secondary | ICD-10-CM | POA: Diagnosis not present

## 2023-09-19 DIAGNOSIS — Y92129 Unspecified place in nursing home as the place of occurrence of the external cause: Secondary | ICD-10-CM | POA: Diagnosis not present

## 2023-09-19 DIAGNOSIS — R4189 Other symptoms and signs involving cognitive functions and awareness: Secondary | ICD-10-CM | POA: Diagnosis not present

## 2023-09-19 DIAGNOSIS — W19XXXA Unspecified fall, initial encounter: Secondary | ICD-10-CM | POA: Diagnosis not present

## 2023-09-19 DIAGNOSIS — Z7982 Long term (current) use of aspirin: Secondary | ICD-10-CM | POA: Diagnosis not present

## 2023-09-19 DIAGNOSIS — R1312 Dysphagia, oropharyngeal phase: Secondary | ICD-10-CM | POA: Diagnosis not present

## 2023-09-19 DIAGNOSIS — N179 Acute kidney failure, unspecified: Secondary | ICD-10-CM | POA: Diagnosis not present

## 2023-09-19 DIAGNOSIS — R41841 Cognitive communication deficit: Secondary | ICD-10-CM | POA: Diagnosis not present

## 2023-09-19 DIAGNOSIS — Z6822 Body mass index (BMI) 22.0-22.9, adult: Secondary | ICD-10-CM | POA: Diagnosis not present

## 2023-09-20 ENCOUNTER — Encounter: Payer: Self-pay | Admitting: Adult Health

## 2023-09-20 ENCOUNTER — Other Ambulatory Visit (HOSPITAL_COMMUNITY)
Admission: RE | Admit: 2023-09-20 | Discharge: 2023-09-20 | Disposition: A | Discharge status: Home / Self Care | Source: Skilled Nursing Facility | Attending: Adult Health | Admitting: Adult Health

## 2023-09-20 ENCOUNTER — Non-Acute Institutional Stay (SKILLED_NURSING_FACILITY): Payer: Self-pay | Admitting: Adult Health

## 2023-09-20 DIAGNOSIS — E440 Moderate protein-calorie malnutrition: Secondary | ICD-10-CM | POA: Insufficient documentation

## 2023-09-20 DIAGNOSIS — I129 Hypertensive chronic kidney disease with stage 1 through stage 4 chronic kidney disease, or unspecified chronic kidney disease: Secondary | ICD-10-CM | POA: Insufficient documentation

## 2023-09-20 DIAGNOSIS — I2510 Atherosclerotic heart disease of native coronary artery without angina pectoris: Secondary | ICD-10-CM | POA: Insufficient documentation

## 2023-09-20 DIAGNOSIS — N1831 Chronic kidney disease, stage 3a: Secondary | ICD-10-CM | POA: Insufficient documentation

## 2023-09-20 DIAGNOSIS — Z8673 Personal history of transient ischemic attack (TIA), and cerebral infarction without residual deficits: Secondary | ICD-10-CM | POA: Diagnosis not present

## 2023-09-20 DIAGNOSIS — E782 Mixed hyperlipidemia: Secondary | ICD-10-CM

## 2023-09-20 DIAGNOSIS — R4189 Other symptoms and signs involving cognitive functions and awareness: Secondary | ICD-10-CM | POA: Diagnosis not present

## 2023-09-20 DIAGNOSIS — N183 Chronic kidney disease, stage 3 unspecified: Secondary | ICD-10-CM

## 2023-09-20 LAB — CBC
HCT: 42.6 % (ref 39.0–52.0)
Hemoglobin: 14.4 g/dL (ref 13.0–17.0)
MCH: 34.1 pg — ABNORMAL HIGH (ref 26.0–34.0)
MCHC: 33.8 g/dL (ref 30.0–36.0)
MCV: 100.9 fL — ABNORMAL HIGH (ref 80.0–100.0)
Platelets: 258 K/uL (ref 150–400)
RBC: 4.22 MIL/uL (ref 4.22–5.81)
RDW: 12.8 % (ref 11.5–15.5)
WBC: 6.9 K/uL (ref 4.0–10.5)
nRBC: 0 % (ref 0.0–0.2)

## 2023-09-20 LAB — LIPID PANEL
Cholesterol: 168 mg/dL (ref 0–200)
HDL: 29 mg/dL — ABNORMAL LOW (ref >40)
LDL Cholesterol: 121 mg/dL — ABNORMAL HIGH (ref 0–99)
Total CHOL/HDL Ratio: 5.8 ratio
Triglycerides: 92 mg/dL (ref <150)
VLDL: 18 mg/dL (ref 0–40)

## 2023-09-20 LAB — COMPREHENSIVE METABOLIC PANEL WITH GFR
ALT: 28 U/L (ref 0–44)
AST: 25 U/L (ref 15–41)
Albumin: 2.8 g/dL — ABNORMAL LOW (ref 3.5–5.0)
Alkaline Phosphatase: 106 U/L (ref 38–126)
Anion gap: 12 (ref 5–15)
BUN: 25 mg/dL — ABNORMAL HIGH (ref 8–23)
CO2: 23 mmol/L (ref 22–32)
Calcium: 9.1 mg/dL (ref 8.9–10.3)
Chloride: 104 mmol/L (ref 98–111)
Creatinine, Ser: 1.65 mg/dL — ABNORMAL HIGH (ref 0.61–1.24)
GFR, Estimated: 41 mL/min — ABNORMAL LOW (ref >60)
Glucose, Bld: 105 mg/dL — ABNORMAL HIGH (ref 70–99)
Potassium: 3.7 mmol/L (ref 3.5–5.1)
Sodium: 139 mmol/L (ref 135–145)
Total Bilirubin: 0.8 mg/dL (ref 0.0–1.2)
Total Protein: 6.6 g/dL (ref 6.5–8.1)

## 2023-09-20 LAB — VITAMIN D 25 HYDROXY (VIT D DEFICIENCY, FRACTURES): Vit D, 25-Hydroxy: 41.17 ng/mL (ref 30–100)

## 2023-09-20 NOTE — Progress Notes (Signed)
 Location:  [facility] Room Number: 143 Place of Service:  SNF (31)   CODE STATUS: dnr   No Known Allergies  Chief Complaint  Patient presents with   Acute Visit    Follow up transfer     HPI:  He is being transferred to this facility from home for short term rehab. He requires assistance with his adl care. Today he is not voicing any complaints. He is not oriented to his surroundings. His goal is to return back home versus assisted living. He will continue to be followed for his chronic illnesses including:  Stage 3a chronic kidney disease:  Mixed hyperlipidemia:   History of CVA (cerebral vascular disease)   Past Medical History:  Diagnosis Date   Carotid artery occlusion    COPD (chronic obstructive pulmonary disease) (HCC)    Hypertension    Peripheral vascular disease (HCC)    Stroke (HCC)     No past surgical history on file.  Social History   Socioeconomic History   Marital status: Married    Spouse name: Not on file   Number of children: Not on file   Years of education: Not on file   Highest education level: Not on file  Occupational History   Not on file  Tobacco Use   Smoking status: Every Day    Current packs/day: 1.00    Average packs/day: 1 pack/day for 60.0 years (60.0 ttl pk-yrs)    Types: Cigarettes   Smokeless tobacco: Never  Vaping Use   Vaping status: Never Used  Substance and Sexual Activity   Alcohol use: No    Alcohol/week: 0.0 standard drinks of alcohol   Drug use: No   Sexual activity: Not on file  Other Topics Concern   Not on file  Social History Narrative   Not on file   Social Drivers of Health   Financial Resource Strain: Not on file  Food Insecurity: No Food Insecurity (06/10/2023)   Hunger Vital Sign    Worried About Running Out of Food in the Last Year: Never true    Ran Out of Food in the Last Year: Never true  Transportation Needs: No Transportation Needs (06/10/2023)   PRAPARE - Therapist, art (Medical): No    Lack of Transportation (Non-Medical): No  Physical Activity: Not on file  Stress: Not on file  Social Connections: Moderately Isolated (06/10/2023)   Social Connection and Isolation Panel    Frequency of Communication with Friends and Family: Once a week    Frequency of Social Gatherings with Friends and Family: Twice a week    Attends Religious Services: 1 to 4 times per year    Active Member of [employer] or Organizations: No    Attends Banker Meetings: Never    Marital Status: Widowed  Intimate Partner Violence: Not At Risk (06/10/2023)   Humiliation, Afraid, Rape, and Kick questionnaire    Fear of Current or Ex-Partner: No    Emotionally Abused: No    Physically Abused: No    Sexually Abused: No   No family history on file.    VITAL SIGNS BP 132/74   Pulse 86   Temp 97.8 F (36.6 C)   Resp 20   Ht 5' 9 (1.753 m)   Wt 153 lb (69.4 kg)   SpO2 98%   BMI 22.59 kg/m   Outpatient Encounter Medications as of 09/20/2023  Medication Sig   amLODipine  (NORVASC ) 5  MG tablet Take 1 tablet (5 mg total) by mouth daily.   aspirin  81 MG tablet Take 1 tablet (81 mg total) by mouth daily with breakfast.   atorvastatin  (LIPITOR) 20 MG tablet Take 20 mg by mouth daily.   cholecalciferol (VITAMIN D3) 25 MCG (1000 UNIT) tablet Take 2,000 Units by mouth daily.   nicotine  (NICODERM CQ  - DOSED IN MG/24 HOURS) 21 mg/24hr patch Place 1 patch (21 mg total) onto the skin daily.   ramipril  (ALTACE ) 5 MG capsule Take 1 capsule by mouth daily.   No facility-administered encounter medications on file as of 09/20/2023.     SIGNIFICANT DIAGNOSTIC EXAMS  LABS  09-20-23: wbc 6.9; hgb 14.4; hct 42.6; mcv 100.9; plt 258; glucose 105; bun 25; creat 1.65; k+ 3.7; an++ 139; ca 9.1; gfr 41; protein 6.6 albumin 2.8; chol 168; ldl 121; trig 92 hdl 29   Review of Systems  Constitutional:  Negative for malaise/fatigue.  Respiratory:  Negative for cough and  shortness of breath.   Cardiovascular:  Negative for chest pain, palpitations and leg swelling.  Gastrointestinal:  Negative for abdominal pain, constipation and heartburn.  Musculoskeletal:  Negative for back pain, joint pain and myalgias.  Skin: Negative.   Neurological:  Negative for dizziness.  Psychiatric/Behavioral:  The patient is not nervous/anxious.    Physical Exam Constitutional:      General: He is not in acute distress.    Appearance: He is well-developed. He is not diaphoretic.  Neck:     Thyroid: No thyromegaly.  Cardiovascular:     Rate and Rhythm: Normal rate and regular rhythm.     Pulses: Normal pulses.     Heart sounds: Murmur heard.  Pulmonary:     Effort: Pulmonary effort is normal. No respiratory distress.     Breath sounds: Normal breath sounds.  Abdominal:     General: Bowel sounds are normal. There is no distension.     Palpations: Abdomen is soft.     Tenderness: There is no abdominal tenderness.  Musculoskeletal:        General: Normal range of motion.     Cervical back: Neck supple.     Right lower leg: No edema.     Left lower leg: No edema.  Lymphadenopathy:     Cervical: No cervical adenopathy.  Skin:    General: Skin is warm and dry.  Neurological:     Mental Status: He is alert. Mental status is at baseline.  Psychiatric:        Mood and Affect: Mood normal.     ASSESSMENT/ PLAN:  TODAY  Benign hypertension with CKD (Chronic kidney disease) stage III: b/p 132/74: will continue altace  5 mg daily; norvasc  5 mg daily   2. Stage 3a chronic kidney disease: bun 25; creat 1.65 gfr 41  3. Mixed hyperlipidemia: ldl 121 will continue lipitor 20 mg daily   4. History of CVA (cerebral vascular disease) will continue asa 81 mg daily   5. Moderate cognitive impairment: did not know year or place; has white matter disease on ct scan  6. Moderate protein calorie malnutrition: albumin 2.8 will continue prostat twice daily   7. Aortic  atherosclerosis (ct 06-09-23) is on asa and statin  8. Smoker: is on nicoderm patch 21 mg daily     Jerry Seip NP [facility]   call [phone]

## 2023-09-21 ENCOUNTER — Emergency Department (HOSPITAL_COMMUNITY)

## 2023-09-21 ENCOUNTER — Other Ambulatory Visit: Payer: Self-pay

## 2023-09-21 ENCOUNTER — Non-Acute Institutional Stay (SKILLED_NURSING_FACILITY): Payer: Self-pay | Admitting: Internal Medicine

## 2023-09-21 ENCOUNTER — Encounter: Payer: Self-pay | Admitting: Internal Medicine

## 2023-09-21 ENCOUNTER — Inpatient Hospital Stay (HOSPITAL_COMMUNITY)
Admission: EM | Admit: 2023-09-21 | Discharge: 2023-09-25 | DRG: 522 | Disposition: A | Discharge status: Skilled Nursing Facility | Source: Skilled Nursing Facility | Attending: Internal Medicine | Admitting: Internal Medicine

## 2023-09-21 DIAGNOSIS — S72009A Fracture of unspecified part of neck of unspecified femur, initial encounter for closed fracture: Secondary | ICD-10-CM | POA: Diagnosis present

## 2023-09-21 DIAGNOSIS — H9193 Unspecified hearing loss, bilateral: Secondary | ICD-10-CM | POA: Diagnosis not present

## 2023-09-21 DIAGNOSIS — I700 Atherosclerosis of aorta: Secondary | ICD-10-CM | POA: Diagnosis present

## 2023-09-21 DIAGNOSIS — Z720 Tobacco use: Secondary | ICD-10-CM | POA: Diagnosis present

## 2023-09-21 DIAGNOSIS — S72002A Fracture of unspecified part of neck of left femur, initial encounter for closed fracture: Secondary | ICD-10-CM | POA: Diagnosis not present

## 2023-09-21 DIAGNOSIS — R4189 Other symptoms and signs involving cognitive functions and awareness: Secondary | ICD-10-CM

## 2023-09-21 DIAGNOSIS — Z751 Person awaiting admission to adequate facility elsewhere: Secondary | ICD-10-CM

## 2023-09-21 DIAGNOSIS — R718 Other abnormality of red blood cells: Secondary | ICD-10-CM

## 2023-09-21 DIAGNOSIS — F17210 Nicotine dependence, cigarettes, uncomplicated: Secondary | ICD-10-CM | POA: Diagnosis present

## 2023-09-21 DIAGNOSIS — M25552 Pain in left hip: Secondary | ICD-10-CM | POA: Diagnosis not present

## 2023-09-21 DIAGNOSIS — Y92129 Unspecified place in nursing home as the place of occurrence of the external cause: Secondary | ICD-10-CM

## 2023-09-21 DIAGNOSIS — Z043 Encounter for examination and observation following other accident: Secondary | ICD-10-CM | POA: Diagnosis not present

## 2023-09-21 DIAGNOSIS — R262 Difficulty in walking, not elsewhere classified: Secondary | ICD-10-CM | POA: Diagnosis not present

## 2023-09-21 DIAGNOSIS — R279 Unspecified lack of coordination: Secondary | ICD-10-CM | POA: Diagnosis not present

## 2023-09-21 DIAGNOSIS — Z6822 Body mass index (BMI) 22.0-22.9, adult: Secondary | ICD-10-CM

## 2023-09-21 DIAGNOSIS — E782 Mixed hyperlipidemia: Secondary | ICD-10-CM | POA: Diagnosis present

## 2023-09-21 DIAGNOSIS — M25522 Pain in left elbow: Secondary | ICD-10-CM | POA: Diagnosis not present

## 2023-09-21 DIAGNOSIS — R41841 Cognitive communication deficit: Secondary | ICD-10-CM | POA: Diagnosis not present

## 2023-09-21 DIAGNOSIS — N179 Acute kidney failure, unspecified: Secondary | ICD-10-CM | POA: Diagnosis not present

## 2023-09-21 DIAGNOSIS — I129 Hypertensive chronic kidney disease with stage 1 through stage 4 chronic kidney disease, or unspecified chronic kidney disease: Secondary | ICD-10-CM | POA: Diagnosis not present

## 2023-09-21 DIAGNOSIS — I10 Essential (primary) hypertension: Secondary | ICD-10-CM | POA: Diagnosis present

## 2023-09-21 DIAGNOSIS — S0191XA Laceration without foreign body of unspecified part of head, initial encounter: Secondary | ICD-10-CM | POA: Diagnosis present

## 2023-09-21 DIAGNOSIS — M4182 Other forms of scoliosis, cervical region: Secondary | ICD-10-CM | POA: Diagnosis not present

## 2023-09-21 DIAGNOSIS — N1831 Chronic kidney disease, stage 3a: Secondary | ICD-10-CM | POA: Diagnosis not present

## 2023-09-21 DIAGNOSIS — R627 Adult failure to thrive: Secondary | ICD-10-CM

## 2023-09-21 DIAGNOSIS — N184 Chronic kidney disease, stage 4 (severe): Secondary | ICD-10-CM | POA: Diagnosis not present

## 2023-09-21 DIAGNOSIS — M79642 Pain in left hand: Secondary | ICD-10-CM | POA: Diagnosis not present

## 2023-09-21 DIAGNOSIS — F17200 Nicotine dependence, unspecified, uncomplicated: Secondary | ICD-10-CM | POA: Diagnosis not present

## 2023-09-21 DIAGNOSIS — F0390 Unspecified dementia without behavioral disturbance: Secondary | ICD-10-CM | POA: Diagnosis present

## 2023-09-21 DIAGNOSIS — R4701 Aphasia: Secondary | ICD-10-CM | POA: Diagnosis not present

## 2023-09-21 DIAGNOSIS — Z79899 Other long term (current) drug therapy: Secondary | ICD-10-CM | POA: Diagnosis not present

## 2023-09-21 DIAGNOSIS — Z66 Do not resuscitate: Secondary | ICD-10-CM | POA: Diagnosis present

## 2023-09-21 DIAGNOSIS — K5900 Constipation, unspecified: Secondary | ICD-10-CM | POA: Diagnosis not present

## 2023-09-21 DIAGNOSIS — R609 Edema, unspecified: Secondary | ICD-10-CM | POA: Diagnosis not present

## 2023-09-21 DIAGNOSIS — Z7982 Long term (current) use of aspirin: Secondary | ICD-10-CM | POA: Diagnosis not present

## 2023-09-21 DIAGNOSIS — R531 Weakness: Secondary | ICD-10-CM | POA: Diagnosis present

## 2023-09-21 DIAGNOSIS — R9431 Abnormal electrocardiogram [ECG] [EKG]: Secondary | ICD-10-CM | POA: Diagnosis not present

## 2023-09-21 DIAGNOSIS — J449 Chronic obstructive pulmonary disease, unspecified: Secondary | ICD-10-CM | POA: Diagnosis not present

## 2023-09-21 DIAGNOSIS — S72012D Unspecified intracapsular fracture of left femur, subsequent encounter for closed fracture with routine healing: Secondary | ICD-10-CM | POA: Diagnosis not present

## 2023-09-21 DIAGNOSIS — Z8673 Personal history of transient ischemic attack (TIA), and cerebral infarction without residual deficits: Secondary | ICD-10-CM

## 2023-09-21 DIAGNOSIS — E559 Vitamin D deficiency, unspecified: Secondary | ICD-10-CM | POA: Diagnosis not present

## 2023-09-21 DIAGNOSIS — I639 Cerebral infarction, unspecified: Secondary | ICD-10-CM | POA: Diagnosis not present

## 2023-09-21 DIAGNOSIS — E440 Moderate protein-calorie malnutrition: Secondary | ICD-10-CM | POA: Diagnosis present

## 2023-09-21 DIAGNOSIS — S72012A Unspecified intracapsular fracture of left femur, initial encounter for closed fracture: Principal | ICD-10-CM | POA: Diagnosis present

## 2023-09-21 DIAGNOSIS — I739 Peripheral vascular disease, unspecified: Secondary | ICD-10-CM | POA: Diagnosis not present

## 2023-09-21 DIAGNOSIS — M542 Cervicalgia: Secondary | ICD-10-CM | POA: Diagnosis not present

## 2023-09-21 DIAGNOSIS — M6281 Muscle weakness (generalized): Secondary | ICD-10-CM | POA: Diagnosis not present

## 2023-09-21 DIAGNOSIS — T148XXA Other injury of unspecified body region, initial encounter: Secondary | ICD-10-CM | POA: Diagnosis not present

## 2023-09-21 DIAGNOSIS — Z96642 Presence of left artificial hip joint: Secondary | ICD-10-CM | POA: Diagnosis not present

## 2023-09-21 DIAGNOSIS — M4802 Spinal stenosis, cervical region: Secondary | ICD-10-CM | POA: Diagnosis not present

## 2023-09-21 DIAGNOSIS — R519 Headache, unspecified: Secondary | ICD-10-CM | POA: Diagnosis not present

## 2023-09-21 DIAGNOSIS — I2510 Atherosclerotic heart disease of native coronary artery without angina pectoris: Secondary | ICD-10-CM | POA: Diagnosis not present

## 2023-09-21 DIAGNOSIS — N1832 Chronic kidney disease, stage 3b: Secondary | ICD-10-CM | POA: Diagnosis not present

## 2023-09-21 DIAGNOSIS — W19XXXA Unspecified fall, initial encounter: Secondary | ICD-10-CM | POA: Diagnosis not present

## 2023-09-21 DIAGNOSIS — M4801 Spinal stenosis, occipito-atlanto-axial region: Secondary | ICD-10-CM | POA: Diagnosis not present

## 2023-09-21 DIAGNOSIS — S72002G Fracture of unspecified part of neck of left femur, subsequent encounter for closed fracture with delayed healing: Secondary | ICD-10-CM | POA: Diagnosis present

## 2023-09-21 DIAGNOSIS — R1312 Dysphagia, oropharyngeal phase: Secondary | ICD-10-CM | POA: Diagnosis not present

## 2023-09-21 DIAGNOSIS — M160 Bilateral primary osteoarthritis of hip: Secondary | ICD-10-CM | POA: Diagnosis not present

## 2023-09-21 DIAGNOSIS — F01518 Vascular dementia, unspecified severity, with other behavioral disturbance: Secondary | ICD-10-CM | POA: Diagnosis not present

## 2023-09-21 DIAGNOSIS — E785 Hyperlipidemia, unspecified: Secondary | ICD-10-CM | POA: Diagnosis not present

## 2023-09-21 DIAGNOSIS — H9190 Unspecified hearing loss, unspecified ear: Secondary | ICD-10-CM | POA: Diagnosis present

## 2023-09-21 DIAGNOSIS — Z471 Aftercare following joint replacement surgery: Secondary | ICD-10-CM | POA: Diagnosis not present

## 2023-09-21 LAB — BASIC METABOLIC PANEL WITH GFR
Anion gap: 13 (ref 5–15)
BUN: 20 mg/dL (ref 8–23)
CO2: 23 mmol/L (ref 22–32)
Calcium: 9.5 mg/dL (ref 8.9–10.3)
Chloride: 102 mmol/L (ref 98–111)
Creatinine, Ser: 1.69 mg/dL — ABNORMAL HIGH (ref 0.61–1.24)
GFR, Estimated: 40 mL/min — ABNORMAL LOW (ref >60)
Glucose, Bld: 122 mg/dL — ABNORMAL HIGH (ref 70–99)
Potassium: 4.1 mmol/L (ref 3.5–5.1)
Sodium: 138 mmol/L (ref 135–145)

## 2023-09-21 LAB — CBC WITH DIFFERENTIAL/PLATELET
Abs Immature Granulocytes: 0.05 K/uL (ref 0.00–0.07)
Basophils Absolute: 0.1 K/uL (ref 0.0–0.1)
Basophils Relative: 1 %
Eosinophils Absolute: 0 K/uL (ref 0.0–0.5)
Eosinophils Relative: 0 %
HCT: 46.9 % (ref 39.0–52.0)
Hemoglobin: 15.8 g/dL (ref 13.0–17.0)
Immature Granulocytes: 1 %
Lymphocytes Relative: 17 %
Lymphs Abs: 1.5 K/uL (ref 0.7–4.0)
MCH: 33.5 pg (ref 26.0–34.0)
MCHC: 33.7 g/dL (ref 30.0–36.0)
MCV: 99.4 fL (ref 80.0–100.0)
Monocytes Absolute: 0.5 K/uL (ref 0.1–1.0)
Monocytes Relative: 6 %
Neutro Abs: 6.7 K/uL (ref 1.7–7.7)
Neutrophils Relative %: 75 %
Platelets: 294 K/uL (ref 150–400)
RBC: 4.72 MIL/uL (ref 4.22–5.81)
RDW: 12.5 % (ref 11.5–15.5)
WBC: 8.8 K/uL (ref 4.0–10.5)
nRBC: 0 % (ref 0.0–0.2)

## 2023-09-21 LAB — PROTIME-INR
INR: 0.9 (ref 0.8–1.2)
Prothrombin Time: 12.9 s (ref 11.4–15.2)

## 2023-09-21 LAB — MRSA NEXT GEN BY PCR, NASAL: MRSA by PCR Next Gen: NOT DETECTED

## 2023-09-21 MED ORDER — FENTANYL CITRATE (PF) 100 MCG/2ML IJ SOLN
50.0000 ug | Freq: Once | INTRAMUSCULAR | Status: AC
  Administered 2023-09-21: 50 ug via INTRAVENOUS
  Filled 2023-09-21: qty 2

## 2023-09-21 MED ORDER — ASPIRIN 81 MG PO CHEW
81.0000 mg | CHEWABLE_TABLET | Freq: Every day | ORAL | Status: DC
  Administered 2023-09-23 – 2023-09-25 (×3): 81 mg via ORAL
  Filled 2023-09-21 (×4): qty 1

## 2023-09-21 MED ORDER — ATORVASTATIN CALCIUM 20 MG PO TABS
20.0000 mg | ORAL_TABLET | Freq: Every day | ORAL | Status: DC
  Administered 2023-09-23 – 2023-09-25 (×3): 20 mg via ORAL
  Filled 2023-09-21: qty 2
  Filled 2023-09-21 (×3): qty 1

## 2023-09-21 MED ORDER — NICOTINE 21 MG/24HR TD PT24
21.0000 mg | MEDICATED_PATCH | Freq: Every day | TRANSDERMAL | Status: DC
  Filled 2023-09-21: qty 1

## 2023-09-21 MED ORDER — ONDANSETRON HCL 4 MG/2ML IJ SOLN
4.0000 mg | Freq: Four times a day (QID) | INTRAMUSCULAR | Status: DC | PRN
Start: 2023-09-21 — End: 2023-09-22

## 2023-09-21 MED ORDER — ACETAMINOPHEN 650 MG RE SUPP: 650.0000 mg | Freq: Four times a day (QID) | RECTAL | Status: DC | PRN

## 2023-09-21 MED ORDER — ACETAMINOPHEN 325 MG PO TABS: 650.0000 mg | ORAL_TABLET | Freq: Four times a day (QID) | ORAL | Status: DC | PRN

## 2023-09-21 MED ORDER — AMLODIPINE BESYLATE 5 MG PO TABS
5.0000 mg | ORAL_TABLET | Freq: Every day | ORAL | Status: DC
  Administered 2023-09-24 – 2023-09-25 (×2): 5 mg via ORAL
  Filled 2023-09-21 (×4): qty 1

## 2023-09-21 MED ORDER — RAMIPRIL 5 MG PO CAPS
5.0000 mg | ORAL_CAPSULE | Freq: Every day | ORAL | Status: DC
  Filled 2023-09-21 (×2): qty 1

## 2023-09-21 MED ORDER — HYDROMORPHONE HCL 1 MG/ML IJ SOLN
0.5000 mg | INTRAMUSCULAR | Status: DC | PRN
  Administered 2023-09-21: 0.5 mg via INTRAVENOUS
  Filled 2023-09-21: qty 0.5

## 2023-09-21 MED ORDER — MORPHINE SULFATE (PF) 4 MG/ML IV SOLN
4.0000 mg | Freq: Once | INTRAVENOUS | Status: AC
  Administered 2023-09-21: 4 mg via INTRAVENOUS
  Filled 2023-09-21: qty 1

## 2023-09-21 MED ORDER — ONDANSETRON HCL 4 MG PO TABS: 4.0000 mg | ORAL_TABLET | Freq: Four times a day (QID) | ORAL | Status: DC | PRN

## 2023-09-21 MED ORDER — HEPARIN SODIUM (PORCINE) 5000 UNIT/ML IJ SOLN
5000.0000 [IU] | Freq: Three times a day (TID) | INTRAMUSCULAR | Status: DC
  Administered 2023-09-21 – 2023-09-22 (×2): 5000 [IU] via SUBCUTANEOUS
  Filled 2023-09-21 (×2): qty 1

## 2023-09-21 NOTE — Assessment & Plan Note (Addendum)
 BP controlled; but duplicative ACE-I therapy addressed by discontinuation of Ramipril . Lisinopril continued.

## 2023-09-21 NOTE — H&P (Signed)
 History and Physical    Jerry Rodriguez FMW:[id number] DOB: Mar 26, 1940 DOA: 09/21/2023  PCP: Landy Barnie RAMAN, NP   Patient coming from: [facility]  Chief Complaint: Fall with left hip pain  HPI: Jerry Rodriguez is a 83 y.o. male with medical history significant for hypertension, CKD stage IIIa, mixed hyperlipidemia, dementia, history of CVA, moderate cognitive impairment, moderate protein calorie malnutrition, aortic atherosclerosis, tobacco abuse, and hearing difficulty who presented from his SNF after an unwitnessed fall this morning.  He was noted to have a small laceration to the left side of his head and complains of left hip pain.  No further significant history could be obtained given his condition with extreme hearing loss.   ED Course: Vital signs with pulse 105 and temperature 97.5.  Creatinine 1.69.  Imaging studies demonstrating left subcapital hip fracture and EDP discussed with orthopedics with plans to evaluate and manage soon.  Patient given some pain medications in the ED and appears to have continued pain.  Review of Systems: Cannot be obtained given his current condition.  Past Medical History:  Diagnosis Date   Carotid artery occlusion    COPD (chronic obstructive pulmonary disease) (HCC)    Hypertension    Peripheral vascular disease (HCC)    Stroke (HCC)     No past surgical history on file.   reports that he has been smoking cigarettes. He has a 60 pack-year smoking history. He has never used smokeless tobacco. He reports that he does not drink alcohol and does not use drugs.  No Known Allergies  No family history on file.  Prior to Admission medications   Medication Sig Start Date End Date Taking? Authorizing Provider  amLODipine  (NORVASC ) 5 MG tablet Take 1 tablet (5 mg total) by mouth daily. 06/12/23  Yes Pearlean Manus, MD  aspirin  81 MG tablet Take 1 tablet (81 mg total) by mouth daily with breakfast. 06/12/23  Yes Emokpae, Courage, MD   atorvastatin  (LIPITOR) 20 MG tablet Take 20 mg by mouth daily. 02/15/15  Yes [provider]  cholecalciferol (VITAMIN D3) 25 MCG (1000 UNIT) tablet Take 2,000 Units by mouth daily.   Yes [provider]  ketoconazole (NIZORAL) 2 % cream Apply 1 Application topically 2 (two) times daily. 06/22/23  Yes [provider]  lisinopril (ZESTRIL) 20 MG tablet Take 20 mg by mouth daily.   Yes [provider]  nicotine  (NICODERM CQ  - DOSED IN MG/24 HOURS) 21 mg/24hr patch Place 1 patch (21 mg total) onto the skin daily. 06/13/23  Yes Emokpae, Courage, MD  ramipril  (ALTACE ) 5 MG capsule Take 1 capsule by mouth daily. 06/09/20  Yes [provider]    Physical Exam: Vitals:   09/21/23 1415 09/21/23 1445 09/21/23 1452 09/21/23 1519  BP: 110/83 (!) 160/95 (!) 149/82 (!) 163/80  Pulse: 87 89 98 (!) 101  Resp:  (!) 22 20 19   Temp:   97.7 F (36.5 C) (!) 97.5 F (36.4 C)  TempSrc:   Oral Axillary  SpO2: 97% 94% 94% 97%  Weight:    69.3 kg  Height:    5' 9 (1.753 m)    Constitutional: NAD, calm, comfortable, hard of hearing Vitals:   09/21/23 1415 09/21/23 1445 09/21/23 1452 09/21/23 1519  BP: 110/83 (!) 160/95 (!) 149/82 (!) 163/80  Pulse: 87 89 98 (!) 101  Resp:  (!) 22 20 19   Temp:   97.7 F (36.5 C) (!) 97.5 F (36.4 C)  TempSrc:   Oral  Axillary  SpO2: 97% 94% 94% 97%  Weight:    69.3 kg  Height:    5' 9 (1.753 m)   Eyes: lids and conjunctivae normal Neck: normal, supple Respiratory: clear to auscultation bilaterally. Normal respiratory effort. No accessory muscle use.  Cardiovascular: Regular rate and rhythm, no murmurs. Abdomen: no tenderness, no distention. Bowel sounds positive.  Musculoskeletal:  No edema. Skin: no rashes, lesions, ulcers.  Psychiatric: Flat affect  Labs on Admission: I have personally reviewed following labs and imaging studies  CBC: Recent Labs  Lab 09/20/23 0713 09/21/23 1102  WBC 6.9 8.8  NEUTROABS  --  6.7   HGB 14.4 15.8  HCT 42.6 46.9  MCV 100.9* 99.4  PLT 258 294   Basic Metabolic Panel: Recent Labs  Lab 09/20/23 0713 09/21/23 1102  NA 139 138  K 3.7 4.1  CL 104 102  CO2 23 23  GLUCOSE 105* 122*  BUN 25* 20  CREATININE 1.65* 1.69*  CALCIUM  9.1 9.5   GFR: Estimated Creatinine Clearance: 32.5 mL/min (A) (by C-G formula based on SCr of 1.69 mg/dL (H)). Liver Function Tests: Recent Labs  Lab 09/20/23 0713  AST 25  ALT 28  ALKPHOS 106  BILITOT 0.8  PROT 6.6  ALBUMIN 2.8*   No results for input(s): LIPASE, AMYLASE in the last 168 hours. No results for input(s): AMMONIA in the last 168 hours. Coagulation Profile: Recent Labs  Lab 09/21/23 1102  INR 0.9   Cardiac Enzymes: No results for input(s): CKTOTAL, CKMB, CKMBINDEX, TROPONINI in the last 168 hours. BNP (last 3 results) No results for input(s): PROBNP in the last 8760 hours. HbA1C: No results for input(s): HGBA1C in the last 72 hours. CBG: No results for input(s): GLUCAP in the last 168 hours. Lipid Profile: Recent Labs    09/20/23 0714  CHOL 168  HDL 29*  LDLCALC 121*  TRIG 92  CHOLHDL 5.8   Thyroid Function Tests: No results for input(s): TSH, T4TOTAL, FREET4, T3FREE, THYROIDAB in the last 72 hours. Anemia Panel: No results for input(s): VITAMINB12, FOLATE, FERRITIN, TIBC, IRON, RETICCTPCT in the last 72 hours. Urine analysis:    Component Value Date/Time   COLORURINE YELLOW 03/17/2021 1825   APPEARANCEUR CLEAR 03/17/2021 1825   LABSPEC 1.045 (H) 03/17/2021 1825   PHURINE 5.0 03/17/2021 1825   GLUCOSEU NEGATIVE 03/17/2021 1825   HGBUR SMALL (A) 03/17/2021 1825   BILIRUBINUR NEGATIVE 03/17/2021 1825   KETONESUR NEGATIVE 03/17/2021 1825   PROTEINUR NEGATIVE 03/17/2021 1825   UROBILINOGEN 0.2 09/16/2010 0912   NITRITE NEGATIVE 03/17/2021 1825   LEUKOCYTESUR NEGATIVE 03/17/2021 1825    Radiological Exams on Admission: DG Hip Unilat With Pelvis 2-3  Views Left Result Date: 09/21/2023 CLINICAL DATA:  Pain after fall. EXAM: DG HIP (WITH OR WITHOUT PELVIS) 2-3V LEFT COMPARISON:  CT abdomen/pelvis dated 06/09/2023. FINDINGS: Subcapital fracture of the left femoral head and neck junction with angulation and foreshortening. The left femoral head is seated in the acetabulum. Mild-to-moderate osteoarthritis of the bilateral hips. Sacroiliac joints and pubic symphysis appear anatomically aligned. IMPRESSION: Subcapital fracture of the left femoral head and neck junction with angulation and foreshortening. Electronically Signed   By: Harrietta Sherry M.D.   On: 09/21/2023 12:58   DG Hand Complete Left Result Date: 09/21/2023 CLINICAL DATA:  Pain after fall. EXAM: LEFT HAND - COMPLETE 3+ VIEW COMPARISON:  None Available. FINDINGS: Flexion deformity at the fifth PIP joint, cannot exclude dislocation. No acute fracture identified. Mild-to-moderate diffuse interphalangeal joint space narrowing, most  pronounced at the fourth DIP joint. Cutaneous irregularity of the tip of the first through third digits. Punctate radiodensities projecting over the distal second and fourth digits, near the level of the nailbed. IMPRESSION: 1. Flexion deformity at the fifth PIP joint, cannot exclude dislocation. No acute fracture identified. 2. Cutaneous irregularity of the tip of the first through third digits. Punctate radiodensities projecting over the distal second and fourth digits, near the level of the nailbed. Radiopaque foreign body is not excluded. Electronically Signed   By: Harrietta Sherry M.D.   On: 09/21/2023 12:55   DG Elbow Complete Left Result Date: 09/21/2023 CLINICAL DATA:  Pain after fall. EXAM: LEFT ELBOW - COMPLETE 3+ VIEW COMPARISON:  None Available. FINDINGS: There is no evidence of acute fracture, dislocation, or sizable joint effusion. There is no evidence of significant arthropathy. Soft tissues are unremarkable. IMPRESSION: No acute fracture or dislocation of  the left elbow. Electronically Signed   By: Harrietta Sherry M.D.   On: 09/21/2023 12:34   DG Chest 1 View Result Date: 09/21/2023 CLINICAL DATA:  Fall. EXAM: CHEST  1 VIEW COMPARISON:  06/09/2023. FINDINGS: The heart size and mediastinal contours are unchanged. Bilateral interstitial coarsening, could reflect edema or an infectious/inflammatory process. No focal consolidation, pleural effusion, or pneumothorax. Diffuse osseous demineralization. No acute osseous abnormality. IMPRESSION: Bilateral interstitial coarsening could reflect edema or an infectious/inflammatory process. Electronically Signed   By: Harrietta Sherry M.D.   On: 09/21/2023 12:33   CT Cervical Spine Wo Contrast Result Date: 09/21/2023 CLINICAL DATA:  83 year old male status post unwitnessed fall. Left head laceration. Pain. EXAM: CT CERVICAL SPINE WITHOUT CONTRAST TECHNIQUE: Multidetector CT imaging of the cervical spine was performed without intravenous contrast. Multiplanar CT image reconstructions were also generated. RADIATION DOSE REDUCTION: This exam was performed according to the departmental dose-optimization program which includes automated exposure control, adjustment of the mA and/or kV according to patient size and/or use of iterative reconstruction technique. COMPARISON:  Head CT today.  Cervical spine MRI 03/18/2021. FINDINGS: Alignment: Straightening of cervical lordosis appears to be chronic, stable. Mild dextroconvex cervical scoliosis. Cervicothoracic junction alignment is within normal limits. Bilateral posterior element alignment is within normal limits. 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 Visualized skull base is intact. No atlanto-occipital dissociation. Maintained C1-C2 alignment. Other cervical vertebrae appear intact. No acute osseous abnormality identified. Soft tissues and spinal canal: No prevertebral fluid or swelling. No visible canal hematoma.  Mild for age calcified cervical carotid atherosclerosis. Disc levels: Chronic cervical spine degeneration, and pronounced chronic ligamentous hypertrophy at C1-C2 in the setting of ununited odontoid fracture or os odontoideum. Chronic C1 level cervical spinal stenosis, probably stable from 2023 MRI. Ordinary chronic disc and endplate degeneration elsewhere in the cervical spine. Up to mild additional spinal stenosis at those levels. Upper chest: Visible upper thoracic levels appear intact. Negative visible noncontrast thoracic inlet. IMPRESSION: 1. No acute traumatic injury identified in the cervical spine. 2. Chronic odontoid fragment and associated chronic severe C1-C2 spinal stenosis from ligamentous hypertrophy. Similar appearance to a 2023 MRI. Electronically Signed   By: VEAR Hurst M.D.   On: 09/21/2023 12:16   CT Head Wo Contrast Result Date: 09/21/2023 CLINICAL DATA:  83 year old male status post unwitnessed fall. Left head laceration. Pain. EXAM: CT HEAD WITHOUT CONTRAST TECHNIQUE: Contiguous axial images were obtained from the base of the skull through the vertex without intravenous contrast. RADIATION DOSE REDUCTION: This exam was performed according to the departmental dose-optimization program  which includes automated exposure control, adjustment of the mA and/or kV according to patient size and/or use of iterative reconstruction technique. COMPARISON:  Brain MRI,Head CT 03/17/2021. FINDINGS: Brain: Stable cerebral volume. Chronic encephalomalacia posterior right MCA territory. New but circumscribed and chronic appearing lacunar infarct anterior right external capsule, anterior right basal ganglia. No midline shift, ventriculomegaly, mass effect, evidence of mass lesion, intracranial hemorrhage or evidence of cortically based acute infarction. Vascular: Calcified atherosclerosis at the skull base. No suspicious intracranial vascular hyperdensity. Skull: Appears stable and intact. No acute osseous  abnormality identified. Sinuses/Orbits: Visualized paranasal sinuses and mastoids are stable and well aerated. Other: No discrete orbit or scalp soft tissue injury identified. IMPRESSION: 1. No acute intracranial abnormality or acute traumatic injury identified. 2. Chronic posterior right MCA territory infarct. And chronic but new since 2023 lacunar infarct at the anterior right external capsule. Electronically Signed   By: VEAR Hurst M.D.   On: 09/21/2023 12:12    EKG: Independently reviewed. SR 89bpm.  Assessment/Plan Principal Problem:   Hip fracture (HCC) Active Problems:   Essential hypertension   Mixed hyperlipidemia   Tobacco abuse   History of CVA (cerebrovascular accident)   Benign hypertension with CKD (chronic kidney disease) stage III (HCC)   Moderate cognitive impairment   Moderate protein malnutrition (HCC)   Adult failure to thrive syndrome    Left subcapital hip fracture status post fall - Fall precautions - Appreciate orthopedic evaluations - Patient to remain n.p.o. after midnight  Hypertension - Continue lisinopril and Norvasc   CKD stage IIIa - Continue to monitor creatinine levels which appear to be near baseline  History of CVA with dyslipidemia/aortic atherosclerosis - Continue aspirin  and Lipitor  Moderate cognitive impairment/severe hearing loss - Difficult to communicate  Moderate protein calorie malnutrition - Noted to be started on Pro-Stat twice daily  Tobacco abuse - Noted to be on NicoDerm patch 21 mg daily, continue same   DVT prophylaxis: Heparin  Code Status: DNR Family Communication: Son at bedside 8/29 Disposition Plan:Admit for hip fracture repair Consults called: Orthopedics by EDP Dr. Margrette Admission status: Inpatient, telemetry  Severity of Illness: The appropriate patient status for this patient is INPATIENT. Inpatient status is judged to be reasonable and necessary in order to provide the required intensity of service to  ensure the patient's safety. The patient's presenting symptoms, physical exam findings, and initial radiographic and laboratory data in the context of their chronic comorbidities is felt to place them at high risk for further clinical deterioration. Furthermore, it is not anticipated that the patient will be medically stable for discharge from the hospital within 2 midnights of admission.   * I certify that at the point of admission it is my clinical judgment that the patient will require inpatient hospital care spanning beyond 2 midnights from the point of admission due to high intensity of service, high risk for further deterioration and high frequency of surveillance required.*   Dontrel Smethers D Rindi Beechy DO [facility]  If 7PM-7AM, please contact night-coverage [email or web address]  09/21/2023, 3:21 PM

## 2023-09-21 NOTE — Assessment & Plan Note (Signed)
 Speech Therapy will perform MMSE.

## 2023-09-21 NOTE — Consult Note (Addendum)
 ORTHOPAEDIC CONSULTATION  REQUESTING PHYSICIAN: Towana Ozell BROCKS, MD  ASSESSMENT AND PLAN:  The procedure has been fully reviewed with the patient's son; The risks and benefits of surgery have been discussed and explained and understood. Alternative treatment has also been reviewed, questions were encouraged and answered. The postoperative plan is also been reviewed.  Specific to hip fracture Dislocation Fracture DVT/PE Limb length discrepancy Decrease in ambulatory status by 1 level    83 y.o. male dementia fractured left hip, recommend bipolar partial left hip replacement  Chief Complaint: Left hip pain  HPI: Because of the patient's dementia the history is taken from the ER record and his son EHAB HUMBER is a 83 y.o. male with history of a mechanical fall, history of recent weakness just admitted to a facility to help with strengthening and gait although recently was helping his son in the field presented to the facility and then fell and fractured his left hip  Date of injury 09/21/2023 Pain located left hip Pain quality nonradiating sharp with movement less with rest Pain increased with motion Injury associated with limb deformity   Past Medical History:  Diagnosis Date   Carotid artery occlusion    COPD (chronic obstructive pulmonary disease) (HCC)    Hypertension    Peripheral vascular disease (HCC)    Stroke (HCC)    No past surgical history on file. Social History   Socioeconomic History   Marital status: Married    Spouse name: Not on file   Number of children: Not on file   Years of education: Not on file   Highest education level: Not on file  Occupational History   Not on file  Tobacco Use   Smoking status: Every Day    Current packs/day: 1.00    Average packs/day: 1 pack/day for 60.0 years (60.0 ttl pk-yrs)    Types: Cigarettes   Smokeless tobacco: Never  Vaping Use   Vaping status: Never Used  Substance and Sexual Activity   Alcohol  use: No    Alcohol/week: 0.0 standard drinks of alcohol   Drug use: No   Sexual activity: Not on file  Other Topics Concern   Not on file  Social History Narrative   Not on file   Social Drivers of Health   Financial Resource Strain: Not on file  Food Insecurity: No Food Insecurity (06/10/2023)   Hunger Vital Sign    Worried About Running Out of Food in the Last Year: Never true    Ran Out of Food in the Last Year: Never true  Transportation Needs: No Transportation Needs (06/10/2023)   PRAPARE - Administrator, Civil Service (Medical): No    Lack of Transportation (Non-Medical): No  Physical Activity: Not on file  Stress: Not on file  Social Connections: Moderately Isolated (06/10/2023)   Social Connection and Isolation Panel    Frequency of Communication with Friends and Family: Once a week    Frequency of Social Gatherings with Friends and Family: Twice a week    Attends Religious Services: 1 to 4 times per year    Active Member of [employer] or Organizations: No    Attends Banker Meetings: Never    Marital Status: Widowed   No family history on file. No Known Allergies Prior to Admission medications   Medication Sig Start Date End Date Taking? Authorizing Provider  amLODipine  (NORVASC ) 5 MG tablet Take 1 tablet (5 mg total) by mouth daily. 06/12/23  Pearlean Manus, MD  aspirin  81 MG tablet Take 1 tablet (81 mg total) by mouth daily with breakfast. 06/12/23   Emokpae, Courage, MD  atorvastatin  (LIPITOR) 20 MG tablet Take 20 mg by mouth daily. 02/15/15   [provider]  cholecalciferol (VITAMIN D3) 25 MCG (1000 UNIT) tablet Take 2,000 Units by mouth daily.    [provider]  nicotine  (NICODERM CQ  - DOSED IN MG/24 HOURS) 21 mg/24hr patch Place 1 patch (21 mg total) onto the skin daily. 06/13/23   Pearlean Manus, MD  ramipril  (ALTACE ) 5 MG capsule Take 1 capsule by mouth daily. 06/09/20   [provider]   Imaging with personal  interpretation   DG Hip Unilat With Pelvis 2-3 Views Left Result Date: 09/21/2023 CLINICAL DATA:  Pain after fall. EXAM: DG HIP (WITH OR WITHOUT PELVIS) 2-3V LEFT COMPARISON:  CT abdomen/pelvis dated 06/09/2023. FINDINGS: Subcapital fracture of the left femoral head and neck junction with angulation and foreshortening. The left femoral head is seated in the acetabulum. Mild-to-moderate osteoarthritis of the bilateral hips. Sacroiliac joints and pubic symphysis appear anatomically aligned. IMPRESSION: Subcapital fracture of the left femoral head and neck junction with angulation and foreshortening. Electronically Signed   By: Harrietta Sherry M.D.   On: 09/21/2023 12:58   Left femoral neck fracture complete displacement   DG Hand Complete Left Result Date: 09/21/2023 CLINICAL DATA:  Pain after fall. EXAM: LEFT HAND - COMPLETE 3+ VIEW COMPARISON:  None Available. FINDINGS: Flexion deformity at the fifth PIP joint, cannot exclude dislocation. No acute fracture identified. Mild-to-moderate diffuse interphalangeal joint space narrowing, most pronounced at the fourth DIP joint. Cutaneous irregularity of the tip of the first through third digits. Punctate radiodensities projecting over the distal second and fourth digits, near the level of the nailbed. IMPRESSION: 1. Flexion deformity at the fifth PIP joint, cannot exclude dislocation. No acute fracture identified. 2. Cutaneous irregularity of the tip of the first through third digits. Punctate radiodensities projecting over the distal second and fourth digits, near the level of the nailbed. Radiopaque foreign body is not excluded. Electronically Signed   By: Harrietta Sherry M.D.   On: 09/21/2023 12:55   Left hand PIP joint deformity  left small finger   DG Elbow Complete Left Result Date: 09/21/2023 CLINICAL DATA:  Pain after fall. EXAM: LEFT ELBOW - COMPLETE 3+ VIEW COMPARISON:  None Available. FINDINGS: There is no evidence of acute fracture,  dislocation, or sizable joint effusion. There is no evidence of significant arthropathy. Soft tissues are unremarkable. IMPRESSION: No acute fracture or dislocation of the left elbow. Electronically Signed   By: Harrietta Sherry M.D.   On: 09/21/2023 12:34   Left elbow no fracture   DG Chest 1 View Result Date: 09/21/2023 CLINICAL DATA:  Fall. EXAM: CHEST  1 VIEW COMPARISON:  06/09/2023. FINDINGS: The heart size and mediastinal contours are unchanged. Bilateral interstitial coarsening, could reflect edema or an infectious/inflammatory process. No focal consolidation, pleural effusion, or pneumothorax. Diffuse osseous demineralization. No acute osseous abnormality. IMPRESSION: Bilateral interstitial coarsening could reflect edema or an infectious/inflammatory process. Electronically Signed   By: Harrietta Sherry M.D.   On: 09/21/2023 12:33   CT Cervical Spine Wo Contrast Result Date: 09/21/2023 CLINICAL DATA:  83 year old male status post unwitnessed fall. Left head laceration. Pain. EXAM: CT CERVICAL SPINE WITHOUT CONTRAST TECHNIQUE: Multidetector CT imaging of the cervical spine was performed without intravenous contrast. Multiplanar CT image reconstructions were also generated. RADIATION DOSE REDUCTION: This exam was performed according to  the departmental dose-optimization program which includes automated exposure control, adjustment of the mA and/or kV according to patient size and/or use of iterative reconstruction technique. COMPARISON:  Head CT today.  Cervical spine MRI 03/18/2021. FINDINGS: Alignment: Straightening of cervical lordosis appears to be chronic, stable. Mild dextroconvex cervical scoliosis. Cervicothoracic junction alignment is within normal limits. Bilateral posterior element alignment is within normal limits. 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 Visualized skull base is intact. No atlanto-occipital dissociation.  Maintained C1-C2 alignment. Other cervical vertebrae appear intact. No acute osseous abnormality identified. Soft tissues and spinal canal: No prevertebral fluid or swelling. No visible canal hematoma. Mild for age calcified cervical carotid atherosclerosis. Disc levels: Chronic cervical spine degeneration, and pronounced chronic ligamentous hypertrophy at C1-C2 in the setting of ununited odontoid fracture or os odontoideum. Chronic C1 level cervical spinal stenosis, probably stable from 2023 MRI. Ordinary chronic disc and endplate degeneration elsewhere in the cervical spine. Up to mild additional spinal stenosis at those levels. Upper chest: Visible upper thoracic levels appear intact. Negative visible noncontrast thoracic inlet. IMPRESSION: 1. No acute traumatic injury identified in the cervical spine. 2. Chronic odontoid fragment and associated chronic severe C1-C2 spinal stenosis from ligamentous hypertrophy. Similar appearance to a 2023 MRI. Electronically Signed   By: VEAR Hurst M.D.   On: 09/21/2023 12:16    CT cervical spine no fracture degenerative disc disease   CT Head Wo Contrast Result Date: 09/21/2023 CLINICAL DATA:  83 year old male status post unwitnessed fall. Left head laceration. Pain. EXAM: CT HEAD WITHOUT CONTRAST TECHNIQUE: Contiguous axial images were obtained from the base of the skull through the vertex without intravenous contrast. RADIATION DOSE REDUCTION: This exam was performed according to the departmental dose-optimization program which includes automated exposure control, adjustment of the mA and/or kV according to patient size and/or use of iterative reconstruction technique. COMPARISON:  Brain MRI,Head CT 03/17/2021. FINDINGS: Brain: Stable cerebral volume. Chronic encephalomalacia posterior right MCA territory. New but circumscribed and chronic appearing lacunar infarct anterior right external capsule, anterior right basal ganglia. No midline shift, ventriculomegaly, mass  effect, evidence of mass lesion, intracranial hemorrhage or evidence of cortically based acute infarction. Vascular: Calcified atherosclerosis at the skull base. No suspicious intracranial vascular hyperdensity. Skull: Appears stable and intact. No acute osseous abnormality identified. Sinuses/Orbits: Visualized paranasal sinuses and mastoids are stable and well aerated. Other: No discrete orbit or scalp soft tissue injury identified. IMPRESSION: 1. No acute intracranial abnormality or acute traumatic injury identified. 2. Chronic posterior right MCA territory infarct. And chronic but new since 2023 lacunar infarct at the anterior right external capsule. Electronically Signed   By: VEAR Hurst M.D.   On: 09/21/2023 12:12   Family History Reviewed and non-contributory, no pertinent history of problems with bleeding or anesthesia    ROS  Cannot assess due to dementia, the patient's son says the patient has been recently weak and unable to ambulate associated or determine at present to be secondary to weather changes causing the patient to be less active.   OBJECTIVE  Vitals:Patient Vitals for the past 8 hrs:  BP Temp Temp src Pulse Resp SpO2 Height Weight  09/21/23 1050 -- 97.7 F (36.5 C) Oral -- 20 -- 5' 9 (1.753 m) 69.4 kg  09/21/23 1045 (!) 172/86 -- -- 89 -- 95 % -- --   General: Alert, no acute distress Cardiovascular: Warm extremities noted Respiratory: No cyanosis, no use of accessory musculature GI: No organomegaly, abdomen is soft  and non-tender Skin: No lesions in the area of chief complaint other than those listed below in MSK exam.  Neurologic: Sensation intact distally save for the below mentioned MSK exam Psychiatric: Patient is not competent for consent however normal mood and affect Lymphatic: No swelling obvious and reported other than the area involved in the exam below   Extremities  Normal findings of the extremities include no lacerations of the skin no erythema, no  tenderness, functional range of motion, no joint subluxations, normal muscle tone with the following exceptions Left hip skin normal, tenderness left proximal hip, no range of motion at the hip, external rotation shortening deformity, muscle tone normal.   Labs cbc Recent Labs    09/20/23 0713 09/21/23 1102  WBC 6.9 8.8  HGB 14.4 15.8  HCT 42.6 46.9  PLT 258 294   Hemoglobin 14 Platelet count 258   Labs inflam No results for input(s): CRP in the last 72 hours.  Invalid input(s): ESR  Labs coag Recent Labs    09/21/23 1102  INR 0.9   INR 0.9  Recent Labs    09/20/23 0713 09/21/23 1102  NA 139 138  K 3.7 4.1  CL 104 102  CO2 23 23  GLUCOSE 105* 122*  BUN 25* 20  CREATININE 1.65* 1.69*  CALCIUM  9.1 9.5    Potassium 3.7 BUN/creatinine 25 and 1.65

## 2023-09-21 NOTE — H&P (View-Only) (Signed)
 ORTHOPAEDIC CONSULTATION  REQUESTING PHYSICIAN: Jerry Ozell BROCKS, MD  ASSESSMENT AND PLAN:  The procedure has been fully reviewed with the patient's son; The risks and benefits of surgery have been discussed and explained and understood. Alternative treatment has also been reviewed, questions were encouraged and answered. The postoperative plan is also been reviewed.  Specific to hip fracture Dislocation Fracture DVT/PE Limb length discrepancy Decrease in ambulatory status by 1 level    83 y.o. male dementia fractured left hip, recommend bipolar partial left hip replacement  Chief Complaint: Left hip pain  HPI: Because of the patient's dementia the history is taken from the ER record and his son Jerry Rodriguez is a 83 y.o. male with history of a mechanical fall, history of recent weakness just admitted to a facility to help with strengthening and gait although recently was helping his son in the field presented to the facility and then fell and fractured his left hip  Date of injury 09/21/2023 Pain located left hip Pain quality nonradiating sharp with movement less with rest Pain increased with motion Injury associated with limb deformity   Past Medical History:  Diagnosis Date   Carotid artery occlusion    COPD (chronic obstructive pulmonary disease) (HCC)    Hypertension    Peripheral vascular disease (HCC)    Stroke (HCC)    No past surgical history on file. Social History   Socioeconomic History   Marital status: Married    Spouse name: Not on file   Number of children: Not on file   Years of education: Not on file   Highest education level: Not on file  Occupational History   Not on file  Tobacco Use   Smoking status: Every Day    Current packs/day: 1.00    Average packs/day: 1 pack/day for 60.0 years (60.0 ttl pk-yrs)    Types: Cigarettes   Smokeless tobacco: Never  Vaping Use   Vaping status: Never Used  Substance and Sexual Activity   Alcohol  use: No    Alcohol/week: 0.0 standard drinks of alcohol   Drug use: No   Sexual activity: Not on file  Other Topics Concern   Not on file  Social History Narrative   Not on file   Social Drivers of Health   Financial Resource Strain: Not on file  Food Insecurity: No Food Insecurity (06/10/2023)   Hunger Vital Sign    Worried About Running Out of Food in the Last Year: Never true    Ran Out of Food in the Last Year: Never true  Transportation Needs: No Transportation Needs (06/10/2023)   PRAPARE - Administrator, Civil Service (Medical): No    Lack of Transportation (Non-Medical): No  Physical Activity: Not on file  Stress: Not on file  Social Connections: Moderately Isolated (06/10/2023)   Social Connection and Isolation Panel    Frequency of Communication with Friends and Family: Once a week    Frequency of Social Gatherings with Friends and Family: Twice a week    Attends Religious Services: 1 to 4 times per year    Active Member of [employer] or Organizations: No    Attends Banker Meetings: Never    Marital Status: Widowed   No family history on file. No Known Allergies Prior to Admission medications   Medication Sig Start Date End Date Taking? Authorizing Provider  amLODipine  (NORVASC ) 5 MG tablet Take 1 tablet (5 mg total) by mouth daily. 06/12/23  Pearlean Manus, MD  aspirin  81 MG tablet Take 1 tablet (81 mg total) by mouth daily with breakfast. 06/12/23   Emokpae, Courage, MD  atorvastatin  (LIPITOR) 20 MG tablet Take 20 mg by mouth daily. 02/15/15   [provider]  cholecalciferol (VITAMIN D3) 25 MCG (1000 UNIT) tablet Take 2,000 Units by mouth daily.    [provider]  nicotine  (NICODERM CQ  - DOSED IN MG/24 HOURS) 21 mg/24hr patch Place 1 patch (21 mg total) onto the skin daily. 06/13/23   Pearlean Manus, MD  ramipril  (ALTACE ) 5 MG capsule Take 1 capsule by mouth daily. 06/09/20   [provider]   Imaging with personal  interpretation   DG Hip Unilat With Pelvis 2-3 Views Left Result Date: 09/21/2023 CLINICAL DATA:  Pain after fall. EXAM: DG HIP (WITH OR WITHOUT PELVIS) 2-3V LEFT COMPARISON:  CT abdomen/pelvis dated 06/09/2023. FINDINGS: Subcapital fracture of the left femoral head and neck junction with angulation and foreshortening. The left femoral head is seated in the acetabulum. Mild-to-moderate osteoarthritis of the bilateral hips. Sacroiliac joints and pubic symphysis appear anatomically aligned. IMPRESSION: Subcapital fracture of the left femoral head and neck junction with angulation and foreshortening. Electronically Signed   By: Harrietta Sherry M.D.   On: 09/21/2023 12:58   Left femoral neck fracture complete displacement   DG Hand Complete Left Result Date: 09/21/2023 CLINICAL DATA:  Pain after fall. EXAM: LEFT HAND - COMPLETE 3+ VIEW COMPARISON:  None Available. FINDINGS: Flexion deformity at the fifth PIP joint, cannot exclude dislocation. No acute fracture identified. Mild-to-moderate diffuse interphalangeal joint space narrowing, most pronounced at the fourth DIP joint. Cutaneous irregularity of the tip of the first through third digits. Punctate radiodensities projecting over the distal second and fourth digits, near the level of the nailbed. IMPRESSION: 1. Flexion deformity at the fifth PIP joint, cannot exclude dislocation. No acute fracture identified. 2. Cutaneous irregularity of the tip of the first through third digits. Punctate radiodensities projecting over the distal second and fourth digits, near the level of the nailbed. Radiopaque foreign body is not excluded. Electronically Signed   By: Harrietta Sherry M.D.   On: 09/21/2023 12:55   Left hand PIP joint deformity  left small finger   DG Elbow Complete Left Result Date: 09/21/2023 CLINICAL DATA:  Pain after fall. EXAM: LEFT ELBOW - COMPLETE 3+ VIEW COMPARISON:  None Available. FINDINGS: There is no evidence of acute fracture,  dislocation, or sizable joint effusion. There is no evidence of significant arthropathy. Soft tissues are unremarkable. IMPRESSION: No acute fracture or dislocation of the left elbow. Electronically Signed   By: Harrietta Sherry M.D.   On: 09/21/2023 12:34   Left elbow no fracture   DG Chest 1 View Result Date: 09/21/2023 CLINICAL DATA:  Fall. EXAM: CHEST  1 VIEW COMPARISON:  06/09/2023. FINDINGS: The heart size and mediastinal contours are unchanged. Bilateral interstitial coarsening, could reflect edema or an infectious/inflammatory process. No focal consolidation, pleural effusion, or pneumothorax. Diffuse osseous demineralization. No acute osseous abnormality. IMPRESSION: Bilateral interstitial coarsening could reflect edema or an infectious/inflammatory process. Electronically Signed   By: Harrietta Sherry M.D.   On: 09/21/2023 12:33   CT Cervical Spine Wo Contrast Result Date: 09/21/2023 CLINICAL DATA:  83 year old male status post unwitnessed fall. Left head laceration. Pain. EXAM: CT CERVICAL SPINE WITHOUT CONTRAST TECHNIQUE: Multidetector CT imaging of the cervical spine was performed without intravenous contrast. Multiplanar CT image reconstructions were also generated. RADIATION DOSE REDUCTION: This exam was performed according to  the departmental dose-optimization program which includes automated exposure control, adjustment of the mA and/or kV according to patient size and/or use of iterative reconstruction technique. COMPARISON:  Head CT today.  Cervical spine MRI 03/18/2021. FINDINGS: Alignment: Straightening of cervical lordosis appears to be chronic, stable. Mild dextroconvex cervical scoliosis. Cervicothoracic junction alignment is within normal limits. Bilateral posterior element alignment is within normal limits. 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 Visualized skull base is intact. No atlanto-occipital dissociation.  Maintained C1-C2 alignment. Other cervical vertebrae appear intact. No acute osseous abnormality identified. Soft tissues and spinal canal: No prevertebral fluid or swelling. No visible canal hematoma. Mild for age calcified cervical carotid atherosclerosis. Disc levels: Chronic cervical spine degeneration, and pronounced chronic ligamentous hypertrophy at C1-C2 in the setting of ununited odontoid fracture or os odontoideum. Chronic C1 level cervical spinal stenosis, probably stable from 2023 MRI. Ordinary chronic disc and endplate degeneration elsewhere in the cervical spine. Up to mild additional spinal stenosis at those levels. Upper chest: Visible upper thoracic levels appear intact. Negative visible noncontrast thoracic inlet. IMPRESSION: 1. No acute traumatic injury identified in the cervical spine. 2. Chronic odontoid fragment and associated chronic severe C1-C2 spinal stenosis from ligamentous hypertrophy. Similar appearance to a 2023 MRI. Electronically Signed   By: VEAR Hurst M.D.   On: 09/21/2023 12:16    CT cervical spine no fracture degenerative disc disease   CT Head Wo Contrast Result Date: 09/21/2023 CLINICAL DATA:  83 year old male status post unwitnessed fall. Left head laceration. Pain. EXAM: CT HEAD WITHOUT CONTRAST TECHNIQUE: Contiguous axial images were obtained from the base of the skull through the vertex without intravenous contrast. RADIATION DOSE REDUCTION: This exam was performed according to the departmental dose-optimization program which includes automated exposure control, adjustment of the mA and/or kV according to patient size and/or use of iterative reconstruction technique. COMPARISON:  Brain MRI,Head CT 03/17/2021. FINDINGS: Brain: Stable cerebral volume. Chronic encephalomalacia posterior right MCA territory. New but circumscribed and chronic appearing lacunar infarct anterior right external capsule, anterior right basal ganglia. No midline shift, ventriculomegaly, mass  effect, evidence of mass lesion, intracranial hemorrhage or evidence of cortically based acute infarction. Vascular: Calcified atherosclerosis at the skull base. No suspicious intracranial vascular hyperdensity. Skull: Appears stable and intact. No acute osseous abnormality identified. Sinuses/Orbits: Visualized paranasal sinuses and mastoids are stable and well aerated. Other: No discrete orbit or scalp soft tissue injury identified. IMPRESSION: 1. No acute intracranial abnormality or acute traumatic injury identified. 2. Chronic posterior right MCA territory infarct. And chronic but new since 2023 lacunar infarct at the anterior right external capsule. Electronically Signed   By: VEAR Hurst M.D.   On: 09/21/2023 12:12   Family History Reviewed and non-contributory, no pertinent history of problems with bleeding or anesthesia    ROS  Cannot assess due to dementia, the patient's son says the patient has been recently weak and unable to ambulate associated or determine at present to be secondary to weather changes causing the patient to be less active.   OBJECTIVE  Vitals:Patient Vitals for the past 8 hrs:  BP Temp Temp src Pulse Resp SpO2 Height Weight  09/21/23 1050 -- 97.7 F (36.5 C) Oral -- 20 -- 5' 9 (1.753 m) 69.4 kg  09/21/23 1045 (!) 172/86 -- -- 89 -- 95 % -- --   General: Alert, no acute distress Cardiovascular: Warm extremities noted Respiratory: No cyanosis, no use of accessory musculature GI: No organomegaly, abdomen is soft  and non-tender Skin: No lesions in the area of chief complaint other than those listed below in MSK exam.  Neurologic: Sensation intact distally save for the below mentioned MSK exam Psychiatric: Patient is not competent for consent however normal mood and affect Lymphatic: No swelling obvious and reported other than the area involved in the exam below   Extremities  Normal findings of the extremities include no lacerations of the skin no erythema, no  tenderness, functional range of motion, no joint subluxations, normal muscle tone with the following exceptions Left hip skin normal, tenderness left proximal hip, no range of motion at the hip, external rotation shortening deformity, muscle tone normal.   Labs cbc Recent Labs    09/20/23 0713 09/21/23 1102  WBC 6.9 8.8  HGB 14.4 15.8  HCT 42.6 46.9  PLT 258 294   Hemoglobin 14 Platelet count 258   Labs inflam No results for input(s): CRP in the last 72 hours.  Invalid input(s): ESR  Labs coag Recent Labs    09/21/23 1102  INR 0.9   INR 0.9  Recent Labs    09/20/23 0713 09/21/23 1102  NA 139 138  K 3.7 4.1  CL 104 102  CO2 23 23  GLUCOSE 105* 122*  BUN 25* 20  CREATININE 1.65* 1.69*  CALCIUM  9.1 9.5    Potassium 3.7 BUN/creatinine 25 and 1.65

## 2023-09-21 NOTE — ED Triage Notes (Signed)
 BIB [facility] for fall from [facility] for unwitnessed fall this am. Pt has small lac to left side of head, and is c/o left hip pain. His elbow and wrist were wrapped upon arrival. Pt has no hx of diabetes, but does have dementia and extreme hearing loss. CBG by EMS was 159 and VS WNL.

## 2023-09-21 NOTE — Assessment & Plan Note (Addendum)
 LDL of 121 not @ goal of < 70. Consider changing Lipitor 20 mg every day to Crestor  40 mg every day & monitor. Med List includes dx of non compliance; he may not have been taking the statin.

## 2023-09-21 NOTE — ED Provider Notes (Signed)
 [place] EMERGENCY DEPARTMENT AT [facility] Provider Note   CSN: [id number] Arrival date & time: 09/21/23  1028     Patient presents with: Jerry Rodriguez is a 83 y.o. male.  He has a history of dementia, stroke, CKD.  Had an unwitnessed fall at his facility today.  Level 5 caveat secondary to dementia and very hard of hearing.  Patient unable to give any significant history.   The history is provided by the patient and the EMS personnel.  Fall This is a new problem. The problem has not changed since onset.      Prior to Admission medications   Medication Sig Start Date End Date Taking? Authorizing Provider  amLODipine  (NORVASC ) 5 MG tablet Take 1 tablet (5 mg total) by mouth daily. 06/12/23   Pearlean Manus, MD  aspirin  81 MG tablet Take 1 tablet (81 mg total) by mouth daily with breakfast. 06/12/23   Emokpae, Courage, MD  atorvastatin  (LIPITOR) 20 MG tablet Take 20 mg by mouth daily. 02/15/15   [provider]  cholecalciferol (VITAMIN D3) 25 MCG (1000 UNIT) tablet Take 2,000 Units by mouth daily.    [provider]  nicotine  (NICODERM CQ  - DOSED IN MG/24 HOURS) 21 mg/24hr patch Place 1 patch (21 mg total) onto the skin daily. 06/13/23   Pearlean Manus, MD  ramipril  (ALTACE ) 5 MG capsule Take 1 capsule by mouth daily. 06/09/20   [provider]    Allergies: Patient has no known allergies.    Review of Systems  Unable to perform ROS: Dementia    Updated Vital Signs BP (!) 172/86   Pulse 89   Temp 97.7 F (36.5 C) (Oral)   Resp 20   Ht 5' 9 (1.753 m)   Wt 69.4 kg   SpO2 95%   BMI 22.59 kg/m   Physical Exam Vitals and nursing note reviewed.  Constitutional:      General: He is not in acute distress.    Appearance: Normal appearance. He is well-developed.  HENT:     Head: Normocephalic and atraumatic.  Eyes:     Conjunctiva/sclera: Conjunctivae normal.  Cardiovascular:     Rate and Rhythm: Normal rate and  regular rhythm.     Heart sounds: No murmur heard. Pulmonary:     Effort: Pulmonary effort is normal. No respiratory distress.     Breath sounds: Normal breath sounds.  Abdominal:     Palpations: Abdomen is soft.     Tenderness: There is no abdominal tenderness. There is no guarding or rebound.  Musculoskeletal:        General: Tenderness present. No deformity.     Cervical back: Neck supple.     Comments: Patient has some bruising and abrasions left wrist left palm.  Has a skin tear over his left elbow.  Full range of motion without any significant pain.  Right upper extremity full range of motion without any pain or limitations.  Right lower extremity no obvious deformity full range of motion without any pain or limitations.  Left lower extremity tenderness around right hip and pain with any attempted range of motion.  Left knee and ankle appear nontender and distal pulses motor and sensation intact.  Skin:    General: Skin is warm and dry.     Capillary Refill: Capillary refill takes less than 2 seconds.  Neurological:     General: No focal deficit present.     Mental Status: He is alert.  Motor: No weakness.  Psychiatric:        Mood and Affect: Mood normal.     (all labs ordered are listed, but only abnormal results are displayed) Labs Reviewed  BASIC METABOLIC PANEL WITH GFR - Abnormal; Notable for the following components:      Result Value   Glucose, Bld 122 (*)    Creatinine, Ser 1.69 (*)    GFR, Estimated 40 (*)    All other components within normal limits  CBC WITH DIFFERENTIAL/PLATELET  PROTIME-INR  MAGNESIUM  COMPREHENSIVE METABOLIC PANEL WITH GFR  CBC    EKG: EKG Interpretation Date/Time:  Friday September 21 2023 14:29:33 EDT Ventricular Rate:  89 PR Interval:  205 QRS Duration:  102 QT Interval:  359 QTC Calculation: 437 R Axis:   7  Text Interpretation: Sinus rhythm Probable left atrial enlargement COPY Confirmed by Towana Sharper [phone]) on  09/21/2023 2:39:25 PM  Radiology: DG Hip Unilat With Pelvis 2-3 Views Left Result Date: 09/21/2023 CLINICAL DATA:  Pain after fall. EXAM: DG HIP (WITH OR WITHOUT PELVIS) 2-3V LEFT COMPARISON:  CT abdomen/pelvis dated 06/09/2023. FINDINGS: Subcapital fracture of the left femoral head and neck junction with angulation and foreshortening. The left femoral head is seated in the acetabulum. Mild-to-moderate osteoarthritis of the bilateral hips. Sacroiliac joints and pubic symphysis appear anatomically aligned. IMPRESSION: Subcapital fracture of the left femoral head and neck junction with angulation and foreshortening. Electronically Signed   By: Harrietta Sherry M.D.   On: 09/21/2023 12:58   DG Hand Complete Left Result Date: 09/21/2023 CLINICAL DATA:  Pain after fall. EXAM: LEFT HAND - COMPLETE 3+ VIEW COMPARISON:  None Available. FINDINGS: Flexion deformity at the fifth PIP joint, cannot exclude dislocation. No acute fracture identified. Mild-to-moderate diffuse interphalangeal joint space narrowing, most pronounced at the fourth DIP joint. Cutaneous irregularity of the tip of the first through third digits. Punctate radiodensities projecting over the distal second and fourth digits, near the level of the nailbed. IMPRESSION: 1. Flexion deformity at the fifth PIP joint, cannot exclude dislocation. No acute fracture identified. 2. Cutaneous irregularity of the tip of the first through third digits. Punctate radiodensities projecting over the distal second and fourth digits, near the level of the nailbed. Radiopaque foreign body is not excluded. Electronically Signed   By: Harrietta Sherry M.D.   On: 09/21/2023 12:55   DG Elbow Complete Left Result Date: 09/21/2023 CLINICAL DATA:  Pain after fall. EXAM: LEFT ELBOW - COMPLETE 3+ VIEW COMPARISON:  None Available. FINDINGS: There is no evidence of acute fracture, dislocation, or sizable joint effusion. There is no evidence of significant arthropathy. Soft tissues  are unremarkable. IMPRESSION: No acute fracture or dislocation of the left elbow. Electronically Signed   By: Harrietta Sherry M.D.   On: 09/21/2023 12:34   DG Chest 1 View Result Date: 09/21/2023 CLINICAL DATA:  Fall. EXAM: CHEST  1 VIEW COMPARISON:  06/09/2023. FINDINGS: The heart size and mediastinal contours are unchanged. Bilateral interstitial coarsening, could reflect edema or an infectious/inflammatory process. No focal consolidation, pleural effusion, or pneumothorax. Diffuse osseous demineralization. No acute osseous abnormality. IMPRESSION: Bilateral interstitial coarsening could reflect edema or an infectious/inflammatory process. Electronically Signed   By: Harrietta Sherry M.D.   On: 09/21/2023 12:33   CT Cervical Spine Wo Contrast Result Date: 09/21/2023 CLINICAL DATA:  83 year old male status post unwitnessed fall. Left head laceration. Pain. EXAM: CT CERVICAL SPINE WITHOUT CONTRAST TECHNIQUE: Multidetector CT imaging of the cervical spine was performed without intravenous contrast. Multiplanar  CT image reconstructions were also generated. RADIATION DOSE REDUCTION: This exam was performed according to the departmental dose-optimization program which includes automated exposure control, adjustment of the mA and/or kV according to patient size and/or use of iterative reconstruction technique. COMPARISON:  Head CT today.  Cervical spine MRI 03/18/2021. FINDINGS: Alignment: Straightening of cervical lordosis appears to be chronic, stable. Mild dextroconvex cervical scoliosis. Cervicothoracic junction alignment is within normal limits. Bilateral posterior element alignment is within normal limits. 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 Visualized skull base is intact. No atlanto-occipital dissociation. Maintained C1-C2 alignment. Other cervical vertebrae appear intact. No acute osseous abnormality identified. Soft tissues and spinal canal:  No prevertebral fluid or swelling. No visible canal hematoma. Mild for age calcified cervical carotid atherosclerosis. Disc levels: Chronic cervical spine degeneration, and pronounced chronic ligamentous hypertrophy at C1-C2 in the setting of ununited odontoid fracture or os odontoideum. Chronic C1 level cervical spinal stenosis, probably stable from 2023 MRI. Ordinary chronic disc and endplate degeneration elsewhere in the cervical spine. Up to mild additional spinal stenosis at those levels. Upper chest: Visible upper thoracic levels appear intact. Negative visible noncontrast thoracic inlet. IMPRESSION: 1. No acute traumatic injury identified in the cervical spine. 2. Chronic odontoid fragment and associated chronic severe C1-C2 spinal stenosis from ligamentous hypertrophy. Similar appearance to a 2023 MRI. Electronically Signed   By: VEAR Hurst M.D.   On: 09/21/2023 12:16   CT Head Wo Contrast Result Date: 09/21/2023 CLINICAL DATA:  83 year old male status post unwitnessed fall. Left head laceration. Pain. EXAM: CT HEAD WITHOUT CONTRAST TECHNIQUE: Contiguous axial images were obtained from the base of the skull through the vertex without intravenous contrast. RADIATION DOSE REDUCTION: This exam was performed according to the departmental dose-optimization program which includes automated exposure control, adjustment of the mA and/or kV according to patient size and/or use of iterative reconstruction technique. COMPARISON:  Brain MRI,Head CT 03/17/2021. FINDINGS: Brain: Stable cerebral volume. Chronic encephalomalacia posterior right MCA territory. New but circumscribed and chronic appearing lacunar infarct anterior right external capsule, anterior right basal ganglia. No midline shift, ventriculomegaly, mass effect, evidence of mass lesion, intracranial hemorrhage or evidence of cortically based acute infarction. Vascular: Calcified atherosclerosis at the skull base. No suspicious intracranial vascular  hyperdensity. Skull: Appears stable and intact. No acute osseous abnormality identified. Sinuses/Orbits: Visualized paranasal sinuses and mastoids are stable and well aerated. Other: No discrete orbit or scalp soft tissue injury identified. IMPRESSION: 1. No acute intracranial abnormality or acute traumatic injury identified. 2. Chronic posterior right MCA territory infarct. And chronic but new since 2023 lacunar infarct at the anterior right external capsule. Electronically Signed   By: VEAR Hurst M.D.   On: 09/21/2023 12:12     Procedures   Medications Ordered in the ED  HYDROmorphone  (DILAUDID ) injection 0.5 mg (0.5 mg Intravenous Given 09/21/23 1746)  aspirin  chewable tablet 81 mg (has no administration in time range)  amLODipine  (NORVASC ) tablet 5 mg (has no administration in time range)  atorvastatin  (LIPITOR) tablet 20 mg (has no administration in time range)  ramipril  (ALTACE ) capsule 5 mg (has no administration in time range)  nicotine  (NICODERM CQ  - dosed in mg/24 hours) patch 21 mg (has no administration in time range)  heparin  injection 5,000 Units (has no administration in time range)  acetaminophen  (TYLENOL ) tablet 650 mg (has no administration in time range)    Or  acetaminophen  (TYLENOL ) suppository 650 mg (has no administration in time range)  ondansetron  (  ZOFRAN ) tablet 4 mg (has no administration in time range)    Or  ondansetron  (ZOFRAN ) injection 4 mg (has no administration in time range)  fentaNYL  (SUBLIMAZE ) injection 50 mcg (50 mcg Intravenous Given 09/21/23 1345)  morphine  (PF) 4 MG/ML injection 4 mg (4 mg Intravenous Given 09/21/23 1456)    Clinical Course as of 09/21/23 1755  Fri Sep 21, 2023  1303 Radiology comment about flexion deformity of his left fifth finger.  I have tried to move that area and it appears completely fused.  He says it is not bothering him.  Unclear if this is old or not but does not appear acute [MB]  1321 Discussed with patient's son by phone.   He is okay with proceeding with orthopedic consult here. [MB]  1402 Discussed with Dr. Margrette orthopedics who will see the patient in consult. [MB]  1426 Discussed with [facility] hospitalist Dr. Maree who will evaluate him for admission. [MB]    Clinical Course User Index [MB] Towana Ozell BROCKS, MD                                 Medical Decision Making Amount and/or Complexity of Data Reviewed Labs: ordered. Radiology: ordered.  Risk Prescription drug management. Decision regarding hospitalization.   This patient complains of left hip pain after fall; this involves an extensive number of treatment Options and is a complaint that carries with it a high risk of complications and morbidity. The differential includes fracture, contusion, dislocation, intracranial bleed, metabolic derangement  I ordered, reviewed and interpreted labs, which included CBC unremarkable, chemistries with chronic CKD, INR normal I ordered medication IV pain medicine and reviewed PMP when indicated. I ordered imaging studies which included CT head and cervical spine, x-rays of pelvis and left hip, chest, left hand and elbow and I independently    visualized and interpreted imaging which showed acute subcapital left hip fracture.  Does have contraction deformity left fifth PIP question acuity Additional history obtained from patient's son Previous records obtained and reviewed in epic no recent admissions I consulted orthopedics Dr. Margrette and [facility] hospitalist Dr. Loreli and discussed lab and imaging findings and discussed disposition.  Cardiac monitoring reviewed, sinus rhythm Social determinants considered, tobacco use Critical Interventions: None After the interventions stated above, I reevaluated the patient and found patient's pain to be improved and hemodynamically stable Admission and further testing considered, patient will need admission to the hospital for further orthopedic and medical management.   Son in agreement with plan for admission.      Final diagnoses:  Closed left hip fracture, initial encounter [facility])  Fall, initial encounter    ED Discharge Orders     None          Towana Ozell BROCKS, MD 09/21/23 1758

## 2023-09-21 NOTE — Patient Instructions (Signed)
 See assessment and plan under each diagnosis in the problem list and acutely for this visit

## 2023-09-21 NOTE — Assessment & Plan Note (Signed)
 Nutrition consult & PT/OT @ SNF Speech Therapy will perform MMSE

## 2023-09-21 NOTE — Assessment & Plan Note (Addendum)
 Macrocytosis persists in absence of anemia. B12 will be updated as severe deficiency could be contributing factor to his suggested neurocognitive deficits.

## 2023-09-21 NOTE — Progress Notes (Signed)
 NURSING HOME LOCATION:  [facility] ROOM NUMBER:  [id number] P  CODE STATUS:  Full Code  PCP:  Barnie CANDIE Seip , NP  This is a comprehensive admission note to this SNFperformed on this date less than 30 days from date of admission. Included are preadmission medical/surgical history; reconciled medication list; family history; social history and comprehensive review of systems.  Corrections and additions to the records were documented. Comprehensive physical exam was also performed. Additionally a clinical summary was entered for each active diagnosis pertinent to this admission in the Problem List to enhance continuity of care.  HPI:He was admitted to this facility from home for short term rehab in context of adult failure to thrive syndrome. He had been hospitalized 5/17-5/20/2025 with SIRS/septic shock due to E coli bacteremia.Course was complicated by ARF superimposed on CKD & transaminitis. Lactic acid peaked @ 4.2 & WBC @ 17,800.Peak AST was 327, ALT 204 & alkaline phos 263; final values were 29, 67 & 118.Peak creatinine was 2.21 & final value 1.62. Nadir eGFR was 29 & final value 42, indicating CKD Stage 3B. Protein/caloric malnutrition was suggested by albumin of 2.7 & total protein of 5.9. MCV was 101 without associated anemia. Most recent B12 was 137 on 02/26/2021. Folate was 8.7 same date. Antibiotics were transitioned from IV Zosyn  to Rocephin  to 10 days of Cefadroxyl . An incidental finding was 7 mm RLL nodule for which CT F/U was recommended in 6-12 months. Post discharge he could not adequately complete ADLs, prompting SNF placement for rehab. Labs were updated 8/28: values are essentially stable. Lipid panel reveals LDL  of  121 ,not @ goal of < 70 with PMH of CVA.  Past medical and surgical history:includes dyslipidemia;essential HTN;vitamin D  deficiency;PAD with carotid artery occlusion;COPD; & PMH of CVA.  Family history: reviewed, non contributory due to  advanced age.  Social history:non drinker; 60 pack year smoking history.   Review of systems: Clinical neurocognitive deficits suspect ; he is essentially non verbal & profoundly deaf , compromising ROS completion.   Physical exam:  Pertinent or positive findings: He was initially asleep, exhibiting hypopnea without snoring or apnea. Upon awakening facies were blank & he did not respond verbally to queries. Hair is dissheveled & thin over the crown.He is unshaven & has a moustache.Scattered facial keratoses are present.There is a large hyperpigmented lesion over the mid crown.Lids are puffy.There is faint erythema over his nose without cellulitis.Complete dentures are present.Breath sounds are distant.Rhythm is slightly irregular; S2 is accentuated.Pedal pulses are not palpable.Clubbing of nailbeds suggested.When feet were examined the L great toe extends.There is flexion contracture of  5th R digit. Ecchymoses & scarring of forearms present. As I was trying to complete my interview & exam ; he began to pull the bed coverings back over his entire body.  General appearance:  no acute distress, increased work of breathing is present.   Lymphatic: No lymphadenopathy about the head, neck, axilla. Eyes: No conjunctival inflammation or lid edema is present. There is no scleral icterus. Ears:  External ear exam shows no significant lesions or deformities.   Nose:  External nasal examination shows no deformity or inflammation. Nasal mucosa are pink and moist without lesions, exudates Neck:  No thyromegaly, masses, tenderness noted.    Heart:  No gallop, murmur, click, rub.  Lungs:  without wheezes, rhonchi, rales, rubs. Abdomen: Bowel sounds are normal.  Abdomen is soft and nontender with no organomegaly, hernias, masses. GU: Deferred  Extremities:  No cyanosis, edema. Neurologic exam:  Balance, Rhomberg, finger to nose testing could not be completed due to clinical state Skin: Warm & dry w/o  tenting. No significant rash.  See clinical summary under each active problem in the Problem List with associated updated therapeutic plan

## 2023-09-21 NOTE — Assessment & Plan Note (Signed)
 Current albumin 2.8 & Total Protein 6.6. Nutritionist to assess.

## 2023-09-21 NOTE — ED Notes (Signed)
 Patient does have hx of dementia but he has been A&O this ED visit thus far and so he was able to sign the consent for his son to be able to obtain the CD-Rom with his images.

## 2023-09-21 NOTE — Assessment & Plan Note (Signed)
 Post sepsis syndrome with ARF; current CKD is Stage 3 B. Med List reviewed; no change indicated.

## 2023-09-22 ENCOUNTER — Encounter (HOSPITAL_COMMUNITY)
Admission: EM | Disposition: A | Discharge status: Skilled Nursing Facility | Payer: Self-pay | Source: Skilled Nursing Facility | Attending: Internal Medicine

## 2023-09-22 ENCOUNTER — Inpatient Hospital Stay (HOSPITAL_COMMUNITY): Admitting: Anesthesiology

## 2023-09-22 ENCOUNTER — Inpatient Hospital Stay (HOSPITAL_COMMUNITY)

## 2023-09-22 ENCOUNTER — Encounter (HOSPITAL_COMMUNITY): Payer: Self-pay | Admitting: Internal Medicine

## 2023-09-22 DIAGNOSIS — S72002A Fracture of unspecified part of neck of left femur, initial encounter for closed fracture: Secondary | ICD-10-CM | POA: Diagnosis not present

## 2023-09-22 HISTORY — PX: HIP ARTHROPLASTY: SHX981

## 2023-09-22 LAB — CBC
HCT: 46.1 % (ref 39.0–52.0)
Hemoglobin: 15.8 g/dL (ref 13.0–17.0)
MCH: 33.8 pg (ref 26.0–34.0)
MCHC: 34.3 g/dL (ref 30.0–36.0)
MCV: 98.7 fL (ref 80.0–100.0)
Platelets: 259 K/uL (ref 150–400)
RBC: 4.67 MIL/uL (ref 4.22–5.81)
RDW: 12.6 % (ref 11.5–15.5)
WBC: 10.1 K/uL (ref 4.0–10.5)
nRBC: 0 % (ref 0.0–0.2)

## 2023-09-22 LAB — COMPREHENSIVE METABOLIC PANEL WITH GFR
ALT: 25 U/L (ref 0–44)
AST: 19 U/L (ref 15–41)
Albumin: 3.2 g/dL — ABNORMAL LOW (ref 3.5–5.0)
Alkaline Phosphatase: 108 U/L (ref 38–126)
Anion gap: 12 (ref 5–15)
BUN: 17 mg/dL (ref 8–23)
CO2: 23 mmol/L (ref 22–32)
Calcium: 9.5 mg/dL (ref 8.9–10.3)
Chloride: 103 mmol/L (ref 98–111)
Creatinine, Ser: 1.43 mg/dL — ABNORMAL HIGH (ref 0.61–1.24)
GFR, Estimated: 49 mL/min — ABNORMAL LOW
Glucose, Bld: 116 mg/dL — ABNORMAL HIGH (ref 70–99)
Potassium: 4.2 mmol/L (ref 3.5–5.1)
Sodium: 138 mmol/L (ref 135–145)
Total Bilirubin: 1.4 mg/dL — ABNORMAL HIGH (ref 0.0–1.2)
Total Protein: 7 g/dL (ref 6.5–8.1)

## 2023-09-22 LAB — MAGNESIUM: Magnesium: 2.2 mg/dL (ref 1.7–2.4)

## 2023-09-22 SURGERY — HEMIARTHROPLASTY (BIPOLAR) HIP, POSTERIOR APPROACH FOR FRACTURE
Anesthesia: General | Site: Hip | Laterality: Left

## 2023-09-22 MED ORDER — ONDANSETRON HCL 4 MG/2ML IJ SOLN
4.0000 mg | Freq: Four times a day (QID) | INTRAMUSCULAR | Status: DC | PRN
  Administered 2023-09-23: 4 mg via INTRAVENOUS
  Filled 2023-09-22: qty 2

## 2023-09-22 MED ORDER — OXYCODONE HCL 5 MG PO TABS: 5.0000 mg | ORAL_TABLET | Freq: Once | ORAL | Status: DC | PRN

## 2023-09-22 MED ORDER — PHENYLEPHRINE HCL (PRESSORS) 10 MG/ML IV SOLN
INTRAVENOUS | Status: DC | PRN
  Administered 2023-09-22 (×8): 160 ug via INTRAVENOUS

## 2023-09-22 MED ORDER — CEFAZOLIN SODIUM-DEXTROSE 2-4 GM/100ML-% IV SOLN
INTRAVENOUS | Status: AC
  Filled 2023-09-22: qty 100

## 2023-09-22 MED ORDER — BISACODYL 5 MG PO TBEC: 5.0000 mg | DELAYED_RELEASE_TABLET | Freq: Every day | ORAL | Status: DC | PRN

## 2023-09-22 MED ORDER — CEFAZOLIN SODIUM-DEXTROSE 2-3 GM-%(50ML) IV SOLR
INTRAVENOUS | Status: DC | PRN
Start: 2023-09-22 — End: 2023-09-22
  Administered 2023-09-22: 2 g via INTRAVENOUS

## 2023-09-22 MED ORDER — MORPHINE SULFATE (PF) 2 MG/ML IV SOLN: 0.5000 mg | INTRAVENOUS | Status: DC | PRN

## 2023-09-22 MED ORDER — FLEET ENEMA RE ENEM: 1.0000 | ENEMA | Freq: Once | RECTAL | Status: DC | PRN

## 2023-09-22 MED ORDER — METOCLOPRAMIDE HCL 5 MG/ML IJ SOLN: 5.0000 mg | Freq: Three times a day (TID) | INTRAMUSCULAR | Status: DC | PRN

## 2023-09-22 MED ORDER — PROPOFOL 10 MG/ML IV BOLUS
INTRAVENOUS | Status: DC | PRN
  Administered 2023-09-22: 60 mg via INTRAVENOUS

## 2023-09-22 MED ORDER — METOCLOPRAMIDE HCL 10 MG PO TABS: 5.0000 mg | ORAL_TABLET | Freq: Three times a day (TID) | ORAL | Status: DC | PRN

## 2023-09-22 MED ORDER — TRANEXAMIC ACID-NACL 1000-0.7 MG/100ML-% IV SOLN
INTRAVENOUS | Status: DC | PRN
  Administered 2023-09-22: 1000 mg via INTRAVENOUS

## 2023-09-22 MED ORDER — CEFAZOLIN SODIUM-DEXTROSE 2-4 GM/100ML-% IV SOLN
2.0000 g | Freq: Four times a day (QID) | INTRAVENOUS | Status: AC
  Administered 2023-09-22 (×2): 2 g via INTRAVENOUS
  Filled 2023-09-22 (×2): qty 100

## 2023-09-22 MED ORDER — ENOXAPARIN SODIUM 40 MG/0.4ML IJ SOSY
40.0000 mg | PREFILLED_SYRINGE | INTRAMUSCULAR | Status: DC
  Administered 2023-09-23 – 2023-09-25 (×3): 40 mg via SUBCUTANEOUS
  Filled 2023-09-22 (×3): qty 0.4

## 2023-09-22 MED ORDER — ACETAMINOPHEN 10 MG/ML IV SOLN
500.0000 mg | Freq: Four times a day (QID) | INTRAVENOUS | Status: AC
  Administered 2023-09-22 – 2023-09-23 (×3): 500 mg via INTRAVENOUS
  Filled 2023-09-22 (×7): qty 50

## 2023-09-22 MED ORDER — SODIUM CHLORIDE 0.9 % IV SOLN: INTRAVENOUS | Status: AC

## 2023-09-22 MED ORDER — METHOCARBAMOL 500 MG PO TABS: 500.0000 mg | ORAL_TABLET | Freq: Four times a day (QID) | ORAL | Status: DC | PRN

## 2023-09-22 MED ORDER — OXYCODONE HCL 5 MG/5ML PO SOLN: 5.0000 mg | Freq: Once | ORAL | Status: DC | PRN

## 2023-09-22 MED ORDER — ONDANSETRON HCL 4 MG/2ML IJ SOLN: 4.0000 mg | Freq: Once | INTRAMUSCULAR | Status: DC | PRN

## 2023-09-22 MED ORDER — FENTANYL CITRATE (PF) 100 MCG/2ML IJ SOLN
INTRAMUSCULAR | Status: AC
  Filled 2023-09-22: qty 2

## 2023-09-22 MED ORDER — TRANEXAMIC ACID-NACL 1000-0.7 MG/100ML-% IV SOLN
INTRAVENOUS | Status: AC
  Filled 2023-09-22: qty 100

## 2023-09-22 MED ORDER — 0.9 % SODIUM CHLORIDE (POUR BTL) OPTIME
TOPICAL | Status: DC | PRN
  Administered 2023-09-22: 1000 mL

## 2023-09-22 MED ORDER — PROPOFOL 10 MG/ML IV BOLUS
INTRAVENOUS | Status: AC
Start: 2023-09-22 — End: 2023-09-22
  Filled 2023-09-22: qty 20

## 2023-09-22 MED ORDER — POLYETHYLENE GLYCOL 3350 17 G PO PACK
17.0000 g | PACK | Freq: Every day | ORAL | Status: DC | PRN
Start: 2023-09-22 — End: 2023-09-25
  Administered 2023-09-24: 17 g via ORAL
  Filled 2023-09-22: qty 1

## 2023-09-22 MED ORDER — FENTANYL CITRATE (PF) 100 MCG/2ML IJ SOLN
INTRAMUSCULAR | Status: DC | PRN
  Administered 2023-09-22 (×2): 50 ug via INTRAVENOUS

## 2023-09-22 MED ORDER — HYDROCODONE-ACETAMINOPHEN 5-325 MG PO TABS
1.0000 | ORAL_TABLET | ORAL | Status: DC | PRN
  Administered 2023-09-23 (×2): 1 via ORAL
  Filled 2023-09-22 (×2): qty 1

## 2023-09-22 MED ORDER — FENTANYL CITRATE PF 50 MCG/ML IJ SOSY: 25.0000 ug | PREFILLED_SYRINGE | INTRAMUSCULAR | Status: DC | PRN

## 2023-09-22 MED ORDER — ONDANSETRON HCL 4 MG PO TABS: 4.0000 mg | ORAL_TABLET | Freq: Four times a day (QID) | ORAL | Status: DC | PRN

## 2023-09-22 MED ORDER — LACTATED RINGERS IV SOLN
INTRAVENOUS | Status: DC | PRN
Start: 2023-09-22 — End: 2023-09-22

## 2023-09-22 MED ORDER — TRAMADOL HCL 50 MG PO TABS
50.0000 mg | ORAL_TABLET | Freq: Four times a day (QID) | ORAL | Status: DC
  Administered 2023-09-22 – 2023-09-25 (×9): 50 mg via ORAL
  Filled 2023-09-22 (×11): qty 1

## 2023-09-22 MED ORDER — BUPIVACAINE-EPINEPHRINE (PF) 0.5% -1:200000 IJ SOLN
INTRAMUSCULAR | Status: DC | PRN
  Administered 2023-09-22: 30 mL via PERINEURAL

## 2023-09-22 MED ORDER — BUPIVACAINE-EPINEPHRINE (PF) 0.5% -1:200000 IJ SOLN
INTRAMUSCULAR | Status: AC
  Filled 2023-09-22: qty 30

## 2023-09-22 MED ORDER — PHENOL 1.4 % MT LIQD
1.0000 | OROMUCOSAL | Status: DC | PRN
Start: 2023-09-22 — End: 2023-09-25

## 2023-09-22 MED ORDER — HYDROCODONE-ACETAMINOPHEN 7.5-325 MG PO TABS: 1.0000 | ORAL_TABLET | ORAL | Status: DC | PRN

## 2023-09-22 MED ORDER — MENTHOL 3 MG MT LOZG
1.0000 | LOZENGE | OROMUCOSAL | Status: DC | PRN
Start: 2023-09-22 — End: 2023-09-25

## 2023-09-22 MED ORDER — EPHEDRINE SULFATE (PRESSORS) 50 MG/ML IJ SOLN
INTRAMUSCULAR | Status: DC | PRN
  Administered 2023-09-22 (×5): 10 mg via INTRAVENOUS

## 2023-09-22 MED ORDER — TRANEXAMIC ACID-NACL 1000-0.7 MG/100ML-% IV SOLN
1000.0000 mg | Freq: Once | INTRAVENOUS | Status: AC
  Administered 2023-09-22: 1000 mg via INTRAVENOUS
  Filled 2023-09-22: qty 100

## 2023-09-22 MED ORDER — ACETAMINOPHEN 325 MG PO TABS: 325.0000 mg | ORAL_TABLET | Freq: Four times a day (QID) | ORAL | Status: DC | PRN

## 2023-09-22 MED ORDER — SODIUM CHLORIDE 0.9 % IR SOLN
Status: DC | PRN
  Administered 2023-09-22: 3000 mL

## 2023-09-22 MED ORDER — DOCUSATE SODIUM 100 MG PO CAPS
100.0000 mg | ORAL_CAPSULE | Freq: Two times a day (BID) | ORAL | Status: DC
  Administered 2023-09-22 – 2023-09-25 (×6): 100 mg via ORAL
  Filled 2023-09-22 (×6): qty 1

## 2023-09-22 MED ORDER — METHOCARBAMOL 1000 MG/10ML IJ SOLN: 500.0000 mg | Freq: Four times a day (QID) | INTRAMUSCULAR | Status: DC | PRN

## 2023-09-22 SURGICAL SUPPLY — 51 items
ANCHOR SUT MAYO CAT SZ1 1/2 CI (Anchor) IMPLANT
BIT DRILL 2.8X128 (BIT) ×1 IMPLANT
BLADE SAGITTAL 25.0X1.27X90 (BLADE) ×1 IMPLANT
CEMENT BONE 10-PACK (Cement) IMPLANT
CHLORAPREP W/TINT 26 (MISCELLANEOUS) ×1 IMPLANT
CLOTH BEACON ORANGE TIMEOUT ST (SAFETY) ×1 IMPLANT
COUNTER NDL MAGNETIC 40 RED (SET/KITS/TRAYS/PACK) ×1 IMPLANT
COUNTER NEEDLE MAGNETIC 40 RED (SET/KITS/TRAYS/PACK) ×1 IMPLANT
COVER LIGHT HANDLE (MISCELLANEOUS) IMPLANT
DECANTER SPIKE VIAL GLASS SM (MISCELLANEOUS) ×1 IMPLANT
DRAPE HIP W/POCKET STRL (MISCELLANEOUS) ×1 IMPLANT
DRAPE U-SHAPE 47X51 STRL (DRAPES) ×1 IMPLANT
DRESSING AQUACEL AG ADV 3.5X12 (MISCELLANEOUS) ×1 IMPLANT
DRSG MEPILEX SACRM 8.7X9.8 (GAUZE/BANDAGES/DRESSINGS) ×1 IMPLANT
ELECTRODE REM PT RTRN 9FT ADLT (ELECTROSURGICAL) ×1 IMPLANT
GLOVE BIOGEL PI IND STRL 7.0 (GLOVE) ×2 IMPLANT
GLOVE BIOGEL PI IND STRL 8.5 (GLOVE) ×1 IMPLANT
GLOVE SKINSENSE STRL SZ8.0 LF (GLOVE) ×2 IMPLANT
GOWN STRL REUS W/TWL LRG LVL3 (GOWN DISPOSABLE) ×2 IMPLANT
GOWN STRL REUS W/TWL XL LVL3 (GOWN DISPOSABLE) ×1 IMPLANT
HEAD BIPOLAR PROS AML 52 (Hips) IMPLANT
HEAD FEM STD 28X+1.5 STRL (Hips) IMPLANT
INST SET MAJOR BONE (KITS) ×1 IMPLANT
KIT TURNOVER KIT A (KITS) ×1 IMPLANT
MANIFOLD NEPTUNE II (INSTRUMENTS) ×1 IMPLANT
MARKER SKIN DUAL TIP RULER LAB (MISCELLANEOUS) ×1 IMPLANT
NDL HYPO 18GX1.5 BLUNT FILL (NEEDLE) ×1 IMPLANT
NDL HYPO 21X1.5 SAFETY (NEEDLE) ×1 IMPLANT
NEEDLE HYPO 18GX1.5 BLUNT FILL (NEEDLE) ×1 IMPLANT
NEEDLE HYPO 21X1.5 SAFETY (NEEDLE) ×1 IMPLANT
NS IRRIG 1000ML POUR BTL (IV SOLUTION) ×1 IMPLANT
PACK TOTAL JOINT (CUSTOM PROCEDURE TRAY) ×1 IMPLANT
PAD ARMBOARD POSITIONER FOAM (MISCELLANEOUS) ×1 IMPLANT
PASSER SUT SWANSON 36MM LOOP (INSTRUMENTS) IMPLANT
PIN STMN SNGL STERILE 9X3.6MM (PIN) ×2 IMPLANT
POSITIONER HEAD 8X9X4 ADT (SOFTGOODS) ×1 IMPLANT
SET BASIN LINEN APH (SET/KITS/TRAYS/PACK) ×1 IMPLANT
SET HNDPC FAN SPRY TIP SCT (DISPOSABLE) ×1 IMPLANT
SPONGE T-LAP 18X18 ~~LOC~~+RFID (SPONGE) IMPLANT
STAPLER SKIN PROX WIDE 3.9 (STAPLE) ×1 IMPLANT
STEM FEMORAL SZ 5MM STD ACTIS (Stem) IMPLANT
SUT BRALON NAB BRD #1 30IN (SUTURE) ×2 IMPLANT
SUT ETHIBOND 5 LR DA (SUTURE) ×2 IMPLANT
SUT MNCRL 0 VIOLET CTX 36 (SUTURE) ×1 IMPLANT
SUT MON AB 0 CT1 (SUTURE) ×1 IMPLANT
SUT VIC AB 1 CT1 27XBRD ANTBC (SUTURE) ×4 IMPLANT
SYR 30ML LL (SYRINGE) ×1 IMPLANT
SYR BULB IRRIG 60ML STRL (SYRINGE) ×1 IMPLANT
TRAY FOLEY MTR SLVR 16FR STAT (SET/KITS/TRAYS/PACK) ×1 IMPLANT
WATER STERILE IRR 1000ML POUR (IV SOLUTION) ×2 IMPLANT
YANKAUER SUCT 12FT TUBE ARGYLE (SUCTIONS) ×1 IMPLANT

## 2023-09-22 NOTE — Anesthesia Preprocedure Evaluation (Signed)
 Anesthesia Evaluation  Patient identified by MRN, date of birth, ID band Patient awake    Reviewed: Allergy & Precautions, H&P , NPO status , Patient's Chart, lab work & pertinent test results, reviewed documented beta blocker date and time   Airway Mallampati: II  TM Distance: >3 FB Neck ROM: full    Dental no notable dental hx.    Pulmonary neg pulmonary ROS, COPD, Current Smoker   Pulmonary exam normal breath sounds clear to auscultation       Cardiovascular Exercise Tolerance: Good hypertension, + Peripheral Vascular Disease  negative cardio ROS  Rhythm:regular Rate:Normal     Neuro/Psych CVA negative neurological ROS  negative psych ROS   GI/Hepatic negative GI ROS, Neg liver ROS,,,  Endo/Other  negative endocrine ROS    Renal/GU Renal diseasenegative Renal ROS  negative genitourinary   Musculoskeletal   Abdominal   Peds  Hematology negative hematology ROS (+)   Anesthesia Other Findings   Reproductive/Obstetrics negative OB ROS                              Anesthesia Physical Anesthesia Plan  ASA: 3  Anesthesia Plan: General and General LMA   Post-op Pain Management:    Induction:   PONV Risk Score and Plan: Ondansetron   Airway Management Planned:   Additional Equipment:   Intra-op Plan:   Post-operative Plan:   Informed Consent: I have reviewed the patients History and Physical, chart, labs and discussed the procedure including the risks, benefits and alternatives for the proposed anesthesia with the patient or authorized representative who has indicated his/her understanding and acceptance.     Dental Advisory Given  Plan Discussed with: CRNA  Anesthesia Plan Comments:         Anesthesia Quick Evaluation

## 2023-09-22 NOTE — Transfer of Care (Signed)
 Immediate Anesthesia Transfer of Care Note  Patient: Jerry Rodriguez  Procedure(s) Performed: HEMIARTHROPLASTY (BIPOLAR) HIP, POSTERIOR APPROACH FOR FRACTURE (Left: Hip)  Patient Location: PACU  Anesthesia Type:General  Level of Consciousness: awake and drowsy  Airway & Oxygen Therapy: Patient Spontanous Breathing  Post-op Assessment: Report given to RN and Post -op Vital signs reviewed and stable  Post vital signs: Reviewed and stable  Last Vitals:  Vitals Value Taken Time  BP 107/90   Temp 97.6   Pulse 103   Resp 19   SpO2 95     Last Pain:  Vitals:   09/22/23 0547  TempSrc:   PainSc: Asleep         Complications: No notable events documented.

## 2023-09-22 NOTE — Anesthesia Postprocedure Evaluation (Signed)
 Anesthesia Post Note  Patient: ASSER LUCENA  Procedure(s) Performed: HEMIARTHROPLASTY (BIPOLAR) HIP, POSTERIOR APPROACH FOR FRACTURE (Left: Hip)  Patient location during evaluation: PACU Anesthesia Type: General Level of consciousness: awake and alert Pain management: pain level controlled Vital Signs Assessment: post-procedure vital signs reviewed and stable Respiratory status: spontaneous breathing, nonlabored ventilation, respiratory function stable and patient connected to nasal cannula oxygen Cardiovascular status: blood pressure returned to baseline and stable Postop Assessment: no apparent nausea or vomiting Anesthetic complications: no   No notable events documented.   Last Vitals:  Vitals:   09/21/23 2319 09/22/23 0341  BP: (!) 108/92 (!) 150/81  Pulse: 93 97  Resp: 20 (!) 22  Temp: 36.7 C (!) 36.3 C  SpO2: 96% 97%    Last Pain:  Vitals:   09/22/23 0547  TempSrc:   PainSc: Asleep                 Yvonna JINNY Bosworth

## 2023-09-22 NOTE — Progress Notes (Signed)
 Received patient from PACU, patient is alert and family at the bedside. Left hip dressing is C/D/I, placed SCD's, call bell and tray table within reach. Vital signs WNL.

## 2023-09-22 NOTE — Plan of Care (Signed)
  Problem: Education: Goal: Knowledge of General Education information will improve Description: Including pain rating scale, medication(s)/side effects and non-pharmacologic comfort measures Outcome: Progressing   Problem: Clinical Measurements: Goal: Will remain free from infection Outcome: Progressing   Problem: Safety: Goal: Ability to remain free from injury will improve Outcome: Progressing   

## 2023-09-22 NOTE — Progress Notes (Signed)
 PROGRESS NOTE    Jerry Rodriguez  FMW:[id number] DOB: [birth year] DOA: 09/21/2023 PCP: Landy Barnie RAMAN, NP   Brief Narrative:    Jerry Rodriguez is a 83 y.o. male with medical history significant for hypertension, CKD stage IIIa, mixed hyperlipidemia, dementia, history of CVA, moderate cognitive impairment, moderate protein calorie malnutrition, aortic atherosclerosis, tobacco abuse, and hearing difficulty who presented from his SNF after an unwitnessed fall on the morning of admission.  He was noted to have a left subcapital hip fracture and has been admitted for operative repair.  Assessment & Plan:   Principal Problem:   Hip fracture (HCC) Active Problems:   Essential hypertension   Mixed hyperlipidemia   Tobacco abuse   History of CVA (cerebrovascular accident)   Benign hypertension with CKD (chronic kidney disease) stage III (HCC)   Moderate cognitive impairment   Moderate protein malnutrition (HCC)   Adult failure to thrive syndrome  Assessment and Plan:   Left subcapital hip fracture status post fall - Fall precautions - Appreciate orthopedic evaluation with plan for operative repair today - He has remained n.p.o. after midnight.   Hypertension - Continue lisinopril and Norvasc    CKD stage IIIa - Continue to monitor creatinine levels which appear to be near baseline   History of CVA with dyslipidemia/aortic atherosclerosis - Continue aspirin  and Lipitor   Moderate cognitive impairment/severe hearing loss - Difficult to communicate   Moderate protein calorie malnutrition - Noted to be started on Pro-Stat twice daily   Tobacco abuse - Noted to be on NicoDerm patch 21 mg daily, continue same    DVT prophylaxis: Heparin  Code Status: DNR Family Communication: Jerry, daughter, and wife at bedside 8/30 Disposition Plan:  Status is: Inpatient Remains inpatient appropriate because: Need for inpatient operation and IV medications.  Consultants:   Orthopedics-Dr. Margrette  Procedures:  None  Antimicrobials:  None   Subjective: Patient seen and evaluated today with no new acute complaints or concerns. No acute concerns or events noted overnight.  Multiple family members at bedside.  No significant pain concerns noted.  Objective: Vitals:   09/21/23 1519 09/21/23 2003 09/21/23 2319 09/22/23 0341  BP: (!) 163/80 125/89 (!) 108/92 (!) 150/81  Pulse: (!) 101 99 93 97  Resp: 19 18 20  (!) 22  Temp: (!) 97.5 F (36.4 C) 98.6 F (37 C) 98 F (36.7 C) (!) 97.4 F (36.3 C)  TempSrc: Axillary Oral Oral Oral  SpO2: 97% 98% 96% 97%  Weight: 69.3 kg     Height: 5' 9 (1.753 m)       Intake/Output Summary (Last 24 hours) at 09/22/2023 0919 Last data filed at 09/22/2023 0341 Gross per 24 hour  Intake 0 ml  Output --  Net 0 ml   Filed Weights   09/21/23 1050 09/21/23 1519  Weight: 69.4 kg 69.3 kg    Examination:  General exam: Appears calm and comfortable, hard of hearing Respiratory system: Clear to auscultation. Respiratory effort normal. Cardiovascular system: S1 & S2 heard, RRR.  Gastrointestinal system: Abdomen is soft Central nervous system: Alert and awake Extremities: No edema Skin: No significant lesions noted Psychiatry: Flat affect.    Data Reviewed: I have personally reviewed following labs and imaging studies  CBC: Recent Labs  Lab 09/20/23 0713 09/21/23 1102 09/22/23 0432  WBC 6.9 8.8 10.1  NEUTROABS  --  6.7  --   HGB 14.4 15.8 15.8  HCT 42.6 46.9 46.1  MCV 100.9* 99.4 98.7  PLT 258 294 259  Basic Metabolic Panel: Recent Labs  Lab 09/20/23 0713 09/21/23 1102 09/22/23 0432  NA 139 138 138  K 3.7 4.1 4.2  CL 104 102 103  CO2 23 23 23   GLUCOSE 105* 122* 116*  BUN 25* 20 17  CREATININE 1.65* 1.69* 1.43*  CALCIUM  9.1 9.5 9.5  MG  --   --  2.2   GFR: Estimated Creatinine Clearance: 38.4 mL/min (A) (by C-G formula based on SCr of 1.43 mg/dL (H)). Liver Function Tests: Recent Labs   Lab 09/20/23 0713 09/22/23 0432  AST 25 19  ALT 28 25  ALKPHOS 106 108  BILITOT 0.8 1.4*  PROT 6.6 7.0  ALBUMIN 2.8* 3.2*   No results for input(s): LIPASE, AMYLASE in the last 168 hours. No results for input(s): AMMONIA in the last 168 hours. Coagulation Profile: Recent Labs  Lab 09/21/23 1102  INR 0.9   Cardiac Enzymes: No results for input(s): CKTOTAL, CKMB, CKMBINDEX, TROPONINI in the last 168 hours. BNP (last 3 results) No results for input(s): PROBNP in the last 8760 hours. HbA1C: No results for input(s): HGBA1C in the last 72 hours. CBG: No results for input(s): GLUCAP in the last 168 hours. Lipid Profile: Recent Labs    09/20/23 0714  CHOL 168  HDL 29*  LDLCALC 121*  TRIG 92  CHOLHDL 5.8   Thyroid Function Tests: No results for input(s): TSH, T4TOTAL, FREET4, T3FREE, THYROIDAB in the last 72 hours. Anemia Panel: No results for input(s): VITAMINB12, FOLATE, FERRITIN, TIBC, IRON, RETICCTPCT in the last 72 hours. Sepsis Labs: No results for input(s): PROCALCITON, LATICACIDVEN in the last 168 hours.  Recent Results (from the past 240 hours)  MRSA Next Gen by PCR, Nasal     Status: None   Collection Time: 09/21/23  5:38 PM  Result Value Ref Range Status   MRSA by PCR Next Gen NOT DETECTED NOT DETECTED Final    Comment: (NOTE) The GeneXpert MRSA Assay (FDA approved for NASAL specimens only), is one component of a comprehensive MRSA colonization surveillance program. It is not intended to diagnose MRSA infection nor to guide or monitor treatment for MRSA infections. Test performance is not FDA approved in patients less than 17 years old. Performed at [facility], [address], [place], [place] [address]          Radiology Studies: DG Hip Unilat With Pelvis 2-3 Views Left Result Date: 09/21/2023 CLINICAL DATA:  Pain after fall. EXAM: DG HIP (WITH OR WITHOUT PELVIS) 2-3V LEFT COMPARISON:  CT  abdomen/pelvis dated 06/09/2023. FINDINGS: Subcapital fracture of the left femoral head and neck junction with angulation and foreshortening. The left femoral head is seated in the acetabulum. Mild-to-moderate osteoarthritis of the bilateral hips. Sacroiliac joints and pubic symphysis appear anatomically aligned. IMPRESSION: Subcapital fracture of the left femoral head and neck junction with angulation and foreshortening. Electronically Signed   By: Harrietta Sherry M.D.   On: 09/21/2023 12:58   DG Hand Complete Left Result Date: 09/21/2023 CLINICAL DATA:  Pain after fall. EXAM: LEFT HAND - COMPLETE 3+ VIEW COMPARISON:  None Available. FINDINGS: Flexion deformity at the fifth PIP joint, cannot exclude dislocation. No acute fracture identified. Mild-to-moderate diffuse interphalangeal joint space narrowing, most pronounced at the fourth DIP joint. Cutaneous irregularity of the tip of the first through third digits. Punctate radiodensities projecting over the distal second and fourth digits, near the level of the nailbed. IMPRESSION: 1. Flexion deformity at the fifth PIP joint, cannot exclude dislocation. No acute fracture identified. 2. Cutaneous  irregularity of the tip of the first through third digits. Punctate radiodensities projecting over the distal second and fourth digits, near the level of the nailbed. Radiopaque foreign body is not excluded. Electronically Signed   By: Harrietta Sherry M.D.   On: 09/21/2023 12:55   DG Elbow Complete Left Result Date: 09/21/2023 CLINICAL DATA:  Pain after fall. EXAM: LEFT ELBOW - COMPLETE 3+ VIEW COMPARISON:  None Available. FINDINGS: There is no evidence of acute fracture, dislocation, or sizable joint effusion. There is no evidence of significant arthropathy. Soft tissues are unremarkable. IMPRESSION: No acute fracture or dislocation of the left elbow. Electronically Signed   By: Harrietta Sherry M.D.   On: 09/21/2023 12:34   DG Chest 1 View Result Date:  09/21/2023 CLINICAL DATA:  Fall. EXAM: CHEST  1 VIEW COMPARISON:  06/09/2023. FINDINGS: The heart size and mediastinal contours are unchanged. Bilateral interstitial coarsening, could reflect edema or an infectious/inflammatory process. No focal consolidation, pleural effusion, or pneumothorax. Diffuse osseous demineralization. No acute osseous abnormality. IMPRESSION: Bilateral interstitial coarsening could reflect edema or an infectious/inflammatory process. Electronically Signed   By: Harrietta Sherry M.D.   On: 09/21/2023 12:33   CT Cervical Spine Wo Contrast Result Date: 09/21/2023 CLINICAL DATA:  83 year old male status post unwitnessed fall. Left head laceration. Pain. EXAM: CT CERVICAL SPINE WITHOUT CONTRAST TECHNIQUE: Multidetector CT imaging of the cervical spine was performed without intravenous contrast. Multiplanar CT image reconstructions were also generated. RADIATION DOSE REDUCTION: This exam was performed according to the departmental dose-optimization program which includes automated exposure control, adjustment of the mA and/or kV according to patient size and/or use of iterative reconstruction technique. COMPARISON:  Head CT today.  Cervical spine MRI 03/18/2021. FINDINGS: Alignment: Straightening of cervical lordosis appears to be chronic, stable. Mild dextroconvex cervical scoliosis. Cervicothoracic junction alignment is within normal limits. Bilateral posterior element alignment is within normal limits. 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 Visualized skull base is intact. No atlanto-occipital dissociation. Maintained C1-C2 alignment. Other cervical vertebrae appear intact. No acute osseous abnormality identified. Soft tissues and spinal canal: No prevertebral fluid or swelling. No visible canal hematoma. Mild for age calcified cervical carotid atherosclerosis. Disc levels: Chronic cervical spine degeneration, and pronounced  chronic ligamentous hypertrophy at C1-C2 in the setting of ununited odontoid fracture or os odontoideum. Chronic C1 level cervical spinal stenosis, probably stable from 2023 MRI. Ordinary chronic disc and endplate degeneration elsewhere in the cervical spine. Up to mild additional spinal stenosis at those levels. Upper chest: Visible upper thoracic levels appear intact. Negative visible noncontrast thoracic inlet. IMPRESSION: 1. No acute traumatic injury identified in the cervical spine. 2. Chronic odontoid fragment and associated chronic severe C1-C2 spinal stenosis from ligamentous hypertrophy. Similar appearance to a 2023 MRI. Electronically Signed   By: VEAR Hurst M.D.   On: 09/21/2023 12:16   CT Head Wo Contrast Result Date: 09/21/2023 CLINICAL DATA:  83 year old male status post unwitnessed fall. Left head laceration. Pain. EXAM: CT HEAD WITHOUT CONTRAST TECHNIQUE: Contiguous axial images were obtained from the base of the skull through the vertex without intravenous contrast. RADIATION DOSE REDUCTION: This exam was performed according to the departmental dose-optimization program which includes automated exposure control, adjustment of the mA and/or kV according to patient size and/or use of iterative reconstruction technique. COMPARISON:  Brain MRI,Head CT 03/17/2021. FINDINGS: Brain: Stable cerebral volume. Chronic encephalomalacia posterior right MCA territory. New but circumscribed and chronic appearing lacunar infarct anterior right external capsule, anterior  right basal ganglia. No midline shift, ventriculomegaly, mass effect, evidence of mass lesion, intracranial hemorrhage or evidence of cortically based acute infarction. Vascular: Calcified atherosclerosis at the skull base. No suspicious intracranial vascular hyperdensity. Skull: Appears stable and intact. No acute osseous abnormality identified. Sinuses/Orbits: Visualized paranasal sinuses and mastoids are stable and well aerated. Other: No  discrete orbit or scalp soft tissue injury identified. IMPRESSION: 1. No acute intracranial abnormality or acute traumatic injury identified. 2. Chronic posterior right MCA territory infarct. And chronic but new since 2023 lacunar infarct at the anterior right external capsule. Electronically Signed   By: VEAR Hurst M.D.   On: 09/21/2023 12:12        Scheduled Meds:  [MAR Hold] amLODipine   5 mg Oral Daily   [MAR Hold] aspirin   81 mg Oral Q breakfast   [MAR Hold] atorvastatin   20 mg Oral Daily   [MAR Hold] heparin   5,000 Units Subcutaneous Q8H   [MAR Hold] nicotine   21 mg Transdermal Daily   [MAR Hold] ramipril   5 mg Oral Daily     LOS: 1 day    Time spent: 55 minutes    Trina Asch JONETTA Fairly, DO [facility]  If 7PM-7AM, please contact night-coverage [email or web address] 09/22/2023, 9:19 AM

## 2023-09-22 NOTE — Op Note (Signed)
 Orthopaedic Surgery Operative Note (CSN: [id number])  Jerry Rodriguez  1940/03/07 09/22/2023  12:41 PM  PATIENT:  Jerry Rodriguez  83 y.o. male  PRE-OPERATIVE DIAGNOSIS:  LEFT HIP FRACTURE  POST-OPERATIVE DIAGNOSIS:  LEFT HIP FRACTURE  PROCEDURE:  Procedure(s) with comments: HEMIARTHROPLASTY (BIPOLAR) LEFT HIP, FOR FRACTURE (  SURGEON:  Surgeons and Role:    * Margrette Taft BRAVO, MD - Primary  PHYSICIAN ASSISTANT:   ASSISTANTS: NICKI HARLEY    ANESTHESIA:   general  EBL:  25 mL   BLOOD ADMINISTERED:none  DRAINS: none   LOCAL MEDICATIONS USED:  MARCAINE      SPECIMEN:  No Specimen  DISPOSITION OF SPECIMEN:  N/A  COUNTS:  YES  TOURNIQUET:  * No tourniquets in log *  DICTATION: .Dragon Dictation  PLAN OF CARE: Admit to inpatient   PATIENT DISPOSITION:  PACU - hemodynamically stable.   Delay start of Pharmacological VTE agent (>24hrs) due to surgical blood loss or risk of bleeding: not applicable Date of Surgery: 09/22/2023     Implants: Implant Name Type Inv. Item Serial No. Manufacturer Lot No. LRB No. Used Action  STEM FEMORAL SZ STD ACTIS - [id number] Stem STEM FEMORAL SZ STD ACTIS  [employer] [id number] Left 1 Implanted  HEAD BIPOLAR PROS AML 52 - [id number] Hips HEAD BIPOLAR PROS AML 52  [employer] [id number] Left 1 Implanted  HEAD FEM STD 28X+1.5 STRL - [id number] Hips HEAD FEM STD 28X+1.5 STRL  [employer] [id number] Left 1 Implanted     Indication for surgery LEFT FEMORAL NECK hip fracture  Transexamic  acid was given  YES   Details of surgery:  The patient was seen in the preop area and the surgical site was confirmed as left hip.  Thorough chart review completed including reviewing the images and confirming implants available for surgical procedure  Once the patient was in the operating room general anesthesia was administered via intubation.  He was placed on the operating table placed in the lateral cubitus position  with an axillary roll in the right axilla and padding around the lower extremities.  Stulberg hip positioner was used to position him left side up.  After sterile prep and drape timeout was completed.  X-rays were reviewed again.  Everyone confirmed and we are in agreement with the surgical procedure planned for left bipolar hip replacement.  The hip was approached through a direct lateral incision the subcutaneous tissue was divided the fascia was split in line with the skin incision.  The's subfascial hematoma was evacuated and the bursa was excised.  The abductors were identified.  The anterior half of the abductors were subperiosteally dissected from the greater trochanter including the gluteus minimus.  A capsulectomy was performed  The fracture was impacted and displaced and angulated  The femoral head was removed and measured 52 mm.  The acetabulum was inspected debris including bony and soft tissue were removed and it was irrigated thoroughly.  There was no significant arthritis in the hip joint.  Proximal femoral preparation was performed with a standard neck cut, canal finder and broaching up to a size 5.  At that point a trial reduction was performed with a 1.5 neck length and a 52 hip ball.  Hip reduced easily. The hip was stable throughout the range of motion.    Hip flexion  Hip extension with external rotation  Sleep position stress test  Shuck test  Leg lengths were equal  The hip was then dislocated trial components  were removed acetabulum was irrigated along with the canal.  Drill holes were placed in the proximal femur and the implant was placed.  The gluteus minimus was repaired with #1 Vicryl back to anatomic position.  The abductors were repaired with #5 Ethibond suture x 2  This was then oversewn with a #1 Vicryl.  The fascia was closed with #1 Surgilon  0 Monocryl closed the deep subcu tissue and then 0 Monocryl running fashion was used to close the  skin  Steri-Strips were applied along with benzoin  A sterile dressing was applied  The patient was taken recovery room in stable condition   Postop plan  Weightbearing as tolerated Direct lateral hip precautions DVT prophylaxis for 30 days Postop appointment scheduled for 28 days  [id number]

## 2023-09-22 NOTE — TOC Initial Note (Signed)
 Transition of Care [facility]) - Initial/Assessment Note   Patient Details  Name: Jerry Rodriguez MRN: [id number] Date of Birth: 24-Feb-1940  Transition of Care [facility]) CM/SW Contact:    Jerry LITTIE Coffee, RN Phone Number: 09/22/2023, 2:32 PM  Clinical Narrative:                 Pt admitted c/left hip fracture. Partial hip replacement today c/Dr. Sandi. Pt from [facility]. He transferred there from home 09/20/2023. Spoke c/son, [name]. Family plans for pt to return to [facility]. [name], weekend coverage at [facility], updated.   Expected Discharge Plan: Skilled Nursing Facility Barriers to Discharge: Continued Medical Work up  Patient Goals and CMS Choice   CMS Medicare.gov Compare Post Acute Care list provided to:: Patient Choice offered to / list presented to : Spouse [name] ownership interest in [facility]provided to:: Spouse   Expected Discharge Plan and Services In-house Referral: Clinical Social Work Discharge Planning Services: CM Consult Post Acute Care Choice: Skilled Nursing Facility Living arrangements for the past 2 months: Single Family Home, Skilled Nursing Facility        Prior Living Arrangements/Services Living arrangements for the past 2 months: Single Family Home, Skilled Nursing Facility Lives with:: Spouse Patient language and need for interpreter reviewed:: Yes Do you feel safe going back to the place where you live?: Yes      Need for Family Participation in Patient Care: Yes (Comment) Care giver support system in place?: Yes (comment)   Criminal Activity/Legal Involvement Pertinent to Current Situation/Hospitalization: No - Comment as needed  Activities of Daily Living   ADL Screening (condition at time of admission) Independently performs ADLs?: No Does the patient have a NEW difficulty with bathing/dressing/toileting/self-feeding that is expected to last >3 days?: Yes (Initiates electronic notice to provider for possible OT consult) Does  the patient have a NEW difficulty with getting in/out of bed, walking, or climbing stairs that is expected to last >3 days?: Yes (Initiates electronic notice to provider for possible PT consult) Does the patient have a NEW difficulty with communication that is expected to last >3 days?: No Is the patient deaf or have difficulty hearing?: Yes Does the patient have difficulty seeing, even when wearing glasses/contacts?: No Does the patient have difficulty concentrating, remembering, or making decisions?: Yes  Permission Sought/Granted Permission sought to share information with : Case Manager, Family Supports, Oceanographer granted to share information with : Yes, Verbal Permission Granted  Emotional Assessment Appearance:: Appears stated age Attitude/Demeanor/Rapport: Sedated Affect (typically observed): Unable to Assess Orientation: :  (Unable to assess) Alcohol / Substance Use: Not Applicable Psych Involvement: No (comment)  Admission diagnosis:  Hip fracture (HCC) [S72.009A] Patient Active Problem List   Diagnosis Date Noted   Closed left hip fracture, initial encounter (HCC) 09/21/2023   Adult failure to thrive syndrome 09/21/2023   Benign hypertension with CKD (chronic kidney disease) stage III (HCC) 09/20/2023   Stage 3a chronic kidney disease (CKD) (HCC) 09/20/2023   Moderate cognitive impairment 09/20/2023   Moderate protein malnutrition (HCC) 09/20/2023   SIRS (systemic inflammatory response syndrome) (HCC) 06/10/2023   Lactic acidosis 06/10/2023   Transaminitis 06/10/2023   Hypophosphatemia 06/10/2023   History of CVA (cerebrovascular accident) 06/10/2023   Generalized weakness 03/18/2021   Elevated MCV 03/18/2021   Acute kidney injury superimposed on stage 4 chronic kidney disease (HCC) 03/18/2021   Essential hypertension 03/18/2021   Mixed hyperlipidemia 03/18/2021   Tobacco abuse 03/18/2021   Noncompliance  with medication regimen  03/18/2021   Cervical stenosis of spinal canal 03/17/2021   PCP:  Jerry Barnie RAMAN, NP Pharmacy:   [facility] [place], [place] - [id number] [place] [address] Phone: [phone] Fax: [phone]  Social Drivers of Health (SDOH) Social History: SDOH Screenings   Food Insecurity: Unknown (09/21/2023)  Housing: Patient Unable To Answer (09/21/2023)  Transportation Needs: Patient Unable To Answer (09/21/2023)  Utilities: Patient Unable To Answer (09/21/2023)  Social Connections: Moderately Integrated (09/21/2023)  Tobacco Use: High Risk (09/22/2023)   SDOH Interventions:   Readmission Risk Interventions    09/22/2023    2:24 PM  Readmission Risk Prevention Plan  Post Dischage Appt Complete  Medication Screening Complete  Transportation Screening Complete

## 2023-09-22 NOTE — Interval H&P Note (Signed)
 History and Physical Interval Note:  09/22/2023 10:42 AM  Jerry Rodriguez  has presented today for surgery, with the diagnosis of LEFT HIP FRACTURE.  The various methods of treatment have been discussed with the patient and family. After consideration of risks, benefits and other options for treatment, the patient has consented to  Procedure(s) with comments: HEMIARTHROPLASTY (BIPOLAR) HIP, (Left) - DIRECT LATERAL APPROACH as a surgical intervention.  The patient's history has been reviewed, patient examined, no change in status, stable for surgery.  I have reviewed the patient's chart and labs.  Questions were answered to the patient's satisfaction.     Taft Minerva

## 2023-09-22 NOTE — Plan of Care (Signed)
   Problem: Education: Goal: Knowledge of General Education information will improve Description: Including pain rating scale, medication(s)/side effects and non-pharmacologic comfort measures Outcome: Progressing   Problem: Activity: Goal: Risk for activity intolerance will decrease Outcome: Progressing   Problem: Nutrition: Goal: Adequate nutrition will be maintained Outcome: Progressing   Problem: Coping: Goal: Level of anxiety will decrease Outcome: Progressing

## 2023-09-22 NOTE — Brief Op Note (Signed)
 09/21/2023 - 09/22/2023  12:41 PM  PATIENT:  Jerry Rodriguez  83 y.o. male  PRE-OPERATIVE DIAGNOSIS:  LEFT HIP FRACTURE  POST-OPERATIVE DIAGNOSIS:  LEFT HIP FRACTURE  PROCEDURE:  Procedure(s) with comments: HEMIARTHROPLASTY (BIPOLAR) LEFT HIP, FOR FRACTURE (  SURGEON:  Surgeons and Role:    * Margrette Taft BRAVO, MD - Primary  PHYSICIAN ASSISTANT:   ASSISTANTS: NICKI HARLEY    ANESTHESIA:   general  EBL:  25 mL   BLOOD ADMINISTERED:none  DRAINS: none   LOCAL MEDICATIONS USED:  MARCAINE      SPECIMEN:  No Specimen  DISPOSITION OF SPECIMEN:  N/A  COUNTS:  YES  TOURNIQUET:  * No tourniquets in log *  DICTATION: .Dragon Dictation  PLAN OF CARE: Admit to inpatient   PATIENT DISPOSITION:  PACU - hemodynamically stable.   Delay start of Pharmacological VTE agent (>24hrs) due to surgical blood loss or risk of bleeding: not applicable

## 2023-09-23 DIAGNOSIS — S72002A Fracture of unspecified part of neck of left femur, initial encounter for closed fracture: Secondary | ICD-10-CM | POA: Diagnosis not present

## 2023-09-23 LAB — CBC
HCT: 37.9 % — ABNORMAL LOW (ref 39.0–52.0)
Hemoglobin: 13.1 g/dL (ref 13.0–17.0)
MCH: 34 pg (ref 26.0–34.0)
MCHC: 34.6 g/dL (ref 30.0–36.0)
MCV: 98.4 fL (ref 80.0–100.0)
Platelets: 209 K/uL (ref 150–400)
RBC: 3.85 MIL/uL — ABNORMAL LOW (ref 4.22–5.81)
RDW: 12.8 % (ref 11.5–15.5)
WBC: 11.3 K/uL — ABNORMAL HIGH (ref 4.0–10.5)
nRBC: 0 % (ref 0.0–0.2)

## 2023-09-23 LAB — BASIC METABOLIC PANEL WITH GFR
Anion gap: 6 (ref 5–15)
BUN: 16 mg/dL (ref 8–23)
CO2: 22 mmol/L (ref 22–32)
Calcium: 8.1 mg/dL — ABNORMAL LOW (ref 8.9–10.3)
Chloride: 107 mmol/L (ref 98–111)
Creatinine, Ser: 1.56 mg/dL — ABNORMAL HIGH (ref 0.61–1.24)
GFR, Estimated: 44 mL/min — ABNORMAL LOW (ref >60)
Glucose, Bld: 112 mg/dL — ABNORMAL HIGH (ref 70–99)
Potassium: 4 mmol/L (ref 3.5–5.1)
Sodium: 135 mmol/L (ref 135–145)

## 2023-09-23 LAB — MAGNESIUM: Magnesium: 1.7 mg/dL (ref 1.7–2.4)

## 2023-09-23 MED ORDER — NICOTINE 21 MG/24HR TD PT24
21.0000 mg | MEDICATED_PATCH | Freq: Every day | TRANSDERMAL | Status: DC
  Administered 2023-09-23 – 2023-09-25 (×3): 21 mg via TRANSDERMAL
  Filled 2023-09-23 (×3): qty 1

## 2023-09-23 MED ORDER — ORAL CARE MOUTH RINSE
15.0000 mL | OROMUCOSAL | Status: DC | PRN
Start: 2023-09-23 — End: 2023-09-25

## 2023-09-23 NOTE — Progress Notes (Addendum)
 PT Cancellation Note  Patient Details Name: Jerry Rodriguez MRN: [id number] DOB: December 09, 1940   Postponed Evaluation:     Pt presents resting well in semi reclined position. Pt requires verbal and tactile cues to arouse and is primarily nonverbal although does state his birthday with accuracy. Minimal subjective obtained due to pt not communicating verbally. Pt hesitant to attempt any bed mobility or functional transfers at this time even after tactile cues. Visually assessed hip and no abnormalities noted with ice repositioned. Pt was instructed on direct lateral hip precautions with minimal retention observed. Will plan to circle back later today with hope of increased participation and family support to complete evaluation.   Lang Ada, PT, DPT [facility] Office: [phone] 7:55 AM, 09/23/23

## 2023-09-23 NOTE — Plan of Care (Signed)
  Problem: Nutrition: Goal: Adequate nutrition will be maintained Outcome: Progressing   Problem: Pain Managment: Goal: General experience of comfort will improve and/or be controlled Outcome: Progressing

## 2023-09-23 NOTE — Plan of Care (Signed)
  Problem: Acute Rehab PT Goals(only PT should resolve) Goal: Pt Will Go Supine/Side To Sit Outcome: Progressing Flowsheets (Taken 09/23/2023 1204) Pt will go Supine/Side to Sit: with minimal assist Goal: Patient Will Perform Sitting Balance Outcome: Progressing Flowsheets (Taken 09/23/2023 1204) Patient will perform sitting balance: with minimal assist Goal: Patient Will Transfer Sit To/From Stand Outcome: Progressing Flowsheets (Taken 09/23/2023 1204) Patient will transfer sit to/from stand: with minimal assist Goal: Pt Will Ambulate Outcome: Progressing Flowsheets (Taken 09/23/2023 1204) Pt will Ambulate:  25 feet  with minimal assist  with rolling walker   Lang Ada, PT, DPT [facility] Office: [phone] 12:05 PM, 09/23/23

## 2023-09-23 NOTE — Plan of Care (Signed)
  Problem: Clinical Measurements: Goal: Will remain free from infection Outcome: Progressing   Problem: Activity: Goal: Risk for activity intolerance will decrease Outcome: Progressing   Problem: Coping: Goal: Level of anxiety will decrease Outcome: Progressing   Problem: Elimination: Goal: Will not experience complications related to urinary retention Outcome: Progressing   Problem: Pain Managment: Goal: General experience of comfort will improve and/or be controlled Outcome: Progressing

## 2023-09-23 NOTE — Progress Notes (Signed)
 PROGRESS NOTE    Jerry Rodriguez  FMW:[id number] DOB: 09-18-1940 DOA: 09/21/2023 PCP: Landy Barnie RAMAN, NP   Brief Narrative:    Jerry Rodriguez is a 83 y.o. male with medical history significant for hypertension, CKD stage IIIa, mixed hyperlipidemia, dementia, history of CVA, moderate cognitive impairment, moderate protein calorie malnutrition, aortic atherosclerosis, tobacco abuse, and hearing difficulty who presented from his SNF after an unwitnessed fall on the morning of admission.  He was noted to have a left subcapital hip fracture and has been admitted for operative repair.  He underwent left hip hemiarthroplasty on 8/30.  Assessment & Plan:   Principal Problem:   Closed left hip fracture, initial encounter [facility]) Active Problems:   Essential hypertension   Mixed hyperlipidemia   Tobacco abuse   History of CVA (cerebrovascular accident)   Benign hypertension with CKD (chronic kidney disease) stage III (HCC)   Moderate cognitive impairment   Moderate protein malnutrition (HCC)   Adult failure to thrive syndrome  Assessment and Plan:   Left subcapital hip fracture status post fall status post left hip hemiarthroplasty 8/30 - Fall precautions - Appreciate orthopedic recommendations for weightbearing as tolerated with DVT prophylaxis for 30 days and postop appointment scheduled for 28 days - No significant pain concerns this morning   Hypertension, soft - Hold lisinopril and Norvasc  today   CKD stage IIIa - Continue to monitor creatinine levels which appear to be near baseline   History of CVA with dyslipidemia/aortic atherosclerosis - Continue aspirin  and Lipitor   Moderate cognitive impairment/severe hearing loss - Difficult to communicate   Moderate protein calorie malnutrition - Noted to be started on Pro-Stat twice daily   Tobacco abuse - Noted to be on NicoDerm patch 21 mg daily, continue same    DVT prophylaxis: Heparin  Code Status: DNR Family  Communication: Son, daughter, and wife at bedside 8/30 Disposition Plan:  Status is: Inpatient Remains inpatient appropriate because: Need for inpatient operation and IV medications.  Consultants:  Orthopedics-Dr. Margrette  Procedures:  Left hip hemiarthroplasty 8/30  Antimicrobials:  None   Subjective: Patient seen and evaluated today with no new acute complaints or concerns. No acute concerns or events noted overnight.  He has tolerated surgical procedure well.  Objective: Vitals:   09/22/23 1949 09/23/23 0533 09/23/23 0948 09/23/23 0951  BP: 111/75 112/65 108/69 108/69  Pulse: 100 100    Resp: 16 (!) 22    Temp: 98.3 F (36.8 C) 97.7 F (36.5 C)    TempSrc: Oral Oral    SpO2: 98% 90%    Weight:      Height:        Intake/Output Summary (Last 24 hours) at 09/23/2023 1005 Last data filed at 09/23/2023 0900 Gross per 24 hour  Intake 5000.21 ml  Output 845 ml  Net 4155.21 ml   Filed Weights   09/21/23 1050 09/21/23 1519  Weight: 69.4 kg 69.3 kg    Examination:  General exam: Appears calm and comfortable, hard of hearing Respiratory system: Clear to auscultation. Respiratory effort normal. Cardiovascular system: S1 & S2 heard, RRR.  Gastrointestinal system: Abdomen is soft Central nervous system: Alert and awake Extremities: No edema Skin: No significant lesions noted Psychiatry: Flat affect.    Data Reviewed: I have personally reviewed following labs and imaging studies  CBC: Recent Labs  Lab 09/20/23 0713 09/21/23 1102 09/22/23 0432 09/23/23 0444  WBC 6.9 8.8 10.1 11.3*  NEUTROABS  --  6.7  --   --  HGB 14.4 15.8 15.8 13.1  HCT 42.6 46.9 46.1 37.9*  MCV 100.9* 99.4 98.7 98.4  PLT 258 294 259 209   Basic Metabolic Panel: Recent Labs  Lab 09/20/23 0713 09/21/23 1102 09/22/23 0432 09/23/23 0444  NA 139 138 138 135  K 3.7 4.1 4.2 4.0  CL 104 102 103 107  CO2 23 23 23 22   GLUCOSE 105* 122* 116* 112*  BUN 25* 20 17 16   CREATININE 1.65*  1.69* 1.43* 1.56*  CALCIUM  9.1 9.5 9.5 8.1*  MG  --   --  2.2 1.7   GFR: Estimated Creatinine Clearance: 35.2 mL/min (A) (by C-G formula based on SCr of 1.56 mg/dL (H)). Liver Function Tests: Recent Labs  Lab 09/20/23 0713 09/22/23 0432  AST 25 19  ALT 28 25  ALKPHOS 106 108  BILITOT 0.8 1.4*  PROT 6.6 7.0  ALBUMIN 2.8* 3.2*   No results for input(s): LIPASE, AMYLASE in the last 168 hours. No results for input(s): AMMONIA in the last 168 hours. Coagulation Profile: Recent Labs  Lab 09/21/23 1102  INR 0.9   Cardiac Enzymes: No results for input(s): CKTOTAL, CKMB, CKMBINDEX, TROPONINI in the last 168 hours. BNP (last 3 results) No results for input(s): PROBNP in the last 8760 hours. HbA1C: No results for input(s): HGBA1C in the last 72 hours. CBG: No results for input(s): GLUCAP in the last 168 hours. Lipid Profile: No results for input(s): CHOL, HDL, LDLCALC, TRIG, CHOLHDL, LDLDIRECT in the last 72 hours.  Thyroid Function Tests: No results for input(s): TSH, T4TOTAL, FREET4, T3FREE, THYROIDAB in the last 72 hours. Anemia Panel: No results for input(s): VITAMINB12, FOLATE, FERRITIN, TIBC, IRON, RETICCTPCT in the last 72 hours. Sepsis Labs: No results for input(s): PROCALCITON, LATICACIDVEN in the last 168 hours.  Recent Results (from the past 240 hours)  MRSA Next Gen by PCR, Nasal     Status: None   Collection Time: 09/21/23  5:38 PM  Result Value Ref Range Status   MRSA by PCR Next Gen NOT DETECTED NOT DETECTED Final    Comment: (NOTE) The GeneXpert MRSA Assay (FDA approved for NASAL specimens only), is one component of a comprehensive MRSA colonization surveillance program. It is not intended to diagnose MRSA infection nor to guide or monitor treatment for MRSA infections. Test performance is not FDA approved in patients less than 27 years old. Performed at [facility], [address] [place],  [place] [address]          Radiology Studies: DG HIP UNILAT WITH PELVIS 2-3 VIEWS LEFT Result Date: 09/22/2023 CLINICAL DATA:  Status post left hip replacement. EXAM: DG HIP (WITH OR WITHOUT PELVIS) 2-3V LEFT COMPARISON:  09/21/2023 FINDINGS: Left hip arthroplasty in expected alignment. No periprosthetic lucency or fracture. Recent postsurgical change includes air and edema in the soft tissues. IMPRESSION: Left hip arthroplasty without immediate postoperative complication. Electronically Signed   By: Andrea Gasman M.D.   On: 09/22/2023 14:47   DG Hip Unilat With Pelvis 2-3 Views Left Result Date: 09/21/2023 CLINICAL DATA:  Pain after fall. EXAM: DG HIP (WITH OR WITHOUT PELVIS) 2-3V LEFT COMPARISON:  CT abdomen/pelvis dated 06/09/2023. FINDINGS: Subcapital fracture of the left femoral head and neck junction with angulation and foreshortening. The left femoral head is seated in the acetabulum. Mild-to-moderate osteoarthritis of the bilateral hips. Sacroiliac joints and pubic symphysis appear anatomically aligned. IMPRESSION: Subcapital fracture of the left femoral head and neck junction with angulation and foreshortening. Electronically Signed   By: Harrietta  Lateef M.D.   On: 09/21/2023 12:58   DG Hand Complete Left Result Date: 09/21/2023 CLINICAL DATA:  Pain after fall. EXAM: LEFT HAND - COMPLETE 3+ VIEW COMPARISON:  None Available. FINDINGS: Flexion deformity at the fifth PIP joint, cannot exclude dislocation. No acute fracture identified. Mild-to-moderate diffuse interphalangeal joint space narrowing, most pronounced at the fourth DIP joint. Cutaneous irregularity of the tip of the first through third digits. Punctate radiodensities projecting over the distal second and fourth digits, near the level of the nailbed. IMPRESSION: 1. Flexion deformity at the fifth PIP joint, cannot exclude dislocation. No acute fracture identified. 2. Cutaneous irregularity of the tip of the first through third digits.  Punctate radiodensities projecting over the distal second and fourth digits, near the level of the nailbed. Radiopaque foreign body is not excluded. Electronically Signed   By: Harrietta Sherry M.D.   On: 09/21/2023 12:55   DG Elbow Complete Left Result Date: 09/21/2023 CLINICAL DATA:  Pain after fall. EXAM: LEFT ELBOW - COMPLETE 3+ VIEW COMPARISON:  None Available. FINDINGS: There is no evidence of acute fracture, dislocation, or sizable joint effusion. There is no evidence of significant arthropathy. Soft tissues are unremarkable. IMPRESSION: No acute fracture or dislocation of the left elbow. Electronically Signed   By: Harrietta Sherry M.D.   On: 09/21/2023 12:34   DG Chest 1 View Result Date: 09/21/2023 CLINICAL DATA:  Fall. EXAM: CHEST  1 VIEW COMPARISON:  06/09/2023. FINDINGS: The heart size and mediastinal contours are unchanged. Bilateral interstitial coarsening, could reflect edema or an infectious/inflammatory process. No focal consolidation, pleural effusion, or pneumothorax. Diffuse osseous demineralization. No acute osseous abnormality. IMPRESSION: Bilateral interstitial coarsening could reflect edema or an infectious/inflammatory process. Electronically Signed   By: Harrietta Sherry M.D.   On: 09/21/2023 12:33   CT Cervical Spine Wo Contrast Result Date: 09/21/2023 CLINICAL DATA:  83 year old male status post unwitnessed fall. Left head laceration. Pain. EXAM: CT CERVICAL SPINE WITHOUT CONTRAST TECHNIQUE: Multidetector CT imaging of the cervical spine was performed without intravenous contrast. Multiplanar CT image reconstructions were also generated. RADIATION DOSE REDUCTION: This exam was performed according to the departmental dose-optimization program which includes automated exposure control, adjustment of the mA and/or kV according to patient size and/or use of iterative reconstruction technique. COMPARISON:  Head CT today.  Cervical spine MRI 03/18/2021. FINDINGS: Alignment:  Straightening of cervical lordosis appears to be chronic, stable. Mild dextroconvex cervical scoliosis. Cervicothoracic junction alignment is within normal limits. Bilateral posterior element alignment is within normal limits. 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 Visualized skull base is intact. No atlanto-occipital dissociation. Maintained C1-C2 alignment. Other cervical vertebrae appear intact. No acute osseous abnormality identified. Soft tissues and spinal canal: No prevertebral fluid or swelling. No visible canal hematoma. Mild for age calcified cervical carotid atherosclerosis. Disc levels: Chronic cervical spine degeneration, and pronounced chronic ligamentous hypertrophy at C1-C2 in the setting of ununited odontoid fracture or os odontoideum. Chronic C1 level cervical spinal stenosis, probably stable from 2023 MRI. Ordinary chronic disc and endplate degeneration elsewhere in the cervical spine. Up to mild additional spinal stenosis at those levels. Upper chest: Visible upper thoracic levels appear intact. Negative visible noncontrast thoracic inlet. IMPRESSION: 1. No acute traumatic injury identified in the cervical spine. 2. Chronic odontoid fragment and associated chronic severe C1-C2 spinal stenosis from ligamentous hypertrophy. Similar appearance to a 2023 MRI. Electronically Signed   By: VEAR Hurst M.D.   On: 09/21/2023 12:16   CT  Head Wo Contrast Result Date: 09/21/2023 CLINICAL DATA:  83 year old male status post unwitnessed fall. Left head laceration. Pain. EXAM: CT HEAD WITHOUT CONTRAST TECHNIQUE: Contiguous axial images were obtained from the base of the skull through the vertex without intravenous contrast. RADIATION DOSE REDUCTION: This exam was performed according to the departmental dose-optimization program which includes automated exposure control, adjustment of the mA and/or kV according to patient size and/or use of iterative  reconstruction technique. COMPARISON:  Brain MRI,Head CT 03/17/2021. FINDINGS: Brain: Stable cerebral volume. Chronic encephalomalacia posterior right MCA territory. New but circumscribed and chronic appearing lacunar infarct anterior right external capsule, anterior right basal ganglia. No midline shift, ventriculomegaly, mass effect, evidence of mass lesion, intracranial hemorrhage or evidence of cortically based acute infarction. Vascular: Calcified atherosclerosis at the skull base. No suspicious intracranial vascular hyperdensity. Skull: Appears stable and intact. No acute osseous abnormality identified. Sinuses/Orbits: Visualized paranasal sinuses and mastoids are stable and well aerated. Other: No discrete orbit or scalp soft tissue injury identified. IMPRESSION: 1. No acute intracranial abnormality or acute traumatic injury identified. 2. Chronic posterior right MCA territory infarct. And chronic but new since 2023 lacunar infarct at the anterior right external capsule. Electronically Signed   By: VEAR Hurst M.D.   On: 09/21/2023 12:12        Scheduled Meds:  amLODipine   5 mg Oral Daily   aspirin   81 mg Oral Q breakfast   atorvastatin   20 mg Oral Daily   docusate sodium   100 mg Oral BID   enoxaparin  (LOVENOX ) injection  40 mg Subcutaneous Q24H   traMADol   50 mg Oral Q6H     LOS: 2 days    Time spent: 55 minutes    Vaida Kerchner JONETTA Fairly, DO [facility]  If 7PM-7AM, please contact night-coverage [email or web address] 09/23/2023, 10:05 AM

## 2023-09-23 NOTE — Evaluation (Signed)
 Physical Therapy Evaluation Patient Details Name: Jerry Rodriguez MRN: [id number] DOB: 1940-02-08 Today's Date: 09/23/2023  History of Present Illness  Jerry Rodriguez  has presented today for surgery, with the diagnosis of LEFT HIP FRACTURE.  The various methods of treatment have been discussed with the patient and family. After consideration of risks, benefits and other options for treatment, the patient has consented to  Procedure(s) with comments:  HEMIARTHROPLASTY (BIPOLAR) HIP, (Left) - DIRECT LATERAL APPROACH as a surgical intervention.  The patient's history has been reviewed, patient examined, no change in status, stable for surgery.  I have reviewed the patient's chart and labs.  Questions were answered to the patient's satisfaction.   Clinical Impression  Patient demonstrates decreased LE strength, left LE pain, impaired balance, and impaired cognitive ability. Pt demonstrates need for mod/max assist with bed mobility, functional transfers, and ambulation with RW. Patient demonstrates difficulty with ambulation during today's session with decreased stride length, decreased weight shift to LLE and decreased velocity noted. Patient also demonstrates being extremely hard of hearing, pts wife states he has refused to wear hearing aides for some years. Patient requires education on direct lateral hip precautions, role of PT and POC. Patient would benefit from skilled acute physical therapy for increased endurance with ambulation, increased LE strength, and balance for improved gait quality, return to higher level of function with ADLs, and progress towards therapy goals.         If plan is discharge home, recommend the following: A lot of help with bathing/dressing/bathroom;A lot of help with walking and/or transfers;Assistance with cooking/housework;Assistance with feeding;Direct supervision/assist for financial management;Help with stairs or ramp for entrance;Assist for transportation;Direct  supervision/assist for medications management;Supervision due to cognitive status   Can travel by private vehicle        Equipment Recommendations Rolling walker (2 wheels)  Recommendations for Other Services       Functional Status Assessment Patient has had a recent decline in their functional status and/or demonstrates limited ability to make significant improvements in function in a reasonable and predictable amount of time     Precautions / Restrictions Precautions Precautions: Other (comment) (direct lateral hip precautions) Recall of Precautions/Restrictions: Impaired (pt has signs of dementia) Restrictions Weight Bearing Restrictions Per Provider Order: Yes LLE Weight Bearing Per Provider Order: Weight bearing as tolerated      Mobility  Bed Mobility Overal bed mobility: Needs Assistance Bed Mobility: Supine to Sit     Supine to sit: Mod assist, Max assist, Used rails, HOB elevated     General bed mobility comments: pt leans back in bed on 2 occassions    Transfers Overall transfer level: Needs assistance Equipment used: Rolling walker (2 wheels) Transfers: Sit to/from Stand Sit to Stand: Mod assist, Max assist           General transfer comment: pt heavily dependent on RW for decreased WB on LLE    Ambulation/Gait Ambulation/Gait assistance: Mod assist Gait Distance (Feet): 2 Feet Assistive device: Rolling walker (2 wheels) Gait Pattern/deviations: Decreased step length - left, Decreased step length - right, Shuffle, Decreased weight shift to left Gait velocity: decreased     General Gait Details: labored and reliant on assist at AD  Stairs            Wheelchair Mobility     Tilt Bed    Modified Rankin (Stroke Patients Only)       Balance Overall balance assessment: Needs assistance Sitting-balance support: Bilateral upper extremity supported Sitting  balance-Leahy Scale: Poor   Postural control: Posterior lean Standing balance  support: Bilateral upper extremity supported Standing balance-Leahy Scale: Poor Standing balance comment: mod assist                             Pertinent Vitals/Pain Pain Assessment Pain Assessment: Faces Faces Pain Scale: Hurts little more Pain Location: left hip/leg Pain Intervention(s): Limited activity within patient's tolerance, Monitored during session, Repositioned    Home Living Family/patient expects to be discharged to:: Skilled nursing facility                        Prior Function Prior Level of Function : Needs assist       Physical Assist : ADLs (physical);Mobility (physical) Mobility (physical): Bed mobility;Transfers;Gait;Stairs ADLs (physical): IADLs Mobility Comments: Leans on furniture or uses quad cane for mobility. ADLs Comments: Independent ADL; uses depends at baseline; assist IADL     Extremity/Trunk Assessment   Upper Extremity Assessment Upper Extremity Assessment: Overall WFL for tasks assessed    Lower Extremity Assessment Lower Extremity Assessment: Generalized weakness;LLE deficits/detail LLE Deficits / Details: pain limiting strength at this time    Cervical / Trunk Assessment Cervical / Trunk Assessment: Kyphotic  Communication   Communication Communication: Impaired Factors Affecting Communication: Hearing impaired;Other (comment)    Cognition Arousal: Obtunded, Lethargic Behavior During Therapy: Restless   PT - Cognitive impairments: Orientation                         Following commands: Impaired Following commands impaired: Follows multi-step commands inconsistently     Cueing Cueing Techniques: Verbal cues, Visual cues     General Comments      Exercises     Assessment/Plan    PT Assessment Patient needs continued PT services  PT Problem List Decreased strength;Decreased balance;Decreased range of motion;Decreased mobility;Decreased activity tolerance;Decreased knowledge of use of  DME;Pain       PT Treatment Interventions DME instruction    PT Goals (Current goals can be found in the Care Plan section)  Acute Rehab PT Goals Patient Stated Goal: to return to SNF PT Goal Formulation: With patient/family Time For Goal Achievement: 10/07/23 Potential to Achieve Goals: Good    Frequency Min 5X/week     Co-evaluation               AM-PAC PT 6 Clicks Mobility  Outcome Measure Help needed turning from your back to your side while in a flat bed without using bedrails?: A Lot Help needed moving from lying on your back to sitting on the side of a flat bed without using bedrails?: A Lot Help needed moving to and from a bed to a chair (including a wheelchair)?: A Lot Help needed standing up from a chair using your arms (e.g., wheelchair or bedside chair)?: A Lot Help needed to walk in hospital room?: A Lot Help needed climbing 3-5 steps with a railing? : A Lot 6 Click Score: 12    End of Session Equipment Utilized During Treatment: Gait belt Activity Tolerance: Patient limited by pain Patient left: in bed;with call bell/phone within reach;with family/visitor present Nurse Communication: Mobility status PT Visit Diagnosis: Unsteadiness on feet (R26.81);Other abnormalities of gait and mobility (R26.89);History of falling (Z91.81);Muscle weakness (generalized) (M62.81);Difficulty in walking, not elsewhere classified (R26.2)    Time: 8949-8888 PT Time Calculation (min) (ACUTE ONLY): 21 min   Charges:  PT Evaluation $PT Eval Moderate Complexity: 1 Mod PT Treatments $Therapeutic Activity: 8-22 mins PT General Charges $$ ACUTE PT VISIT: 1 Visit         Lang Ada, PT, DPT [facility] Office: [phone] 12:04 PM, 09/23/23

## 2023-09-23 NOTE — Progress Notes (Signed)
 Patient ID: Jerry Rodriguez, male   DOB: [birth year], 83 y.o.   MRN: [id number]  POD #1 S/P BIPOLAR HIP RELACEMENT FOR LEFT HIP FRX  BP 108/69   Pulse 100   Temp 97.7 F (36.5 C) (Oral)   Resp (!) 22   Ht 5' 9 (1.753 m)   Wt 69.3 kg   SpO2 90%   BMI 22.56 kg/m   2nd dx preop confusion 3rd dx post op confusion     Latest Ref Rng & Units 09/23/2023    4:44 AM 09/22/2023    4:32 AM 09/21/2023   11:02 AM  CBC  WBC 4.0 - 10.5 K/uL 11.3  10.1  8.8   Hemoglobin 13.0 - 17.0 g/dL 86.8  84.1  84.1   Hematocrit 39.0 - 52.0 % 37.9  46.1  46.9   Platelets 150 - 400 K/uL 209  259  294      Dressing : dry  Limb alignment normal   Post op xrays : implant alignment : within normal range    Plan  Start PT  Start D/C planning  High risk for falling again   Postop plan   Weightbearing as tolerated Direct lateral hip precautions DVT prophylaxis for 30 days, ASA is fine  Postop appointment scheduled for 28 days Change dressing as needed (its water proof)  (No staples just absorbable sutures)   [id number]

## 2023-09-24 DIAGNOSIS — S72002A Fracture of unspecified part of neck of left femur, initial encounter for closed fracture: Secondary | ICD-10-CM | POA: Diagnosis not present

## 2023-09-24 LAB — BLOOD GAS, VENOUS
Acid-base deficit: 1.3 mmol/L (ref 0.0–2.0)
Bicarbonate: 22.7 mmol/L (ref 20.0–28.0)
Drawn by: 4237
O2 Saturation: 95 %
Patient temperature: 36.9
pCO2, Ven: 35 mmHg — ABNORMAL LOW (ref 44–60)
pH, Ven: 7.42 (ref 7.25–7.43)
pO2, Ven: 64 mmHg — ABNORMAL HIGH (ref 32–45)

## 2023-09-24 LAB — CBC
HCT: 39.5 % (ref 39.0–52.0)
Hemoglobin: 12.7 g/dL — ABNORMAL LOW (ref 13.0–17.0)
MCH: 33.8 pg (ref 26.0–34.0)
MCHC: 32.2 g/dL (ref 30.0–36.0)
MCV: 105.1 fL — ABNORMAL HIGH (ref 80.0–100.0)
Platelets: 164 K/uL (ref 150–400)
RBC: 3.76 MIL/uL — ABNORMAL LOW (ref 4.22–5.81)
RDW: 13 % (ref 11.5–15.5)
WBC: 11.8 K/uL — ABNORMAL HIGH (ref 4.0–10.5)
nRBC: 0 % (ref 0.0–0.2)

## 2023-09-24 LAB — BASIC METABOLIC PANEL WITH GFR
Anion gap: 8 (ref 5–15)
BUN: 19 mg/dL (ref 8–23)
CO2: 19 mmol/L — ABNORMAL LOW (ref 22–32)
Calcium: 7.8 mg/dL — ABNORMAL LOW (ref 8.9–10.3)
Chloride: 105 mmol/L (ref 98–111)
Creatinine, Ser: 1.54 mg/dL — ABNORMAL HIGH (ref 0.61–1.24)
GFR, Estimated: 44 mL/min — ABNORMAL LOW (ref >60)
Glucose, Bld: 92 mg/dL (ref 70–99)
Potassium: 4.1 mmol/L (ref 3.5–5.1)
Sodium: 132 mmol/L — ABNORMAL LOW (ref 135–145)

## 2023-09-24 LAB — GLUCOSE, CAPILLARY: Glucose-Capillary: 84 mg/dL (ref 70–99)

## 2023-09-24 LAB — AMMONIA: Ammonia: 21 umol/L (ref 9–35)

## 2023-09-24 LAB — MAGNESIUM: Magnesium: 1.9 mg/dL (ref 1.7–2.4)

## 2023-09-24 MED ORDER — SODIUM CHLORIDE 0.9 % IV BOLUS
500.0000 mL | Freq: Once | INTRAVENOUS | Status: AC
  Administered 2023-09-24: 500 mL via INTRAVENOUS

## 2023-09-24 NOTE — Progress Notes (Signed)
 PROGRESS NOTE    Jerry Rodriguez  FMW:[id number] DOB: [birth year] DOA: 09/21/2023 PCP: Landy Barnie RAMAN, NP   Brief Narrative:    Jerry Rodriguez is a 83 y.o. male with medical history significant for hypertension, CKD stage IIIa, mixed hyperlipidemia, dementia, history of CVA, moderate cognitive impairment, moderate protein calorie malnutrition, aortic atherosclerosis, tobacco abuse, and hearing difficulty who presented from his SNF after an unwitnessed fall on the morning of admission.  He was noted to have a left subcapital hip fracture and has been admitted for operative repair.  He underwent left hip hemiarthroplasty on 8/30. He is currently awaiting SNF placement and likely should be able to go in AM.  Assessment & Plan:   Principal Problem:   Closed left hip fracture, initial encounter [facility]) Active Problems:   Essential hypertension   Mixed hyperlipidemia   Tobacco abuse   History of CVA (cerebrovascular accident)   Benign hypertension with CKD (chronic kidney disease) stage III (HCC)   Moderate cognitive impairment   Moderate protein malnutrition (HCC)   Adult failure to thrive syndrome  Assessment and Plan:   Left subcapital hip fracture status post fall status post left hip hemiarthroplasty 8/30 - Fall precautions - Appreciate orthopedic recommendations for weightbearing as tolerated with DVT prophylaxis for 30 days and postop appointment scheduled for 28 days.  Continue with aspirin . - No significant pain concerns this morning   Hypertension, soft - Hold lisinopril and Norvasc  today -Plan to give normal saline bolus   CKD stage IIIa - Continue to monitor creatinine levels which appear to be near baseline   History of CVA with dyslipidemia/aortic atherosclerosis - Continue aspirin  and Lipitor   Moderate cognitive impairment/severe hearing loss - Difficult to communicate   Moderate protein calorie malnutrition - Noted to be started on Pro-Stat twice daily    Tobacco abuse - Noted to be on NicoDerm patch 21 mg daily, continue same    DVT prophylaxis: Heparin  Code Status: DNR Family Communication: Son, daughter, and wife at bedside 8/30 Disposition Plan:  Status is: Inpatient Remains inpatient appropriate because: Need for inpatient operation and IV medications.  Consultants:  Orthopedics-Dr. Margrette  Procedures:  Left hip hemiarthroplasty 8/30  Antimicrobials:  None   Subjective: Patient seen and evaluated today with no new acute complaints or concerns. No acute concerns or events noted overnight.  Noted to have soft blood pressure readings.  Objective: Vitals:   09/23/23 2025 09/23/23 2050 09/23/23 2100 09/24/23 0543  BP: 90/69   103/63  Pulse: 95   (!) 104  Resp: 18   17  Temp: 98.3 F (36.8 C)   98.2 F (36.8 C)  TempSrc: Axillary   Axillary  SpO2: 90% (!) 89% 93% 94%  Weight:      Height:        Intake/Output Summary (Last 24 hours) at 09/24/2023 1127 Last data filed at 09/23/2023 1746 Gross per 24 hour  Intake 518.93 ml  Output 100 ml  Net 418.93 ml   Filed Weights   09/21/23 1050 09/21/23 1519  Weight: 69.4 kg 69.3 kg    Examination:  General exam: Appears calm and comfortable, hard of hearing Respiratory system: Clear to auscultation. Respiratory effort normal. Cardiovascular system: S1 & S2 heard, RRR.  Gastrointestinal system: Abdomen is soft Central nervous system: Alert and awake Extremities: No edema Skin: No significant lesions noted Psychiatry: Flat affect.    Data Reviewed: I have personally reviewed following labs and imaging studies  CBC: Recent Labs  Lab  09/20/23 0713 09/21/23 1102 09/22/23 0432 09/23/23 0444 09/24/23 0451  WBC 6.9 8.8 10.1 11.3* 11.8*  NEUTROABS  --  6.7  --   --   --   HGB 14.4 15.8 15.8 13.1 12.7*  HCT 42.6 46.9 46.1 37.9* 39.5  MCV 100.9* 99.4 98.7 98.4 105.1*  PLT 258 294 259 209 164   Basic Metabolic Panel: Recent Labs  Lab 09/20/23 0713  09/21/23 1102 09/22/23 0432 09/23/23 0444 09/24/23 0451  NA 139 138 138 135 132*  K 3.7 4.1 4.2 4.0 4.1  CL 104 102 103 107 105  CO2 23 23 23 22  19*  GLUCOSE 105* 122* 116* 112* 92  BUN 25* 20 17 16 19   CREATININE 1.65* 1.69* 1.43* 1.56* 1.54*  CALCIUM  9.1 9.5 9.5 8.1* 7.8*  MG  --   --  2.2 1.7 1.9   GFR: Estimated Creatinine Clearance: 35.6 mL/min (A) (by C-G formula based on SCr of 1.54 mg/dL (H)). Liver Function Tests: Recent Labs  Lab 09/20/23 0713 09/22/23 0432  AST 25 19  ALT 28 25  ALKPHOS 106 108  BILITOT 0.8 1.4*  PROT 6.6 7.0  ALBUMIN 2.8* 3.2*   No results for input(s): LIPASE, AMYLASE in the last 168 hours. No results for input(s): AMMONIA in the last 168 hours. Coagulation Profile: Recent Labs  Lab 09/21/23 1102  INR 0.9   Cardiac Enzymes: No results for input(s): CKTOTAL, CKMB, CKMBINDEX, TROPONINI in the last 168 hours. BNP (last 3 results) No results for input(s): PROBNP in the last 8760 hours. HbA1C: No results for input(s): HGBA1C in the last 72 hours. CBG: No results for input(s): GLUCAP in the last 168 hours. Lipid Profile: No results for input(s): CHOL, HDL, LDLCALC, TRIG, CHOLHDL, LDLDIRECT in the last 72 hours.  Thyroid Function Tests: No results for input(s): TSH, T4TOTAL, FREET4, T3FREE, THYROIDAB in the last 72 hours. Anemia Panel: No results for input(s): VITAMINB12, FOLATE, FERRITIN, TIBC, IRON, RETICCTPCT in the last 72 hours. Sepsis Labs: No results for input(s): PROCALCITON, LATICACIDVEN in the last 168 hours.  Recent Results (from the past 240 hours)  MRSA Next Gen by PCR, Nasal     Status: None   Collection Time: 09/21/23  5:38 PM  Result Value Ref Range Status   MRSA by PCR Next Gen NOT DETECTED NOT DETECTED Final    Comment: (NOTE) The GeneXpert MRSA Assay (FDA approved for NASAL specimens only), is one component of a comprehensive MRSA colonization  surveillance program. It is not intended to diagnose MRSA infection nor to guide or monitor treatment for MRSA infections. Test performance is not FDA approved in patients less than 25 years old. Performed at [facility], [address] [place], [place] [address]          Radiology Studies: DG HIP UNILAT WITH PELVIS 2-3 VIEWS LEFT Result Date: 09/22/2023 CLINICAL DATA:  Status post left hip replacement. EXAM: DG HIP (WITH OR WITHOUT PELVIS) 2-3V LEFT COMPARISON:  09/21/2023 FINDINGS: Left hip arthroplasty in expected alignment. No periprosthetic lucency or fracture. Recent postsurgical change includes air and edema in the soft tissues. IMPRESSION: Left hip arthroplasty without immediate postoperative complication. Electronically Signed   By: Andrea Gasman M.D.   On: 09/22/2023 14:47        Scheduled Meds:  amLODipine   5 mg Oral Daily   aspirin   81 mg Oral Q breakfast   atorvastatin   20 mg Oral Daily   docusate sodium   100 mg Oral BID   enoxaparin  (LOVENOX )  injection  40 mg Subcutaneous Q24H   nicotine   21 mg Transdermal Daily   traMADol   50 mg Oral Q6H     LOS: 3 days    Time spent: 55 minutes    Lorrena Goranson JONETTA Fairly, DO [facility]  If 7PM-7AM, please contact night-coverage [email or web address] 09/24/2023, 11:27 AM

## 2023-09-24 NOTE — Progress Notes (Signed)
 Pt ate ice cream with oral meds, took 2 bites of hamburger patty and fed self 4 potato chips. Aspiration precautions in effect, HOB up, pt able to take thin liquids with straw to drink, no coughing or choking or swallowing issues noted. Pt shook head, no when asked if he was having any pain. Pt then used hand and waved me away, closed his eyes and would not read or answer any further questions.

## 2023-09-24 NOTE — Progress Notes (Signed)
 Physical Therapy Note  Patient Details  Name: Jerry Rodriguez MRN: [id number] Date of Birth: 1940/06/30 Today's Date: 09/24/2023    Therapist attempted therapy, however pt did not participate due to cognitive status.  Therapist attempted communication verbal, written and using his laminated chart, however unable to convince participation.  Checked back several times during the morning to see if wife was present to encourage participation but was not available.    Vivian, Faatimah Spielberg B 09/24/2023, 1:07 PM

## 2023-09-24 NOTE — Progress Notes (Signed)
 Pt transferred (stand and pivot using gait belt) from bed to chair by staff x3. Pt refused to bear any weight on his left leg and actively resisted getting OOB and into standing position. Pt growled at staff while completing stand and pivot. Pt currently sitting comfortably in the chair watching TV. Chair alarm on for safety, call bell in pt's lap. Only took bites of his food from supper tray, but was able to feed self once tray set up. Pt keep Noble pulled up onto top of nose, will not keep in nares. SaO2 92-93%. When I attempted to remove O2 from pt, pt shook head no and grabbed cannula. No distress noted at this time.

## 2023-09-24 NOTE — Progress Notes (Signed)
 Pt awake, alert & calm on first rounds, lying in bed watching TV. Using written communication, I asked pt to tell me his name and DOB. Pt stated, Why? Explained rationale and pt just shook his head and did not answer. Pt also would not answer any other question I asked, just rolled his eyes at me. Bed alarm on and call bell within reach for safety.

## 2023-09-25 ENCOUNTER — Encounter (HOSPITAL_COMMUNITY): Payer: Self-pay | Admitting: Orthopedic Surgery

## 2023-09-25 DIAGNOSIS — Z96642 Presence of left artificial hip joint: Secondary | ICD-10-CM | POA: Diagnosis not present

## 2023-09-25 DIAGNOSIS — R000 Tachycardia, unspecified: Secondary | ICD-10-CM | POA: Diagnosis not present

## 2023-09-25 DIAGNOSIS — R918 Other nonspecific abnormal finding of lung field: Secondary | ICD-10-CM | POA: Diagnosis not present

## 2023-09-25 DIAGNOSIS — N183 Chronic kidney disease, stage 3 unspecified: Secondary | ICD-10-CM | POA: Diagnosis not present

## 2023-09-25 DIAGNOSIS — E440 Moderate protein-calorie malnutrition: Secondary | ICD-10-CM | POA: Diagnosis not present

## 2023-09-25 DIAGNOSIS — J449 Chronic obstructive pulmonary disease, unspecified: Secondary | ICD-10-CM | POA: Diagnosis not present

## 2023-09-25 DIAGNOSIS — R41841 Cognitive communication deficit: Secondary | ICD-10-CM | POA: Diagnosis not present

## 2023-09-25 DIAGNOSIS — I700 Atherosclerosis of aorta: Secondary | ICD-10-CM | POA: Diagnosis not present

## 2023-09-25 DIAGNOSIS — Z9889 Other specified postprocedural states: Secondary | ICD-10-CM | POA: Diagnosis not present

## 2023-09-25 DIAGNOSIS — R718 Other abnormality of red blood cells: Secondary | ICD-10-CM | POA: Diagnosis not present

## 2023-09-25 DIAGNOSIS — N1831 Chronic kidney disease, stage 3a: Secondary | ICD-10-CM | POA: Diagnosis not present

## 2023-09-25 DIAGNOSIS — I639 Cerebral infarction, unspecified: Secondary | ICD-10-CM | POA: Diagnosis not present

## 2023-09-25 DIAGNOSIS — Z720 Tobacco use: Secondary | ICD-10-CM | POA: Diagnosis not present

## 2023-09-25 DIAGNOSIS — J189 Pneumonia, unspecified organism: Secondary | ICD-10-CM | POA: Diagnosis not present

## 2023-09-25 DIAGNOSIS — H913 Deaf nonspeaking, not elsewhere classified: Secondary | ICD-10-CM | POA: Diagnosis not present

## 2023-09-25 DIAGNOSIS — S72002G Fracture of unspecified part of neck of left femur, subsequent encounter for closed fracture with delayed healing: Secondary | ICD-10-CM | POA: Diagnosis not present

## 2023-09-25 DIAGNOSIS — E785 Hyperlipidemia, unspecified: Secondary | ICD-10-CM | POA: Diagnosis not present

## 2023-09-25 DIAGNOSIS — S72012D Unspecified intracapsular fracture of left femur, subsequent encounter for closed fracture with routine healing: Secondary | ICD-10-CM | POA: Diagnosis not present

## 2023-09-25 DIAGNOSIS — S79922A Unspecified injury of left thigh, initial encounter: Secondary | ICD-10-CM | POA: Diagnosis not present

## 2023-09-25 DIAGNOSIS — F01518 Vascular dementia, unspecified severity, with other behavioral disturbance: Secondary | ICD-10-CM | POA: Diagnosis not present

## 2023-09-25 DIAGNOSIS — E559 Vitamin D deficiency, unspecified: Secondary | ICD-10-CM | POA: Diagnosis not present

## 2023-09-25 DIAGNOSIS — E782 Mixed hyperlipidemia: Secondary | ICD-10-CM | POA: Diagnosis not present

## 2023-09-25 DIAGNOSIS — Z79899 Other long term (current) drug therapy: Secondary | ICD-10-CM | POA: Diagnosis not present

## 2023-09-25 DIAGNOSIS — I739 Peripheral vascular disease, unspecified: Secondary | ICD-10-CM | POA: Diagnosis not present

## 2023-09-25 DIAGNOSIS — W19XXXA Unspecified fall, initial encounter: Secondary | ICD-10-CM | POA: Diagnosis not present

## 2023-09-25 DIAGNOSIS — N1832 Chronic kidney disease, stage 3b: Secondary | ICD-10-CM | POA: Diagnosis not present

## 2023-09-25 DIAGNOSIS — R4189 Other symptoms and signs involving cognitive functions and awareness: Secondary | ICD-10-CM | POA: Diagnosis not present

## 2023-09-25 DIAGNOSIS — M4802 Spinal stenosis, cervical region: Secondary | ICD-10-CM | POA: Diagnosis not present

## 2023-09-25 DIAGNOSIS — Z8673 Personal history of transient ischemic attack (TIA), and cerebral infarction without residual deficits: Secondary | ICD-10-CM | POA: Diagnosis not present

## 2023-09-25 DIAGNOSIS — H9193 Unspecified hearing loss, bilateral: Secondary | ICD-10-CM | POA: Diagnosis not present

## 2023-09-25 DIAGNOSIS — I10 Essential (primary) hypertension: Secondary | ICD-10-CM | POA: Diagnosis not present

## 2023-09-25 DIAGNOSIS — R1312 Dysphagia, oropharyngeal phase: Secondary | ICD-10-CM | POA: Diagnosis not present

## 2023-09-25 DIAGNOSIS — M25552 Pain in left hip: Secondary | ICD-10-CM | POA: Diagnosis not present

## 2023-09-25 DIAGNOSIS — K5900 Constipation, unspecified: Secondary | ICD-10-CM | POA: Diagnosis not present

## 2023-09-25 DIAGNOSIS — R279 Unspecified lack of coordination: Secondary | ICD-10-CM | POA: Diagnosis not present

## 2023-09-25 DIAGNOSIS — M6281 Muscle weakness (generalized): Secondary | ICD-10-CM | POA: Diagnosis not present

## 2023-09-25 DIAGNOSIS — J181 Lobar pneumonia, unspecified organism: Secondary | ICD-10-CM | POA: Diagnosis not present

## 2023-09-25 DIAGNOSIS — S79912A Unspecified injury of left hip, initial encounter: Secondary | ICD-10-CM | POA: Diagnosis not present

## 2023-09-25 DIAGNOSIS — R4701 Aphasia: Secondary | ICD-10-CM | POA: Diagnosis not present

## 2023-09-25 DIAGNOSIS — R627 Adult failure to thrive: Secondary | ICD-10-CM | POA: Diagnosis not present

## 2023-09-25 DIAGNOSIS — M79652 Pain in left thigh: Secondary | ICD-10-CM | POA: Diagnosis not present

## 2023-09-25 DIAGNOSIS — I517 Cardiomegaly: Secondary | ICD-10-CM | POA: Diagnosis not present

## 2023-09-25 DIAGNOSIS — I129 Hypertensive chronic kidney disease with stage 1 through stage 4 chronic kidney disease, or unspecified chronic kidney disease: Secondary | ICD-10-CM | POA: Diagnosis not present

## 2023-09-25 DIAGNOSIS — Z471 Aftercare following joint replacement surgery: Secondary | ICD-10-CM | POA: Diagnosis not present

## 2023-09-25 DIAGNOSIS — Z01818 Encounter for other preprocedural examination: Secondary | ICD-10-CM | POA: Diagnosis not present

## 2023-09-25 DIAGNOSIS — S72002A Fracture of unspecified part of neck of left femur, initial encounter for closed fracture: Secondary | ICD-10-CM | POA: Diagnosis not present

## 2023-09-25 DIAGNOSIS — Z7982 Long term (current) use of aspirin: Secondary | ICD-10-CM | POA: Diagnosis not present

## 2023-09-25 DIAGNOSIS — D72829 Elevated white blood cell count, unspecified: Secondary | ICD-10-CM | POA: Diagnosis not present

## 2023-09-25 DIAGNOSIS — R262 Difficulty in walking, not elsewhere classified: Secondary | ICD-10-CM | POA: Diagnosis not present

## 2023-09-25 DIAGNOSIS — I2510 Atherosclerotic heart disease of native coronary artery without angina pectoris: Secondary | ICD-10-CM | POA: Diagnosis not present

## 2023-09-25 LAB — CBC
HCT: 40 % (ref 39.0–52.0)
Hemoglobin: 13 g/dL (ref 13.0–17.0)
MCH: 33.4 pg (ref 26.0–34.0)
MCHC: 32.5 g/dL (ref 30.0–36.0)
MCV: 102.8 fL — ABNORMAL HIGH (ref 80.0–100.0)
Platelets: 203 K/uL (ref 150–400)
RBC: 3.89 MIL/uL — ABNORMAL LOW (ref 4.22–5.81)
RDW: 13 % (ref 11.5–15.5)
WBC: 9.9 K/uL (ref 4.0–10.5)
nRBC: 0 % (ref 0.0–0.2)

## 2023-09-25 LAB — BASIC METABOLIC PANEL WITH GFR
Anion gap: 9 (ref 5–15)
BUN: 19 mg/dL (ref 8–23)
CO2: 21 mmol/L — ABNORMAL LOW (ref 22–32)
Calcium: 8.1 mg/dL — ABNORMAL LOW (ref 8.9–10.3)
Chloride: 105 mmol/L (ref 98–111)
Creatinine, Ser: 1.4 mg/dL — ABNORMAL HIGH (ref 0.61–1.24)
GFR, Estimated: 50 mL/min — ABNORMAL LOW (ref >60)
Glucose, Bld: 91 mg/dL (ref 70–99)
Potassium: 4.2 mmol/L (ref 3.5–5.1)
Sodium: 135 mmol/L (ref 135–145)

## 2023-09-25 LAB — MAGNESIUM: Magnesium: 2 mg/dL (ref 1.7–2.4)

## 2023-09-25 MED ORDER — HYDROCODONE-ACETAMINOPHEN 5-325 MG PO TABS: 1.0000 | ORAL_TABLET | ORAL | 0 refills | Status: DC | PRN

## 2023-09-25 MED ORDER — BISACODYL 5 MG PO TBEC
5.0000 mg | DELAYED_RELEASE_TABLET | Freq: Every day | ORAL | 0 refills | Status: AC | PRN
Start: 2023-09-25 — End: ?

## 2023-09-25 MED ORDER — DOCUSATE SODIUM 100 MG PO CAPS: 100.0000 mg | ORAL_CAPSULE | Freq: Two times a day (BID) | ORAL | 0 refills | Status: DC

## 2023-09-25 MED ORDER — METHOCARBAMOL 500 MG PO TABS: 500.0000 mg | ORAL_TABLET | Freq: Four times a day (QID) | ORAL | 0 refills | Status: DC | PRN

## 2023-09-25 NOTE — Plan of Care (Signed)
  Problem: Education: Goal: Knowledge of General Education information will improve Description: Including pain rating scale, medication(s)/side effects and non-pharmacologic comfort measures Outcome: Progressing   Problem: Health Behavior/Discharge Planning: Goal: Ability to manage health-related needs will improve Outcome: Progressing   Problem: Activity: Goal: Risk for activity intolerance will decrease Outcome: Progressing   Problem: Elimination: Goal: Will not experience complications related to bowel motility Outcome: Progressing   Problem: Pain Managment: Goal: General experience of comfort will improve and/or be controlled Outcome: Progressing

## 2023-09-25 NOTE — Progress Notes (Signed)
 EOS  Pt participated in assessment Pt reports scheduled pain medication regiment adequate PRN Miralax  given Bowel sounds (+) gas (+)

## 2023-09-25 NOTE — NC FL2 (Signed)
 [place]  MEDICAID FL2 LEVEL OF CARE FORM     IDENTIFICATION  Patient Name: Jerry Rodriguez Birthdate: 22-Feb-1940 Sex: male Admission Date (Current Location): 09/21/2023  [facility] and [place] Number:  [employer] and Address:  [facility],  [address] [place] [address]      Provider Number: [id number]  Attending Physician Name and Address:  Maree Adron BIRCH, DO  Relative Name and Phone Number:       Current Level of Care: Hospital Recommended Level of Care: Skilled Nursing Facility Prior Approval Number:    Date Approved/Denied:   PASRR Number: [id number]  Discharge Plan: SNF    Current Diagnoses: Patient Active Problem List   Diagnosis Date Noted   Closed left hip fracture, initial encounter (HCC) 09/21/2023   Adult failure to thrive syndrome 09/21/2023   Benign hypertension with CKD (chronic kidney disease) stage III (HCC) 09/20/2023   Stage 3a chronic kidney disease (CKD) (HCC) 09/20/2023   Moderate cognitive impairment 09/20/2023   Moderate protein malnutrition (HCC) 09/20/2023   SIRS (systemic inflammatory response syndrome) (HCC) 06/10/2023   Lactic acidosis 06/10/2023   Transaminitis 06/10/2023   Hypophosphatemia 06/10/2023   History of CVA (cerebrovascular accident) 06/10/2023   Generalized weakness 03/18/2021   Elevated MCV 03/18/2021   Acute kidney injury superimposed on stage 4 chronic kidney disease (HCC) 03/18/2021   Essential hypertension 03/18/2021   Mixed hyperlipidemia 03/18/2021   Tobacco abuse 03/18/2021   Noncompliance with medication regimen 03/18/2021   Cervical stenosis of spinal canal 03/17/2021    Orientation RESPIRATION BLADDER Height & Weight     Self  Normal Incontinent Weight: 152 lb 12.5 oz (69.3 kg) Height:  5' 9 (175.3 cm)  BEHAVIORAL SYMPTOMS/MOOD NEUROLOGICAL BOWEL NUTRITION STATUS      Continent Diet (see D/C summary)  AMBULATORY STATUS COMMUNICATION OF NEEDS Skin   Extensive Assist Verbally  Normal                       Personal Care Assistance Level of Assistance  Bathing, Feeding, Dressing Bathing Assistance: Limited assistance Feeding assistance: Limited assistance Dressing Assistance: Limited assistance     Functional Limitations Info  Sight, Hearing, Speech Sight Info: Impaired Hearing Info: Impaired Speech Info: Adequate    SPECIAL CARE FACTORS FREQUENCY  PT (By licensed PT), OT (By licensed OT)     PT Frequency: 5 times weekly OT Frequency: 5 times weekly            Contractures Contractures Info: Not present    Additional Factors Info  Code Status, Allergies Code Status Info: DNR-Limited Allergies Info: NKA           Current Medications (09/25/2023):  This is the current hospital active medication list Current Facility-Administered Medications  Medication Dose Route Frequency Provider Last Rate Last Admin   acetaminophen  (TYLENOL ) tablet 650 mg  650 mg Oral Q6H PRN Margrette Taft BRAVO, MD       Or   acetaminophen  (TYLENOL ) suppository 650 mg  650 mg Rectal Q6H PRN Harrison, Stanley E, MD       acetaminophen  (TYLENOL ) tablet 325-650 mg  325-650 mg Oral Q6H PRN Harrison, Stanley E, MD       amLODipine  (NORVASC ) tablet 5 mg  5 mg Oral Daily Margrette Taft BRAVO, MD   5 mg at 09/25/23 1000   aspirin  chewable tablet 81 mg  81 mg Oral Q breakfast Margrette Taft BRAVO, MD   81 mg at 09/25/23 1000  atorvastatin  (LIPITOR) tablet 20 mg  20 mg Oral Daily Harrison, Stanley E, MD   20 mg at 09/25/23 1000   bisacodyl  (DULCOLAX) EC tablet 5 mg  5 mg Oral Daily PRN Margrette Taft BRAVO, MD       docusate sodium  (COLACE) capsule 100 mg  100 mg Oral BID Harrison, Stanley E, MD   100 mg at 09/25/23 1000   enoxaparin  (LOVENOX ) injection 40 mg  40 mg Subcutaneous Q24H Harrison, Stanley E, MD   40 mg at 09/25/23 1000   HYDROcodone -acetaminophen  (NORCO) 7.5-325 MG per tablet 1-2 tablet  1-2 tablet Oral Q4H PRN Harrison, Stanley E, MD       HYDROcodone -acetaminophen   (NORCO/VICODIN) 5-325 MG per tablet 1-2 tablet  1-2 tablet Oral Q4H PRN Harrison, Stanley E, MD   1 tablet at 09/23/23 2239   menthol -cetylpyridinium (CEPACOL) lozenge 3 mg  1 lozenge Oral PRN Harrison, Stanley E, MD       Or   phenol (CHLORASEPTIC) mouth spray 1 spray  1 spray Mouth/Throat PRN Margrette Taft BRAVO, MD       methocarbamol  (ROBAXIN ) tablet 500 mg  500 mg Oral Q6H PRN Margrette Taft BRAVO, MD       Or   methocarbamol  (ROBAXIN ) injection 500 mg  500 mg Intravenous Q6H PRN Margrette Taft BRAVO, MD       metoCLOPramide  (REGLAN ) tablet 5-10 mg  5-10 mg Oral Q8H PRN Margrette Taft BRAVO, MD       Or   metoCLOPramide  (REGLAN ) injection 5-10 mg  5-10 mg Intravenous Q8H PRN Harrison, Stanley E, MD       morphine  (PF) 2 MG/ML injection 0.5-1 mg  0.5-1 mg Intravenous Q2H PRN Margrette Taft BRAVO, MD       nicotine  (NICODERM CQ  - dosed in mg/24 hours) patch 21 mg  21 mg Transdermal Daily Maree, Pratik D, DO   21 mg at 09/25/23 1000   ondansetron  (ZOFRAN ) tablet 4 mg  4 mg Oral Q6H PRN Margrette Taft BRAVO, MD       Or   ondansetron  (ZOFRAN ) injection 4 mg  4 mg Intravenous Q6H PRN Harrison, Stanley E, MD   4 mg at 09/23/23 0009   Oral care mouth rinse  15 mL Mouth Rinse PRN Maree, Pratik D, DO       polyethylene glycol (MIRALAX  / GLYCOLAX ) packet 17 g  17 g Oral Daily PRN Margrette Taft BRAVO, MD   17 g at 09/24/23 2311   sodium phosphate (FLEET) enema 1 enema  1 enema Rectal Once PRN Harrison, Stanley E, MD       traMADol  (ULTRAM ) tablet 50 mg  50 mg Oral Q6H Harrison, Stanley E, MD   50 mg at 09/25/23 9475     Discharge Medications: Please see discharge summary for a list of discharge medications.  Relevant Imaging Results:  Relevant Lab Results:   Additional Information SSN: [id number], [place]

## 2023-09-25 NOTE — Discharge Summary (Signed)
 Physician Discharge Summary  Jerry Rodriguez FMW:[id number] DOB: [birth year] DOA: 09/21/2023  PCP: Landy Barnie RAMAN, NP  Admit date: 09/21/2023  Discharge date: 09/25/2023  Admitted From:Home  Disposition:  SNF  Recommendations for Outpatient Follow-up:  Follow up with PCP in 1-2 weeks Follow-up with orthopedics Dr. Margrette in 28 days Continue on DVT prophylaxis with aspirin  as prescribed for 30 days Pain medications provided as needed as well as muscle relaxers as noted below Continue other home medications as noted below  Home Health: None  Equipment/Devices: None  Discharge Condition:Stable  CODE STATUS: DNR  Diet recommendation: Heart Healthy  Brief/Interim Summary: Jerry Rodriguez is a 83 y.o. male with medical history significant for hypertension, CKD stage IIIa, mixed hyperlipidemia, dementia, history of CVA, moderate cognitive impairment, moderate protein calorie malnutrition, aortic atherosclerosis, tobacco abuse, and hearing difficulty who presented from his SNF after an unwitnessed fall on the morning of admission.  He was noted to have a left subcapital hip fracture and has been admitted for operative repair.  He underwent left hip hemiarthroplasty on 8/30 and tolerated the procedure well.  He was noted to have some somnolence and poor appetite shortly after the procedure, but is overall doing better and was awaiting SNF placement.  He now has bed placement available and is overall stable for discharge.  Discharge Diagnoses:  Principal Problem:   Closed left hip fracture, initial encounter [facility]) Active Problems:   Essential hypertension   Mixed hyperlipidemia   Tobacco abuse   History of CVA (cerebrovascular accident)   Benign hypertension with CKD (chronic kidney disease) stage III (HCC)   Moderate cognitive impairment   Moderate protein malnutrition (HCC)   Adult failure to thrive syndrome  Principal discharge diagnosis: Left subcapital hip fracture status post  fall with left hip hemiarthroplasty 8/30.  Discharge Instructions  Discharge Instructions     Diet - low sodium heart healthy   Complete by: As directed    If the dressing is still on your incision site when you go home, remove it on the third day after your surgery date. Remove dressing if it begins to fall off, or if it is dirty or damaged before the third day.   Complete by: As directed    Increase activity slowly   Complete by: As directed       Allergies as of 09/25/2023   No Known Allergies      Medication List     STOP taking these medications    lisinopril 20 MG tablet Commonly known as: ZESTRIL   ramipril  5 MG capsule Commonly known as: ALTACE        TAKE these medications    amLODipine  5 MG tablet Commonly known as: NORVASC  Take 1 tablet (5 mg total) by mouth daily.   aspirin  81 MG tablet Take 1 tablet (81 mg total) by mouth daily with breakfast.   atorvastatin  20 MG tablet Commonly known as: LIPITOR Take 20 mg by mouth daily.   bisacodyl  5 MG EC tablet Commonly known as: DULCOLAX Take 1 tablet (5 mg total) by mouth daily as needed for moderate constipation.   cholecalciferol 25 MCG (1000 UNIT) tablet Commonly known as: VITAMIN D3 Take 2,000 Units by mouth daily.   docusate sodium  100 MG capsule Commonly known as: COLACE Take 1 capsule (100 mg total) by mouth 2 (two) times daily.   HYDROcodone -acetaminophen  5-325 MG tablet Commonly known as: NORCO/VICODIN Take 1-2 tablets by mouth every 4 (four) hours as needed for moderate pain (pain  score 4-6) or severe pain (pain score 7-10).   ketoconazole 2 % cream Commonly known as: NIZORAL Apply 1 Application topically 2 (two) times daily.   methocarbamol  500 MG tablet Commonly known as: ROBAXIN  Take 1 tablet (500 mg total) by mouth every 6 (six) hours as needed for muscle spasms.   nicotine  21 mg/24hr patch Commonly known as: NICODERM CQ  - dosed in mg/24 hours Place 1 patch (21 mg total) onto the  skin daily.               Discharge Care Instructions  (From admission, onward)           Start     Ordered   09/25/23 0000  If the dressing is still on your incision site when you go home, remove it on the third day after your surgery date. Remove dressing if it begins to fall off, or if it is dirty or damaged before the third day.        09/25/23 1008            Follow-up Information     Margrette Taft BRAVO, MD. Go in 4 week(s).   Specialties: Orthopedic Surgery, Radiology Contact information: [address] [place] [address] [phone]                No Known Allergies  Consultations: Orthopedics   Procedures/Studies: DG HIP UNILAT WITH PELVIS 2-3 VIEWS LEFT Result Date: 09/22/2023 CLINICAL DATA:  Status post left hip replacement. EXAM: DG HIP (WITH OR WITHOUT PELVIS) 2-3V LEFT COMPARISON:  09/21/2023 FINDINGS: Left hip arthroplasty in expected alignment. No periprosthetic lucency or fracture. Recent postsurgical change includes air and edema in the soft tissues. IMPRESSION: Left hip arthroplasty without immediate postoperative complication. Electronically Signed   By: Andrea Gasman M.D.   On: 09/22/2023 14:47   DG Hip Unilat With Pelvis 2-3 Views Left Result Date: 09/21/2023 CLINICAL DATA:  Pain after fall. EXAM: DG HIP (WITH OR WITHOUT PELVIS) 2-3V LEFT COMPARISON:  CT abdomen/pelvis dated 06/09/2023. FINDINGS: Subcapital fracture of the left femoral head and neck junction with angulation and foreshortening. The left femoral head is seated in the acetabulum. Mild-to-moderate osteoarthritis of the bilateral hips. Sacroiliac joints and pubic symphysis appear anatomically aligned. IMPRESSION: Subcapital fracture of the left femoral head and neck junction with angulation and foreshortening. Electronically Signed   By: Harrietta Sherry M.D.   On: 09/21/2023 12:58   DG Hand Complete Left Result Date: 09/21/2023 CLINICAL DATA:  Pain after fall.  EXAM: LEFT HAND - COMPLETE 3+ VIEW COMPARISON:  None Available. FINDINGS: Flexion deformity at the fifth PIP joint, cannot exclude dislocation. No acute fracture identified. Mild-to-moderate diffuse interphalangeal joint space narrowing, most pronounced at the fourth DIP joint. Cutaneous irregularity of the tip of the first through third digits. Punctate radiodensities projecting over the distal second and fourth digits, near the level of the nailbed. IMPRESSION: 1. Flexion deformity at the fifth PIP joint, cannot exclude dislocation. No acute fracture identified. 2. Cutaneous irregularity of the tip of the first through third digits. Punctate radiodensities projecting over the distal second and fourth digits, near the level of the nailbed. Radiopaque foreign body is not excluded. Electronically Signed   By: Harrietta Sherry M.D.   On: 09/21/2023 12:55   DG Elbow Complete Left Result Date: 09/21/2023 CLINICAL DATA:  Pain after fall. EXAM: LEFT ELBOW - COMPLETE 3+ VIEW COMPARISON:  None Available. FINDINGS: There is no evidence of acute fracture, dislocation, or sizable joint effusion.  There is no evidence of significant arthropathy. Soft tissues are unremarkable. IMPRESSION: No acute fracture or dislocation of the left elbow. Electronically Signed   By: Harrietta Sherry M.D.   On: 09/21/2023 12:34   DG Chest 1 View Result Date: 09/21/2023 CLINICAL DATA:  Fall. EXAM: CHEST  1 VIEW COMPARISON:  06/09/2023. FINDINGS: The heart size and mediastinal contours are unchanged. Bilateral interstitial coarsening, could reflect edema or an infectious/inflammatory process. No focal consolidation, pleural effusion, or pneumothorax. Diffuse osseous demineralization. No acute osseous abnormality. IMPRESSION: Bilateral interstitial coarsening could reflect edema or an infectious/inflammatory process. Electronically Signed   By: Harrietta Sherry M.D.   On: 09/21/2023 12:33   CT Cervical Spine Wo Contrast Result Date:  09/21/2023 CLINICAL DATA:  83 year old male status post unwitnessed fall. Left head laceration. Pain. EXAM: CT CERVICAL SPINE WITHOUT CONTRAST TECHNIQUE: Multidetector CT imaging of the cervical spine was performed without intravenous contrast. Multiplanar CT image reconstructions were also generated. RADIATION DOSE REDUCTION: This exam was performed according to the departmental dose-optimization program which includes automated exposure control, adjustment of the mA and/or kV according to patient size and/or use of iterative reconstruction technique. COMPARISON:  Head CT today.  Cervical spine MRI 03/18/2021. FINDINGS: Alignment: Straightening of cervical lordosis appears to be chronic, stable. Mild dextroconvex cervical scoliosis. Cervicothoracic junction alignment is within normal limits. Bilateral posterior element alignment is within normal limits. 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 Visualized skull base is intact. No atlanto-occipital dissociation. Maintained C1-C2 alignment. Other cervical vertebrae appear intact. No acute osseous abnormality identified. Soft tissues and spinal canal: No prevertebral fluid or swelling. No visible canal hematoma. Mild for age calcified cervical carotid atherosclerosis. Disc levels: Chronic cervical spine degeneration, and pronounced chronic ligamentous hypertrophy at C1-C2 in the setting of ununited odontoid fracture or os odontoideum. Chronic C1 level cervical spinal stenosis, probably stable from 2023 MRI. Ordinary chronic disc and endplate degeneration elsewhere in the cervical spine. Up to mild additional spinal stenosis at those levels. Upper chest: Visible upper thoracic levels appear intact. Negative visible noncontrast thoracic inlet. IMPRESSION: 1. No acute traumatic injury identified in the cervical spine. 2. Chronic odontoid fragment and associated chronic severe C1-C2 spinal stenosis from ligamentous  hypertrophy. Similar appearance to a 2023 MRI. Electronically Signed   By: VEAR Hurst M.D.   On: 09/21/2023 12:16   CT Head Wo Contrast Result Date: 09/21/2023 CLINICAL DATA:  83 year old male status post unwitnessed fall. Left head laceration. Pain. EXAM: CT HEAD WITHOUT CONTRAST TECHNIQUE: Contiguous axial images were obtained from the base of the skull through the vertex without intravenous contrast. RADIATION DOSE REDUCTION: This exam was performed according to the departmental dose-optimization program which includes automated exposure control, adjustment of the mA and/or kV according to patient size and/or use of iterative reconstruction technique. COMPARISON:  Brain MRI,Head CT 03/17/2021. FINDINGS: Brain: Stable cerebral volume. Chronic encephalomalacia posterior right MCA territory. New but circumscribed and chronic appearing lacunar infarct anterior right external capsule, anterior right basal ganglia. No midline shift, ventriculomegaly, mass effect, evidence of mass lesion, intracranial hemorrhage or evidence of cortically based acute infarction. Vascular: Calcified atherosclerosis at the skull base. No suspicious intracranial vascular hyperdensity. Skull: Appears stable and intact. No acute osseous abnormality identified. Sinuses/Orbits: Visualized paranasal sinuses and mastoids are stable and well aerated. Other: No discrete orbit or scalp soft tissue injury identified. IMPRESSION: 1. No acute intracranial abnormality or acute traumatic injury identified. 2. Chronic posterior right MCA territory infarct. And chronic  but new since 2023 lacunar infarct at the anterior right external capsule. Electronically Signed   By: VEAR Hurst M.D.   On: 09/21/2023 12:12     Discharge Exam: Vitals:   09/25/23 0522 09/25/23 0956  BP: 134/86 139/79  Pulse: (!) 103 78  Resp:  18  Temp: 99.5 F (37.5 C) (!) 97.5 F (36.4 C)  SpO2: 93% 94%   Vitals:   09/24/23 1247 09/24/23 2125 09/25/23 0522 09/25/23 0956   BP: (!) 151/71 (!) 150/77 134/86 139/79  Pulse: 80 97 (!) 103 78  Resp: 16 18  18   Temp:  98.7 F (37.1 C) 99.5 F (37.5 C) (!) 97.5 F (36.4 C)  TempSrc:  Oral Oral Axillary  SpO2: 94% 98% 93% 94%  Weight:      Height:        General: Pt is alert, awake, not in acute distress Cardiovascular: RRR, S1/S2 +, no rubs, no gallops Respiratory: CTA bilaterally, no wheezing, no rhonchi Abdominal: Soft, NT, ND, bowel sounds + Extremities: no edema, no cyanosis    The results of significant diagnostics from this hospitalization (including imaging, microbiology, ancillary and laboratory) are listed below for reference.     Microbiology: Recent Results (from the past 240 hours)  MRSA Next Gen by PCR, Nasal     Status: None   Collection Time: 09/21/23  5:38 PM  Result Value Ref Range Status   MRSA by PCR Next Gen NOT DETECTED NOT DETECTED Final    Comment: (NOTE) The GeneXpert MRSA Assay (FDA approved for NASAL specimens only), is one component of a comprehensive MRSA colonization surveillance program. It is not intended to diagnose MRSA infection nor to guide or monitor treatment for MRSA infections. Test performance is not FDA approved in patients less than 16 years old. Performed at [facility], [address], [place], [place] [address]      Labs: BNP (last 3 results) No results for input(s): BNP in the last 8760 hours. Basic Metabolic Panel: Recent Labs  Lab 09/21/23 1102 09/22/23 0432 09/23/23 0444 09/24/23 0451 09/25/23 0415  NA 138 138 135 132* 135  K 4.1 4.2 4.0 4.1 4.2  CL 102 103 107 105 105  CO2 23 23 22  19* 21*  GLUCOSE 122* 116* 112* 92 91  BUN 20 17 16 19 19   CREATININE 1.69* 1.43* 1.56* 1.54* 1.40*  CALCIUM  9.5 9.5 8.1* 7.8* 8.1*  MG  --  2.2 1.7 1.9 2.0   Liver Function Tests: Recent Labs  Lab 09/20/23 0713 09/22/23 0432  AST 25 19  ALT 28 25  ALKPHOS 106 108  BILITOT 0.8 1.4*  PROT 6.6 7.0  ALBUMIN 2.8* 3.2*   No results for input(s):  LIPASE, AMYLASE in the last 168 hours. Recent Labs  Lab 09/24/23 1541  AMMONIA 21   CBC: Recent Labs  Lab 09/21/23 1102 09/22/23 0432 09/23/23 0444 09/24/23 0451 09/25/23 0415  WBC 8.8 10.1 11.3* 11.8* 9.9  NEUTROABS 6.7  --   --   --   --   HGB 15.8 15.8 13.1 12.7* 13.0  HCT 46.9 46.1 37.9* 39.5 40.0  MCV 99.4 98.7 98.4 105.1* 102.8*  PLT 294 259 209 164 203   Cardiac Enzymes: No results for input(s): CKTOTAL, CKMB, CKMBINDEX, TROPONINI in the last 168 hours. BNP: Invalid input(s): POCBNP CBG: Recent Labs  Lab 09/24/23 1237  GLUCAP 84   D-Dimer No results for input(s): DDIMER in the last 72 hours. Hgb A1c No results for input(s): HGBA1C  in the last 72 hours. Lipid Profile No results for input(s): CHOL, HDL, LDLCALC, TRIG, CHOLHDL, LDLDIRECT in the last 72 hours. Thyroid function studies No results for input(s): TSH, T4TOTAL, T3FREE, THYROIDAB in the last 72 hours.  Invalid input(s): FREET3 Anemia work up No results for input(s): VITAMINB12, FOLATE, FERRITIN, TIBC, IRON, RETICCTPCT in the last 72 hours. Urinalysis    Component Value Date/Time   COLORURINE YELLOW 03/17/2021 1825   APPEARANCEUR CLEAR 03/17/2021 1825   LABSPEC 1.045 (H) 03/17/2021 1825   PHURINE 5.0 03/17/2021 1825   GLUCOSEU NEGATIVE 03/17/2021 1825   HGBUR SMALL (A) 03/17/2021 1825   BILIRUBINUR NEGATIVE 03/17/2021 1825   KETONESUR NEGATIVE 03/17/2021 1825   PROTEINUR NEGATIVE 03/17/2021 1825   UROBILINOGEN 0.2 09/16/2010 0912   NITRITE NEGATIVE 03/17/2021 1825   LEUKOCYTESUR NEGATIVE 03/17/2021 1825   Sepsis Labs Recent Labs  Lab 09/22/23 0432 09/23/23 0444 09/24/23 0451 09/25/23 0415  WBC 10.1 11.3* 11.8* 9.9   Microbiology Recent Results (from the past 240 hours)  MRSA Next Gen by PCR, Nasal     Status: None   Collection Time: 09/21/23  5:38 PM  Result Value Ref Range Status   MRSA by PCR Next Gen NOT DETECTED NOT DETECTED  Final    Comment: (NOTE) The GeneXpert MRSA Assay (FDA approved for NASAL specimens only), is one component of a comprehensive MRSA colonization surveillance program. It is not intended to diagnose MRSA infection nor to guide or monitor treatment for MRSA infections. Test performance is not FDA approved in patients less than 22 years old. Performed at [facility], [address], [place], [place] [address]      Time coordinating discharge: 35 minutes  SIGNED:   Adron JONETTA Fairly, DO [facility] 09/25/2023, 10:22 AM  If 7PM-7AM, please contact night-coverage [email or web address]

## 2023-09-25 NOTE — Progress Notes (Signed)
 At 1211 gave nursing report to [facility] Nurse room 143

## 2023-09-25 NOTE — Care Management Important Message (Signed)
 Important Message  Patient Details  Name: Jerry Rodriguez MRN: [id number] Date of Birth: 07-Jun-1940   Important Message Given:  Yes - Medicare IM     Djuana Littleton L Tannie Koskela 09/25/2023, 11:30 AM

## 2023-09-25 NOTE — TOC Transition Note (Signed)
 Transition of Care [facility]) - Discharge Note   Patient Details  Name: Jerry Rodriguez MRN: [id number] Date of Birth: Feb 15, 1940  Transition of Care [facility]) CM/SW Contact:  Lucie Lunger, LCSWA Phone Number: 09/25/2023, 11:39 AM   Clinical Narrative:    CSW updated that pt is medically stable for D/C back to [facility] today. CSW spoke to pts son to update on plan for D/C. D/C clinicals sent to facility via HUB. CSW spoke to Tami with [facility] who states they are ready for pt to return. Room and report numbers provided to RN. TOC signing off.   Final next level of care: Skilled Nursing Facility Barriers to Discharge: Barriers Resolved   Patient Goals and CMS Choice Patient states their goals for this hospitalization and ongoing recovery are:: return to [facility] CMS Medicare.gov Compare Post Acute Care list provided to:: Patient Choice offered to / list presented to : Patient, Adult Children [employer] ownership interest in [facility]provided to:: Adult Children    Discharge Placement              Patient chooses bed at: [facility] Patient to be transferred to facility by: facility staff Name of family member notified: Son Patient and family notified of of transfer: 09/25/23  Discharge Plan and Services Additional resources added to the After Visit Summary for   In-house Referral: Clinical Social Work Discharge Planning Services: CM Consult Post Acute Care Choice: Skilled Nursing Facility                               Social Drivers of Health (SDOH) Interventions SDOH Screenings   Food Insecurity: Unknown (09/21/2023)  Housing: Patient Unable To Answer (09/21/2023)  Transportation Needs: Patient Unable To Answer (09/21/2023)  Utilities: Patient Unable To Answer (09/21/2023)  Social Connections: Moderately Integrated (09/21/2023)  Tobacco Use: High Risk (09/22/2023)     Readmission Risk Interventions    09/22/2023    2:24 PM  Readmission Risk Prevention Plan   Post Dischage Appt Complete  Medication Screening Complete  Transportation Screening Complete

## 2023-09-25 NOTE — Progress Notes (Signed)
 Physical Therapy Treatment Patient Details Name: Jerry Rodriguez MRN: [id number] DOB: April 23, 1940 Today's Date: 09/25/2023   History of Present Illness DHANVIN SZETO  has presented today for surgery, with the diagnosis of LEFT HIP FRACTURE.  The various methods of treatment have been discussed with the patient and family. After consideration of risks, benefits and other options for treatment, the patient has consented to  Procedure(s) with comments:  HEMIARTHROPLASTY (BIPOLAR) HIP, (Left) - DIRECT LATERAL APPROACH as a surgical intervention.  The patient's history has been reviewed, patient examined, no change in status, stable for surgery.  I have reviewed the patient's chart and labs.  Questions were answered to the patient's satisfaction.    PT Comments  Patient requires maxA for bed mobility tasks despite multiple attempts to use communication board to explain the benefit of sitting upright and to encourage participation. Due to pain in the left hip, the patient was unwilling to participate in transfer attempts and demonstrates poor tolerance for upright positioning with strong preference for posterior leaning.. Patient will benefit from continued skilled physical therapy in hospital and recommended venue below to increase strength, balance, endurance for safe ADLs and gait.     If plan is discharge home, recommend the following: A lot of help with bathing/dressing/bathroom;A lot of help with walking and/or transfers;Assistance with cooking/housework;Assistance with feeding;Direct supervision/assist for financial management;Help with stairs or ramp for entrance;Assist for transportation;Direct supervision/assist for medications management;Supervision due to cognitive status   Can travel by private vehicle     No  Equipment Recommendations  None recommended by PT    Recommendations for Other Services       Precautions / Restrictions Precautions Precautions: Other (comment) Recall of  Precautions/Restrictions: Impaired Restrictions Weight Bearing Restrictions Per Provider Order: No LLE Weight Bearing Per Provider Order: Weight bearing as tolerated     Mobility  Bed Mobility Overal bed mobility: Needs Assistance Bed Mobility: Supine to Sit     Supine to sit: Max assist     General bed mobility comments: maxA to achieve upright posture and scoot EOB, limited by LLE pain    Transfers                   General transfer comment: Unable to complete; leans back in bed, repeats to put his legs back once EOB    Ambulation/Gait                   Stairs             Wheelchair Mobility     Tilt Bed    Modified Rankin (Stroke Patients Only)       Balance Overall balance assessment: Needs assistance Sitting-balance support: Bilateral upper extremity supported Sitting balance-Leahy Scale: Poor Sitting balance - Comments: seated EOB Postural control: Posterior lean                                  Communication Communication Factors Affecting Communication: Hearing impaired;Difficulty expressing self  Cognition Arousal: Alert Behavior During Therapy: Anxious   PT - Cognitive impairments: History of cognitive impairments                       PT - Cognition Comments: Communicated with patient throughout writing Following commands: Impaired Following commands impaired: Follows one step commands inconsistently    Cueing Cueing Techniques: Verbal cues, Visual cues  Exercises  General Comments        Pertinent Vitals/Pain Pain Assessment Pain Assessment: PAINAD Breathing: normal Negative Vocalization: occasional moan/groan, low speech, negative/disapproving quality Facial Expression: facial grimacing Body Language: tense, distressed pacing, fidgeting Consolability: no need to console PAINAD Score: 4 Pain Location: left hip/leg Pain Descriptors / Indicators: Discomfort, Grimacing,  Guarding Pain Intervention(s): Limited activity within patient's tolerance, Monitored during session, Repositioned    Home Living                          Prior Function            PT Goals (current goals can now be found in the care plan section) Acute Rehab PT Goals Patient Stated Goal: to return to SNF PT Goal Formulation: With patient/family Time For Goal Achievement: 10/07/23 Potential to Achieve Goals: Good Progress towards PT goals: Progressing toward goals    Frequency    Min 5X/week      PT Plan      Co-evaluation              AM-PAC PT 6 Clicks Mobility   Outcome Measure  Help needed turning from your back to your side while in a flat bed without using bedrails?: A Lot Help needed moving from lying on your back to sitting on the side of a flat bed without using bedrails?: A Lot Help needed moving to and from a bed to a chair (including a wheelchair)?: A Lot Help needed standing up from a chair using your arms (e.g., wheelchair or bedside chair)?: A Lot Help needed to walk in hospital room?: A Lot Help needed climbing 3-5 steps with a railing? : Total 6 Click Score: 11    End of Session Equipment Utilized During Treatment: Gait belt Activity Tolerance: Patient limited by pain Patient left: in bed;with call bell/phone within reach;with bed alarm set Nurse Communication: Mobility status PT Visit Diagnosis: Unsteadiness on feet (R26.81);Other abnormalities of gait and mobility (R26.89);History of falling (Z91.81);Muscle weakness (generalized) (M62.81);Difficulty in walking, not elsewhere classified (R26.2)     Time: 9079-9054 PT Time Calculation (min) (ACUTE ONLY): 25 min  Charges:    $Therapeutic Activity: 23-37 mins PT General Charges $$ ACUTE PT VISIT: 1 Visit                     1:37 PM, 09/25/23,  Jmarion Christiano, SPT

## 2023-09-26 ENCOUNTER — Encounter: Payer: Self-pay | Admitting: Adult Health

## 2023-09-26 ENCOUNTER — Non-Acute Institutional Stay (SKILLED_NURSING_FACILITY): Payer: Self-pay | Admitting: Adult Health

## 2023-09-26 DIAGNOSIS — I700 Atherosclerosis of aorta: Secondary | ICD-10-CM

## 2023-09-26 DIAGNOSIS — E782 Mixed hyperlipidemia: Secondary | ICD-10-CM | POA: Diagnosis not present

## 2023-09-26 DIAGNOSIS — Z8673 Personal history of transient ischemic attack (TIA), and cerebral infarction without residual deficits: Secondary | ICD-10-CM

## 2023-09-26 DIAGNOSIS — M4802 Spinal stenosis, cervical region: Secondary | ICD-10-CM | POA: Diagnosis not present

## 2023-09-26 DIAGNOSIS — S72002G Fracture of unspecified part of neck of left femur, subsequent encounter for closed fracture with delayed healing: Secondary | ICD-10-CM | POA: Diagnosis not present

## 2023-09-26 DIAGNOSIS — R627 Adult failure to thrive: Secondary | ICD-10-CM

## 2023-09-26 DIAGNOSIS — I129 Hypertensive chronic kidney disease with stage 1 through stage 4 chronic kidney disease, or unspecified chronic kidney disease: Secondary | ICD-10-CM

## 2023-09-26 DIAGNOSIS — N1831 Chronic kidney disease, stage 3a: Secondary | ICD-10-CM | POA: Diagnosis not present

## 2023-09-26 DIAGNOSIS — R4189 Other symptoms and signs involving cognitive functions and awareness: Secondary | ICD-10-CM | POA: Diagnosis not present

## 2023-09-26 DIAGNOSIS — N183 Chronic kidney disease, stage 3 unspecified: Secondary | ICD-10-CM | POA: Diagnosis not present

## 2023-09-26 DIAGNOSIS — E440 Moderate protein-calorie malnutrition: Secondary | ICD-10-CM | POA: Diagnosis not present

## 2023-09-26 DIAGNOSIS — Z720 Tobacco use: Secondary | ICD-10-CM

## 2023-09-26 NOTE — Progress Notes (Signed)
 Location:  [facility]   Place of Service:  SNF (31)   CODE STATUS: DNR  No Known Allergies  Chief Complaint  Patient presents with   Hospitalization Follow-up    HPI:  He is a 83 year old resident of this facility who has been hospitalized from 09-21-23 through 09-25-23. His past medical history includes: hypertension; ckd stage 3a; dementia; history of cva; moderate cognitive impairment; protein calorie malnutrition. He had an unwitnessed fall and suffered a left subcapital hip fracture. He underwent a left hemiarthroplasty on 09-22-23. He is here for short term rehab with his goal to return back home at this time. He will continue to be followed for his chronic illnesses including: Benign hypertension with CKD (Chronic kidney disease) stage III: Stage 3a chronic kidney disease:  Mixed hyperlipidemia:   Past Medical History:  Diagnosis Date   Carotid artery occlusion    COPD (chronic obstructive pulmonary disease) (HCC)    Hypertension    Peripheral vascular disease (HCC)    Stroke [facility])     Past Surgical History:  Procedure Laterality Date   HIP ARTHROPLASTY Left 09/22/2023   Procedure: HEMIARTHROPLASTY (BIPOLAR) HIP, POSTERIOR APPROACH FOR FRACTURE;  Surgeon: Margrette Taft BRAVO, MD;  Location: [facility];  Service: Orthopedics;  Laterality: Left;  DIRECT LATERAL APPROACH    Social History   Socioeconomic History   Marital status: Married    Spouse name: Not on file   Number of children: Not on file   Years of education: Not on file   Highest education level: Not on file  Occupational History   Not on file  Tobacco Use   Smoking status: Every Day    Current packs/day: 1.00    Average packs/day: 1 pack/day for 60.0 years (60.0 ttl pk-yrs)    Types: Cigarettes   Smokeless tobacco: Never  Vaping Use   Vaping status: Never Used  Substance and Sexual Activity   Alcohol use: No    Alcohol/week: 0.0 standard drinks of alcohol   Drug use: No   Sexual activity: Not  on file  Other Topics Concern   Not on file  Social History Narrative   Not on file   Social Drivers of Health   Financial Resource Strain: Not on file  Food Insecurity: Unknown (09/21/2023)   Hunger Vital Sign    Worried About Running Out of Food in the Last Year: Not on file    Ran Out of Food in the Last Year: Patient unable to answer  Transportation Needs: Patient Unable To Answer (09/21/2023)   PRAPARE - Transportation    Lack of Transportation (Medical): Patient unable to answer    Lack of Transportation (Non-Medical): Patient unable to answer  Physical Activity: Not on file  Stress: Not on file  Social Connections: Moderately Integrated (09/21/2023)   Social Connection and Isolation Panel    Frequency of Communication with Friends and Family: Never    Frequency of Social Gatherings with Friends and Family: More than three times a week    Attends Religious Services: 1 to 4 times per year    Active Member of [employer] or Organizations: No    Attends Banker Meetings: Never    Marital Status: Married  Catering manager Violence: Patient Unable To Answer (09/21/2023)   Humiliation, Afraid, Rape, and Kick questionnaire    Fear of Current or Ex-Partner: Patient unable to answer    Emotionally Abused: Patient unable to answer    Physically Abused: Patient unable to  answer    Sexually Abused: Patient unable to answer   History reviewed. No pertinent family history.    VITAL SIGNS BP 120/76   Pulse 82   Temp 98 F (36.7 C)   Resp 20   Ht 5' 9 (1.753 m)   Wt 156 lb 12.8 oz (71.1 kg)   SpO2 98%   BMI 23.16 kg/m   Outpatient Encounter Medications as of 09/26/2023  Medication Sig   amLODipine  (NORVASC ) 5 MG tablet Take 1 tablet (5 mg total) by mouth daily.   aspirin  81 MG tablet Take 1 tablet (81 mg total) by mouth daily with breakfast.   atorvastatin  (LIPITOR) 20 MG tablet Take 20 mg by mouth daily.   bisacodyl  (DULCOLAX) 5 MG EC tablet Take 1 tablet (5 mg total)  by mouth daily as needed for moderate constipation.   cholecalciferol (VITAMIN D3) 25 MCG (1000 UNIT) tablet Take 2,000 Units by mouth daily.   HYDROcodone -acetaminophen  (NORCO/VICODIN) 5-325 MG tablet  Take 1 tablets by mouth every 6 (six) hours as needed for severe pain (pain score 7-10).)   methocarbamol  (ROBAXIN ) 500 MG tablet Take 1 tablet (500 mg total) by mouth every 6 (six) hours as needed for muscle spasms.   nicotine  (NICODERM CQ  - DOSED IN MG/24 HOURS) 21 mg/24hr patch Place 1 patch (21 mg total) onto the skin daily.   senna (SENOKOT) 8.6 MG tablet Take 1 tablet by mouth 2 (two) times daily.   docusate sodium  (COLACE) 100 MG capsule Take 1 capsule (100 mg total) by mouth 2 (two) times daily. (Patient not taking: Reported on 09/26/2023)   ketoconazole (NIZORAL) 2 % cream Apply 1 Application topically 2 (two) times daily. (Patient not taking: Reported on 09/26/2023)   No facility-administered encounter medications on file as of 09/26/2023.     SIGNIFICANT DIAGNOSTIC EXAMS  LABS  09-20-23: wbc 6.9; hgb 14.4; hct 42.6; mcv 100.9; plt 258; glucose 105; bun 25; creat 1.65; k+ 3.7; an++ 139; ca 9.1; gfr 41; protein 6.6 albumin 2.8; chol 168; ldl 121; trig 92 hdl 29 09-21-23: wbc 8.8; hgb 15.8; hct 46.9; mcv 99.4 plt 294 ;glucose 122; bun 20; creat 1.69; k+ 4.1; na++ 138; ca 9.5 gfr 40 09-24-23: wbc 9.9; hgb 13.0; hct 40.0; mcv 102.8 plt 203; glucose 91; bun 19; creat 1.40; k+ 4.2; na++ 135; ca 8.1 gfr 50; mag 2.0    Review of Systems  Reason unable to perform ROS: lethargic.    Physical Exam Constitutional:      General: He is not in acute distress.    Appearance: He is well-developed. He is not diaphoretic.  Neck:     Thyroid: No thyromegaly.  Cardiovascular:     Rate and Rhythm: Normal rate and regular rhythm.     Pulses: Normal pulses.     Heart sounds: Murmur heard.  Pulmonary:     Effort: Pulmonary effort is normal. No respiratory distress.     Breath sounds: Normal breath  sounds.  Abdominal:     General: Bowel sounds are normal. There is no distension.     Palpations: Abdomen is soft.     Tenderness: There is no abdominal tenderness.  Musculoskeletal:     Cervical back: Neck supple.     Right lower leg: No edema.     Left lower leg: No edema.     Comments:  left hemiarthroplasty on 09-22-23.  Lymphadenopathy:     Cervical: No cervical adenopathy.  Skin:    General: Skin is warm  and dry.  Neurological:     Mental Status: He is alert. Mental status is at baseline.  Psychiatric:        Mood and Affect: Mood normal.      ASSESSMENT/ PLAN:  TODAY  Closed left hip fracture with delayed healing subsequent encounter: has vicodin 5.325 mg every 6 hours as needed through 09-28-23 and has robaxin  500 mg every 6 hours as needed; will continue therapy as directed to improve upon his level of independence with his adl care.   2. Benign hypertension with CKD (Chronic kidney disease) stage III: b/p 132/74: will continue altace  5 mg daily; norvasc  5 mg daily   3. Stage 3a chronic kidney disease: bun 25; creat 1.65 gfr 41  4. Mixed hyperlipidemia: ldl 121 will continue lipitor 20 mg daily   5. History of CVA (cerebral vascular disease) will continue asa 81 mg daily   6. Moderate cognitive impairment: did not know year or place; has white matter disease on ct scan  7. Moderate protein calorie malnutrition: albumin 2.8 will continue prostat twice daily   8. Aortic atherosclerosis (ct 06-09-23) is on asa and statin  9. Smoker: is on nicoderm patch 21 mg daily    Jerry Seip NP [facility] Adult Medicine  call [phone]

## 2023-09-27 ENCOUNTER — Encounter: Payer: Self-pay | Admitting: Internal Medicine

## 2023-09-27 ENCOUNTER — Other Ambulatory Visit: Payer: Self-pay | Admitting: *Deleted

## 2023-09-27 ENCOUNTER — Non-Acute Institutional Stay (SKILLED_NURSING_FACILITY): Payer: Self-pay | Admitting: Internal Medicine

## 2023-09-27 DIAGNOSIS — S72002G Fracture of unspecified part of neck of left femur, subsequent encounter for closed fracture with delayed healing: Secondary | ICD-10-CM | POA: Diagnosis not present

## 2023-09-27 DIAGNOSIS — R718 Other abnormality of red blood cells: Secondary | ICD-10-CM

## 2023-09-27 DIAGNOSIS — N1831 Chronic kidney disease, stage 3a: Secondary | ICD-10-CM

## 2023-09-27 DIAGNOSIS — E440 Moderate protein-calorie malnutrition: Secondary | ICD-10-CM | POA: Diagnosis not present

## 2023-09-27 DIAGNOSIS — R4189 Other symptoms and signs involving cognitive functions and awareness: Secondary | ICD-10-CM | POA: Diagnosis not present

## 2023-09-27 NOTE — Patient Instructions (Signed)
 See assessment and plan under each diagnosis in the problem list and acutely for this visit

## 2023-09-27 NOTE — Progress Notes (Signed)
   NURSING HOME LOCATION:  [facility] ROOM NUMBER:  [id number] P  CODE STATUS:  DNR  PCP: Gaither Langton MD  This is a nursing facility follow up visit for Nursing Facility readmission within 30 days.  Interim medical record and care since last SNF visit was updated with review of diagnostic studies and change in clinical status since last visit were documented.  HPI: He was readmitted to the hospital 8/29 - 09/25/2023 after sustaining a left subcapital hip fracture in an unwitnessed fall from the bed 8/29 at the SNF.  Left hemiarthroplasty was completed 8/30.  Postop complication included anorexia and somnolence. Initial creatinine was 1.69 with a GFR of 40; final values were 1.40/50 indicating CKD stage IIIa.  H/H dropped from 15.8/46.1 down to 13/40.  MCV was 102.8.  Last B12 on record was 137 on 03/18/2021.  While hospitalized glucoses ranged from 91 up to 122. Postop he was felt to be stable enough and pain control adequate that he was discharged back to the SNF to continue rehab.  Review of systems: Dementia invalidated responses.  He was essentially nonverbal for the most part.  When asked why he had been hospitalized his response was fell, cracked my hip.  He was essentially nonverbal during the visit and would not follow commands to facilitate exam.  For instance he would not open his mouth for intraoral exam. His wife was present and she stated that he has become increasingly oppositional at home. She states that basically he will only eat chicken tenders and mashed sweet potatoes.  He has exhibited stool and bladder incontinence.  She states that he has been noncompliant with his medications and with follow-up with his PCP. She did not validate OSA but stated that he did snore and snort while asleep.  Physical exam:  Pertinent or positive findings: He appears his age and chronically ill.  He was markedly lethargic.  Facies were blank even when his eyes were open.  He has a  beard and mustache.  Breath sounds are decreased.  Heart sounds are distant and rhythm is irregular.  Pedal pulses are decreased.  When the feet were examined the toes are upgoing.  Bruising was noted over the left hand.  There is trace edema at the ankle medially.  General appearance:  no acute distress, increased work of breathing is present.   Lymphatic: No lymphadenopathy about the head, neck, axilla. Eyes: No conjunctival inflammation or lid edema is present. There is no scleral icterus. Ears:  External ear exam shows no significant lesions or deformities.   Nose:  External nasal examination shows no deformity or inflammation. Nasal mucosa are pink and moist without lesions, exudates Neck:  No thyromegaly, masses, tenderness noted.    Heart:  No gallop, murmur, click, rub .  Lungs:  without wheezes, rhonchi, rales, rubs. Abdomen: Bowel sounds are normal. Abdomen is soft and nontender with no organomegaly, hernias, masses. GU: Deferred  Extremities:  No cyanosis, clubbing  Neurologic exam :Balance, Rhomberg, finger to nose testing could not be completed due to clinical state Deep tendon reflexes are equal Skin: Warm & dry w/o tenting. No significant rash.  See summary under each active problem in the Problem List with associated updated therapeutic plan

## 2023-09-27 NOTE — Patient Outreach (Signed)
 Mr. Balboa resides in [facility]. [facility] SNF waiver previously utilized for SNF admission. Mr. Kina recently returned to [facility] after hospital readmission.   Writer will collaborate with [facility] social worker on transition planning, addressing needs, and identifying potential barriers.  Will continue to follow.   Pablo Hurst, MSN, RN, BSN [facility], [facility] RN Post- Acute Care Manager Direct Dial: [phone]

## 2023-09-28 NOTE — Assessment & Plan Note (Addendum)
 While hospitalized 8/29 - 09/25/2023 CKD remained in this stage III range but did improve to stage IIIa by discharge.  Med list reviewed; no indication for change in meds or dosages.

## 2023-09-28 NOTE — Assessment & Plan Note (Addendum)
 His wife reports that he has been noncompliant with medical follow-up and medication administration.  She describes increasingly oppositional behavior PTA. I have reviewed the CNS imaging results with her and discussed the pathophysiology of vascular dementia and his poor prognosis.  She expressed appreciation for the interaction. It is doubtful that he will be able to benefit from PT/OT in the context of his dementia. It is unlikely he will be able to complete the MMSE; the dementia clinically is very severe rather than moderate.

## 2023-09-28 NOTE — Assessment & Plan Note (Addendum)
 It is doubtful he will be able to achieve significant benefit from PT/OT due to severe neurocognitive deficits suggested to be of vascular etiology.  He continues to try to mobilize without help ,resulting in recurrent falls. Staff has had to resort to keeping him placed in the wheelchair at the nurses station for direct observation.

## 2023-09-28 NOTE — Assessment & Plan Note (Signed)
 His wife validates that prior to admission nutritional habits were extremely poor, consisting mainly of chicken tenders and mashed sweet potatoes.

## 2023-09-28 NOTE — Assessment & Plan Note (Addendum)
 Update B12 with next blood draw.  Also draw folic acid  level as his wife describes chronic, extremely poor nutrition habits PTA.

## 2023-10-01 ENCOUNTER — Other Ambulatory Visit (HOSPITAL_COMMUNITY)
Admission: RE | Admit: 2023-10-01 | Discharge: 2023-10-01 | Disposition: A | Discharge status: Home / Self Care | Source: Skilled Nursing Facility | Attending: Adult Health | Admitting: Adult Health

## 2023-10-01 DIAGNOSIS — S72002G Fracture of unspecified part of neck of left femur, subsequent encounter for closed fracture with delayed healing: Secondary | ICD-10-CM | POA: Insufficient documentation

## 2023-10-01 LAB — FOLATE: Folate: 6 ng/mL (ref >5.9)

## 2023-10-01 LAB — VITAMIN B12: Vitamin B-12: 320 pg/mL (ref 180–914)

## 2023-10-02 DIAGNOSIS — I700 Atherosclerosis of aorta: Secondary | ICD-10-CM | POA: Insufficient documentation

## 2023-10-09 ENCOUNTER — Other Ambulatory Visit: Payer: Self-pay | Admitting: *Deleted

## 2023-10-09 NOTE — Patient Outreach (Signed)
 Mr. Leite resides in [facility]. [facility] SNF waiver utilized for admission.  Collaboration with Kirke, SNF social worker. Mr Kenley plans to return home with spouse. Family meeting scheduled for this Friday.   Will continue to follow.   Pablo Hurst, MSN, RN, BSN [place]  [facility], [facility] Commun[facility] RN Post- Acute Care Manager Direct Dial: [phone]

## 2023-10-14 ENCOUNTER — Emergency Department (HOSPITAL_COMMUNITY)

## 2023-10-14 ENCOUNTER — Emergency Department (HOSPITAL_COMMUNITY)
Admission: EM | Admit: 2023-10-14 | Discharge: 2023-10-14 | Disposition: A | Discharge status: Home / Self Care | Source: Skilled Nursing Facility | Attending: Emergency Medicine | Admitting: Emergency Medicine

## 2023-10-14 ENCOUNTER — Other Ambulatory Visit (HOSPITAL_COMMUNITY)
Admission: RE | Admit: 2023-10-14 | Discharge: 2023-10-14 | Disposition: A | Discharge status: Home / Self Care | Source: Skilled Nursing Facility | Attending: Adult Health | Admitting: Adult Health

## 2023-10-14 ENCOUNTER — Encounter (HOSPITAL_COMMUNITY): Payer: Self-pay

## 2023-10-14 ENCOUNTER — Other Ambulatory Visit: Payer: Self-pay

## 2023-10-14 DIAGNOSIS — Z9889 Other specified postprocedural states: Secondary | ICD-10-CM | POA: Diagnosis not present

## 2023-10-14 DIAGNOSIS — D72829 Elevated white blood cell count, unspecified: Secondary | ICD-10-CM | POA: Insufficient documentation

## 2023-10-14 DIAGNOSIS — J181 Lobar pneumonia, unspecified organism: Secondary | ICD-10-CM | POA: Insufficient documentation

## 2023-10-14 DIAGNOSIS — H913 Deaf nonspeaking, not elsewhere classified: Secondary | ICD-10-CM | POA: Insufficient documentation

## 2023-10-14 DIAGNOSIS — M79652 Pain in left thigh: Secondary | ICD-10-CM | POA: Diagnosis not present

## 2023-10-14 DIAGNOSIS — Z471 Aftercare following joint replacement surgery: Secondary | ICD-10-CM | POA: Diagnosis not present

## 2023-10-14 DIAGNOSIS — R000 Tachycardia, unspecified: Secondary | ICD-10-CM | POA: Diagnosis not present

## 2023-10-14 DIAGNOSIS — I517 Cardiomegaly: Secondary | ICD-10-CM | POA: Diagnosis not present

## 2023-10-14 DIAGNOSIS — J189 Pneumonia, unspecified organism: Secondary | ICD-10-CM

## 2023-10-14 DIAGNOSIS — Z96642 Presence of left artificial hip joint: Secondary | ICD-10-CM | POA: Diagnosis not present

## 2023-10-14 DIAGNOSIS — Z01818 Encounter for other preprocedural examination: Secondary | ICD-10-CM | POA: Diagnosis not present

## 2023-10-14 DIAGNOSIS — Z7982 Long term (current) use of aspirin: Secondary | ICD-10-CM | POA: Insufficient documentation

## 2023-10-14 DIAGNOSIS — R918 Other nonspecific abnormal finding of lung field: Secondary | ICD-10-CM | POA: Diagnosis not present

## 2023-10-14 DIAGNOSIS — W19XXXA Unspecified fall, initial encounter: Secondary | ICD-10-CM | POA: Diagnosis not present

## 2023-10-14 DIAGNOSIS — M25552 Pain in left hip: Secondary | ICD-10-CM | POA: Diagnosis not present

## 2023-10-14 DIAGNOSIS — S79922A Unspecified injury of left thigh, initial encounter: Secondary | ICD-10-CM | POA: Diagnosis not present

## 2023-10-14 DIAGNOSIS — Z79899 Other long term (current) drug therapy: Secondary | ICD-10-CM | POA: Diagnosis not present

## 2023-10-14 LAB — CBC WITH DIFFERENTIAL/PLATELET
Abs Immature Granulocytes: 0.07 K/uL (ref 0.00–0.07)
Basophils Absolute: 0 K/uL (ref 0.0–0.1)
Basophils Relative: 0 %
Eosinophils Absolute: 0 K/uL (ref 0.0–0.5)
Eosinophils Relative: 0 %
HCT: 42.1 % (ref 39.0–52.0)
Hemoglobin: 14 g/dL (ref 13.0–17.0)
Immature Granulocytes: 1 %
Lymphocytes Relative: 6 %
Lymphs Abs: 0.8 K/uL (ref 0.7–4.0)
MCH: 33.3 pg (ref 26.0–34.0)
MCHC: 33.3 g/dL (ref 30.0–36.0)
MCV: 100.2 fL — ABNORMAL HIGH (ref 80.0–100.0)
Monocytes Absolute: 0.8 K/uL (ref 0.1–1.0)
Monocytes Relative: 6 %
Neutro Abs: 13.2 K/uL — ABNORMAL HIGH (ref 1.7–7.7)
Neutrophils Relative %: 87 %
Platelets: 277 K/uL (ref 150–400)
RBC: 4.2 MIL/uL — ABNORMAL LOW (ref 4.22–5.81)
RDW: 13.8 % (ref 11.5–15.5)
WBC: 15 K/uL — ABNORMAL HIGH (ref 4.0–10.5)
nRBC: 0 % (ref 0.0–0.2)

## 2023-10-14 LAB — PROTIME-INR
INR: 1 (ref 0.8–1.2)
Prothrombin Time: 13.4 s (ref 11.4–15.2)

## 2023-10-14 LAB — BASIC METABOLIC PANEL WITH GFR
Anion gap: 13 (ref 5–15)
BUN: 30 mg/dL — ABNORMAL HIGH (ref 8–23)
CO2: 23 mmol/L (ref 22–32)
Calcium: 9.4 mg/dL (ref 8.9–10.3)
Chloride: 104 mmol/L (ref 98–111)
Creatinine, Ser: 1.73 mg/dL — ABNORMAL HIGH (ref 0.61–1.24)
GFR, Estimated: 39 mL/min — ABNORMAL LOW (ref >60)
Glucose, Bld: 135 mg/dL — ABNORMAL HIGH (ref 70–99)
Potassium: 4.8 mmol/L (ref 3.5–5.1)
Sodium: 140 mmol/L (ref 135–145)

## 2023-10-14 LAB — TYPE AND SCREEN
ABO/RH(D): O POS
Antibody Screen: NEGATIVE

## 2023-10-14 MED ORDER — AZITHROMYCIN 250 MG PO TABS
250.0000 mg | ORAL_TABLET | Freq: Every day | ORAL | 0 refills | Status: DC
Start: 2023-10-14 — End: 2023-10-17

## 2023-10-14 MED ORDER — FENTANYL CITRATE (PF) 100 MCG/2ML IJ SOLN: 50.0000 ug | INTRAMUSCULAR | Status: DC | PRN

## 2023-10-14 MED ORDER — AMOXICILLIN-POT CLAVULANATE 875-125 MG PO TABS
1.0000 | ORAL_TABLET | Freq: Two times a day (BID) | ORAL | 0 refills | Status: DC
Start: 2023-10-14 — End: 2023-10-17

## 2023-10-14 MED ORDER — AMOXICILLIN-POT CLAVULANATE 875-125 MG PO TABS
1.0000 | ORAL_TABLET | Freq: Once | ORAL | Status: AC
Start: 2023-10-14 — End: 2023-10-14
  Administered 2023-10-14: 1 via ORAL
  Filled 2023-10-14: qty 1

## 2023-10-14 NOTE — ED Triage Notes (Signed)
 Pt [facility] EMS for unwitnessed Fall; pt. C/o left hip pain; external rotation noted in left foot.. Pt found under a wheelchair. Pt denies head injury. Pt is deaf and nonverbal. Can communicate with whiteboard in limited manner. Hx limited.

## 2023-10-14 NOTE — ED Provider Notes (Signed)
 [place] EMERGENCY DEPARTMENT AT [facility] Provider Note   CSN: [id number] Arrival date & time: 10/14/23  1614     Patient presents with: Jerry Rodriguez is a 83 y.o. male.    Fall   This patient is an 83 year old male, he is on a baby aspirin , has history of a prior hip fracture reported by EMS, he is currently at the [facility] and was found underneath his wheelchair by staff who did not witness a fall.  The patient denies hitting his head, he complains of pain in his left hip and was not able to get up by himself.  The patient had fallen approximately 3-1/2 weeks ago during which time he suffered a subcapital fracture of the left femoral head and neck junction.  This was surgically repaired by Dr. Margrette    Prior to Admission medications   Medication Sig Start Date End Date Taking? Authorizing Provider  amoxicillin -clavulanate (AUGMENTIN ) 875-125 MG tablet Take 1 tablet by mouth every 12 (twelve) hours. 10/14/23  Yes Cleotilde Rogue, MD  azithromycin  (ZITHROMAX  Z-PAK) 250 MG tablet Take 1 tablet (250 mg total) by mouth daily. 500mg  PO day 1, then 250mg  PO days 205 10/14/23  Yes Cleotilde Rogue, MD  amLODipine  (NORVASC ) 5 MG tablet Take 1 tablet (5 mg total) by mouth daily. 06/12/23   Pearlean Manus, MD  aspirin  81 MG tablet Take 1 tablet (81 mg total) by mouth daily with breakfast. 06/12/23   Emokpae, Courage, MD  atorvastatin  (LIPITOR) 20 MG tablet Take 20 mg by mouth daily. 02/15/15   [provider]  bisacodyl  (DULCOLAX) 5 MG EC tablet Take 1 tablet (5 mg total) by mouth daily as needed for moderate constipation. 09/25/23   Maree, Pratik D, DO  cholecalciferol (VITAMIN D3) 25 MCG (1000 UNIT) tablet Take 2,000 Units by mouth daily.    [provider]  docusate sodium  (COLACE) 100 MG capsule Take 1 capsule (100 mg total) by mouth 2 (two) times daily. Patient not taking: Reported on 09/26/2023 09/25/23   Maree Bracken D, DO  HYDROcodone -acetaminophen   (NORCO/VICODIN) 5-325 MG tablet Take 1-2 tablets by mouth every 4 (four) hours as needed for moderate pain (pain score 4-6) or severe pain (pain score 7-10). Patient taking differently: Take 1-2 tablets by mouth every 6 (six) hours as needed for moderate pain (pain score 4-6) or severe pain (pain score 7-10). 09/25/23   Maree, Pratik D, DO  ketoconazole (NIZORAL) 2 % cream Apply 1 Application topically 2 (two) times daily. Patient not taking: Reported on 09/26/2023 06/22/23   [provider]  methocarbamol  (ROBAXIN ) 500 MG tablet Take 1 tablet (500 mg total) by mouth every 6 (six) hours as needed for muscle spasms. 09/25/23   Maree, Pratik D, DO  nicotine  (NICODERM CQ  - DOSED IN MG/24 HOURS) 21 mg/24hr patch Place 1 patch (21 mg total) onto the skin daily. 06/13/23   Pearlean Manus, MD  senna (SENOKOT) 8.6 MG tablet Take 1 tablet by mouth 2 (two) times daily.    [provider]    Allergies: Patient has no known allergies.    Review of Systems  All other systems reviewed and are negative.   Updated Vital Signs BP 102/63   Pulse (!) 104   Temp 98.9 F (37.2 C) (Axillary)   Resp (!) 29   Ht 1.803 m (5' 11)   Wt 70 kg   SpO2 97%   BMI 21.52 kg/m   Physical Exam Vitals and  nursing note reviewed.  Constitutional:      General: He is not in acute distress.    Appearance: He is well-developed.  HENT:     Head: Normocephalic and atraumatic.     Nose: Nose normal.     Mouth/Throat:     Pharynx: No oropharyngeal exudate.  Eyes:     General: No scleral icterus.       Right eye: No discharge.        Left eye: No discharge.     Conjunctiva/sclera: Conjunctivae normal.     Pupils: Pupils are equal, round, and reactive to light.  Neck:     Thyroid: No thyromegaly.     Vascular: No JVD.  Cardiovascular:     Rate and Rhythm: Normal rate and regular rhythm.     Heart sounds: Normal heart sounds. No murmur heard.    No friction rub. No gallop.  Pulmonary:     Effort:  Pulmonary effort is normal. No respiratory distress.     Breath sounds: Normal breath sounds. No wheezing or rales.  Abdominal:     General: Bowel sounds are normal. There is no distension.     Palpations: Abdomen is soft. There is no mass.     Tenderness: There is no abdominal tenderness.  Musculoskeletal:        General: Tenderness present.     Cervical back: Normal range of motion and neck supple.     Right lower leg: No edema.     Left lower leg: No edema.     Comments: The patient had shortening and external rotation of the left lower extremity, there is pain with any range of motion of the hip  Lymphadenopathy:     Cervical: No cervical adenopathy.  Skin:    General: Skin is warm and dry.     Findings: No erythema or rash.  Neurological:     General: No focal deficit present.     Mental Status: He is alert.     Coordination: Coordination normal.  Psychiatric:        Behavior: Behavior normal.     (all labs ordered are listed, but only abnormal results are displayed) Labs Reviewed  BASIC METABOLIC PANEL WITH GFR - Abnormal; Notable for the following components:      Result Value   Glucose, Bld 135 (*)    BUN 30 (*)    Creatinine, Ser 1.73 (*)    GFR, Estimated 39 (*)    All other components within normal limits  CBC WITH DIFFERENTIAL/PLATELET - Abnormal; Notable for the following components:   WBC 15.0 (*)    RBC 4.20 (*)    MCV 100.2 (*)    Neutro Abs 13.2 (*)    All other components within normal limits  PROTIME-INR  URINALYSIS, W/ REFLEX TO CULTURE (INFECTION SUSPECTED)  TYPE AND SCREEN    EKG: EKG Interpretation Date/Time:  Sunday October 14 2023 16:25:00 EDT Ventricular Rate:  115 PR Interval:  180 QRS Duration:  90 QT Interval:  303 QTC Calculation: 419 R Axis:   27  Text Interpretation: Sinus tachycardia Multiple ventricular premature complexes Nonspecific T abnormalities, lateral leads Confirmed by Cleotilde Rogue ([id number]) on 10/14/2023 4:30:02  PM  Radiology: [facility] Femur Min 2 Views Left Result Date: 10/14/2023 CLINICAL DATA:  Femur injury, recent hip surgery. Fall with left hip pain. EXAM: LEFT FEMUR 2 VIEWS COMPARISON:  10/14/2023. FINDINGS: There is no evidence of acute fracture or dislocation. Total hip arthroplasty changes are noted  on the left. Degenerative changes are present at the knee. Vascular calcifications are seen in the soft tissues. IMPRESSION: Status post left hip replacement with no evidence of acute fracture, dislocation, or hardware loosening. Electronically Signed   By: Leita Birmingham M.D.   On: 10/14/2023 17:56   DG Chest Port 1 View Result Date: 10/14/2023 CLINICAL DATA:  Unwitnessed fall.  Preop. EXAM: PORTABLE CHEST 1 VIEW COMPARISON:  09/21/2023 FINDINGS: Lungs are hypoinflated with mild hazy density over the left base/retrocardiac region as cannot exclude atelectasis/effusion or possible infection. Subtle hazy prominence of the central pulmonary vessels likely minimal vascular congestion which is slightly improved. Mild stable cardiomegaly. Remainder of the exam is unchanged. IMPRESSION: 1. Hypoinflation with mild hazy density over the left base/retrocardiac region which may reflect atelectasis/effusion or possible infection. Consider PA and lateral chest radiograph for better evaluation of the left lung base. 2. Mild stable cardiomegaly with suggestion of minimal vascular congestion. Electronically Signed   By: Toribio Agreste M.D.   On: 10/14/2023 17:36   DG Hip Unilat With Pelvis 2-3 Views Left Result Date: 10/14/2023 CLINICAL DATA:  Left hip pain, fall. EXAM: DG HIP (WITH OR WITHOUT PELVIS) 2-3V LEFT COMPARISON:  09/22/2023 FINDINGS: Prior left hip replacement. No acute bony abnormality. Specifically, no fracture, subluxation, or dislocation. IMPRESSION: No acute bony abnormality. Electronically Signed   By: Franky Crease M.D.   On: 10/14/2023 17:36     Procedures   Medications Ordered in the ED  fentaNYL   (SUBLIMAZE ) injection 50 mcg (has no administration in time range)  amoxicillin -clavulanate (AUGMENTIN ) 875-125 MG per tablet 1 tablet (has no administration in time range)    Clinical Course as of 10/14/23 1847  Sun Oct 14, 2023  1659 DG Chest Addy 1 View [BM]    Clinical Course User Index [BM] Cleotilde Rogue, MD                                 Medical Decision Making Amount and/or Complexity of Data Reviewed Labs: ordered. Radiology: ordered. Decision-making details documented in ED Course.  Risk Prescription drug management.   The patient has no signs of head injury He cannot tell us  why he fell but does not appear to be in any discomfort and has only complaints of his left hip. Will have to image the hip to make sure there is no fracture or dislocation of his hip Patient is agreeable to the plan, fentanyl  ordered  Labs:  I  personally viewed and interpreted the labs which show creatinine seems to be close to baseline, electrolytes are unremarkable, CBC shows a slight leukocytosis of about 15,000   Radiology Imaging: I personally viewed the images of the ordered radiographic studies and find no signs of hip fracture or dislocation, pelvic x-ray appears normal and the femur x-ray appears normal, x-ray of the chest shows no signs of acute infiltrate other than a possible bit of atelectasis effusion or infection at the left base.  The patient is not hypoxic but is mildly tachypneic I agree with the radiologist interpretation as well  Meds / Interventions: while in the ED the patient received the following: Augmentin  The response to the interventions was that the patient continued to be stable  Family aware at the bedside, treatment for pneumonia, stable for discharge back to facility       Final diagnoses:  Fall, initial encounter  Community acquired pneumonia of left lower lobe of lung  ED Discharge Orders          Ordered    amoxicillin -clavulanate (AUGMENTIN )  875-125 MG tablet  Every 12 hours        10/14/23 1847    azithromycin  (ZITHROMAX  Z-PAK) 250 MG tablet  Daily        10/14/23 1847               Cleotilde Rogue, MD 10/14/23 [id number]

## 2023-10-14 NOTE — Discharge Instructions (Addendum)
 Thankfully no signs of broken bones, blood work was reassuring, continue to take your medications including your Tylenol  as needed, it appears that you do have Vicodin on your medication list, if you need something for severe pain you can take this.  Feel free to return to the ER as needed for severe or worsening symptoms  The x-ray of your chest did show a little bit of pneumonia at the bottom of your left lung, Augmentin  twice a day, Zithromax  daily for 5 days

## 2023-10-15 LAB — URINALYSIS, W/ REFLEX TO CULTURE (INFECTION SUSPECTED)
Bilirubin Urine: NEGATIVE
Glucose, UA: NEGATIVE mg/dL
Hgb urine dipstick: NEGATIVE
Ketones, ur: NEGATIVE mg/dL
Nitrite: POSITIVE — AB
Protein, ur: >=300 mg/dL — AB
Specific Gravity, Urine: 1.018 (ref 1.005–1.030)
WBC, UA: >50 WBC/hpf (ref 0–5)
pH: 8 (ref 5.0–8.0)

## 2023-10-16 LAB — URINE CULTURE

## 2023-10-17 ENCOUNTER — Non-Acute Institutional Stay (SKILLED_NURSING_FACILITY): Payer: Self-pay | Admitting: Adult Health

## 2023-10-17 ENCOUNTER — Other Ambulatory Visit: Payer: Self-pay | Admitting: Adult Health

## 2023-10-17 ENCOUNTER — Encounter: Payer: Self-pay | Admitting: Adult Health

## 2023-10-17 DIAGNOSIS — N183 Chronic kidney disease, stage 3 unspecified: Secondary | ICD-10-CM

## 2023-10-17 DIAGNOSIS — I129 Hypertensive chronic kidney disease with stage 1 through stage 4 chronic kidney disease, or unspecified chronic kidney disease: Secondary | ICD-10-CM

## 2023-10-17 DIAGNOSIS — N1831 Chronic kidney disease, stage 3a: Secondary | ICD-10-CM

## 2023-10-17 DIAGNOSIS — I700 Atherosclerosis of aorta: Secondary | ICD-10-CM | POA: Diagnosis not present

## 2023-10-17 DIAGNOSIS — S72002G Fracture of unspecified part of neck of left femur, subsequent encounter for closed fracture with delayed healing: Secondary | ICD-10-CM | POA: Diagnosis not present

## 2023-10-17 MED ORDER — ATORVASTATIN CALCIUM 20 MG PO TABS: 20.0000 mg | ORAL_TABLET | Freq: Every day | ORAL | 0 refills | Status: AC

## 2023-10-17 MED ORDER — AMLODIPINE BESYLATE 5 MG PO TABS: 5.0000 mg | ORAL_TABLET | Freq: Every day | ORAL | 0 refills | Status: AC

## 2023-10-17 MED ORDER — METHOCARBAMOL 500 MG PO TABS: 500.0000 mg | ORAL_TABLET | Freq: Four times a day (QID) | ORAL | 0 refills | Status: AC | PRN

## 2023-10-17 MED ORDER — AMOXICILLIN-POT CLAVULANATE 875-125 MG PO TABS: 1.0000 | ORAL_TABLET | Freq: Two times a day (BID) | ORAL | 0 refills | Status: AC

## 2023-10-17 MED ORDER — AZITHROMYCIN 250 MG PO TABS: 250.0000 mg | ORAL_TABLET | Freq: Every day | ORAL | 0 refills | Status: AC

## 2023-10-17 NOTE — Progress Notes (Signed)
 Location:  [facility] Room Number: 143 Place of Service:  SNF ([id number])   CODE STATUS: dnr   No Known Allergies  Chief Complaint  Patient presents with   Discharge Note    HPI:  He is being discharged to home with home health for pt/ot. He will need a wheelchair. He will need his prescriptions written and will need to follow up with his medical provider. He had been hospitalized for an unwitnessed fall and suffered a left subcapital hip fracture. He underwent a left hemiarthroplasty on 09-22-23.he was admitted to this facility for short term rehab: therapy: ambulate 50 feet with rolling walker and moderate assist; upper body min assist lower body mod assist; brp: mod assist.   Past Medical History:  Diagnosis Date   Carotid artery occlusion    COPD (chronic obstructive pulmonary disease) (HCC)    Hypertension    Peripheral vascular disease    Stroke [facility])     Past Surgical History:  Procedure Laterality Date   HIP ARTHROPLASTY Left 09/22/2023   Procedure: HEMIARTHROPLASTY (BIPOLAR) HIP, POSTERIOR APPROACH FOR FRACTURE;  Surgeon: Margrette Taft BRAVO, MD;  Location: [facility];  Service: Orthopedics;  Laterality: Left;  DIRECT LATERAL APPROACH    Social History   Socioeconomic History   Marital status: Married    Spouse name: Not on file   Number of children: Not on file   Years of education: Not on file   Highest education level: Not on file  Occupational History   Not on file  Tobacco Use   Smoking status: Every Day    Current packs/day: 1.00    Average packs/day: 1 pack/day for 60.0 years (60.0 ttl pk-yrs)    Types: Cigarettes   Smokeless tobacco: Never  Vaping Use   Vaping status: Never Used  Substance and Sexual Activity   Alcohol use: No    Alcohol/week: 0.0 standard drinks of alcohol   Drug use: No   Sexual activity: Not on file  Other Topics Concern   Not on file  Social History Narrative   Not on file   Social Drivers of Health    Financial Resource Strain: Not on file  Food Insecurity: Unknown (09/21/2023)   Hunger Vital Sign    Worried About Running Out of Food in the Last Year: Not on file    Ran Out of Food in the Last Year: Patient unable to answer  Transportation Needs: Patient Unable To Answer (09/21/2023)   PRAPARE - Transportation    Lack of Transportation (Medical): Patient unable to answer    Lack of Transportation (Non-Medical): Patient unable to answer  Physical Activity: Not on file  Stress: Not on file  Social Connections: Moderately Integrated (09/21/2023)   Social Connection and Isolation Panel    Frequency of Communication with Friends and Family: Never    Frequency of Social Gatherings with Friends and Family: More than three times a week    Attends Religious Services: 1 to 4 times per year    Active Member of [employer] or Organizations: No    Attends Banker Meetings: Never    Marital Status: Married  Catering manager Violence: Patient Unable To Answer (09/21/2023)   Humiliation, Afraid, Rape, and Kick questionnaire    Fear of Current or Ex-Partner: Patient unable to answer    Emotionally Abused: Patient unable to answer    Physically Abused: Patient unable to answer    Sexually Abused: Patient unable to answer   No  family history on file.    VITAL SIGNS BP 118/74   Pulse 82   Temp 97.6 F (36.4 C)   Resp 18   Ht 5' 11 (1.803 m)   Wt 139 lb 6.4 oz (63.2 kg)   SpO2 97%   BMI 19.44 kg/m   Outpatient Encounter Medications as of 10/17/2023  Medication Sig   amLODipine  (NORVASC ) 5 MG tablet Take 1 tablet (5 mg total) by mouth daily.   amoxicillin -clavulanate (AUGMENTIN ) 875-125 MG tablet Take 1 tablet by mouth every 12 (twelve) hours.   aspirin  81 MG tablet Take 1 tablet (81 mg total) by mouth daily with breakfast.   atorvastatin  (LIPITOR) 20 MG tablet Take 20 mg by mouth daily.   azithromycin  (ZITHROMAX  Z-PAK) 250 MG tablet Take 1 tablet (250 mg total) by mouth daily.  500mg  PO day 1, then 250mg  PO days 205   bisacodyl  (DULCOLAX) 5 MG EC tablet Take 1 tablet (5 mg total) by mouth daily as needed for moderate constipation.   cholecalciferol (VITAMIN D3) 25 MCG (1000 UNIT) tablet Take 2,000 Units by mouth daily.   docusate sodium  (COLACE) 100 MG capsule Take 1 capsule (100 mg total) by mouth 2 (two) times daily. (Patient not taking: Reported on 09/26/2023)   HYDROcodone -acetaminophen  (NORCO/VICODIN) 5-325 MG tablet Take 1-2 tablets by mouth every 4 (four) hours as needed for moderate pain (pain score 4-6) or severe pain (pain score 7-10). (Patient taking differently: Take 1-2 tablets by mouth every 6 (six) hours as needed for moderate pain (pain score 4-6) or severe pain (pain score 7-10).)   ketoconazole (NIZORAL) 2 % cream Apply 1 Application topically 2 (two) times daily. (Patient not taking: Reported on 09/26/2023)   methocarbamol  (ROBAXIN ) 500 MG tablet Take 1 tablet (500 mg total) by mouth every 6 (six) hours as needed for muscle spasms.   nicotine  (NICODERM CQ  - DOSED IN MG/24 HOURS) 21 mg/24hr patch Place 1 patch (21 mg total) onto the skin daily.   senna (SENOKOT) 8.6 MG tablet Take 1 tablet by mouth 2 (two) times daily.   No facility-administered encounter medications on file as of 10/17/2023.     SIGNIFICANT DIAGNOSTIC EXAMS  LABS  09-20-23: wbc 6.9; hgb 14.4; hct 42.6; mcv 100.9; plt 258; glucose 105; bun 25; creat 1.65; k+ 3.7; an++ 139; ca 9.1; gfr 41; protein 6.6 albumin 2.8; chol 168; ldl 121; trig 92 hdl 29 09-21-23: wbc 8.8; hgb 15.8; hct 46.9; mcv 99.4 plt 294 ;glucose 122; bun 20; creat 1.69; k+ 4.1; na++ 138; ca 9.5 gfr 40 09-24-23: wbc 9.9; hgb 13.0; hct 40.0; mcv 102.8 plt 203; glucose 91; bun 19; creat 1.40; k+ 4.2; na++ 135; ca 8.1 gfr 50; mag 2.0   Review of Systems  Reason unable to perform ROS: unable to fully participate.   Physical Exam Constitutional:      General: He is not in acute distress.    Appearance: He is well-developed. He  is not diaphoretic.  Neck:     Thyroid: No thyromegaly.  Cardiovascular:     Rate and Rhythm: Normal rate and regular rhythm.     Pulses: Normal pulses.     Heart sounds: Murmur heard.  Pulmonary:     Effort: Pulmonary effort is normal. No respiratory distress.     Breath sounds: Normal breath sounds.  Abdominal:     General: Bowel sounds are normal. There is no distension.     Palpations: Abdomen is soft.     Tenderness: There is  no abdominal tenderness.  Musculoskeletal:     Cervical back: Neck supple.     Right lower leg: No edema.     Left lower leg: No edema.     Comments:  left hemiarthroplasty on 09-22-23.   Lymphadenopathy:     Cervical: No cervical adenopathy.  Skin:    General: Skin is warm and dry.  Neurological:     Mental Status: He is alert. Mental status is at baseline.  Psychiatric:        Mood and Affect: Mood normal.      ASSESSMENT/ PLAN:   Patient is being discharged with the following home health services:  pt/ot to evaluate and treat as indicated for gait balance strength adl training.   Patient is being discharged with the following durable medical equipment:  wheelchair   Patient has been advised to f/u with their PCP in 1-2 weeks to for a transitions of care visit.  Social services at their facility was responsible for arranging this appointment.  Pt was provided with adequate prescriptions of noncontrolled medications to reach the scheduled appointment .  For controlled substances, a limited supply was provided as appropriate for the individual patient.  If the pt normally receives these medications from a pain clinic or has a contract with another physician, these medications should be received from that clinic or physician only).    A 30 day supply of his prescription medications for 30 days to [facility] by pill pack  Time spent with patient 40 minutes: therapy; medications; dme.   Barnie Seip NP [facility]   call  [phone]

## 2023-10-19 ENCOUNTER — Other Ambulatory Visit: Payer: Self-pay | Admitting: *Deleted

## 2023-10-19 NOTE — Patient Outreach (Signed)
 [facility] SNF waiver utilized for admission to [facility].  Verified with [facility] social worker. Mr. Froman transitioned home on yesterday 10/18/23 with [facility].  No identifiable VBCI complex care management needs.   Pablo Hurst, MSN, RN, BSN [place]  [facility] RN Post- Acute Care Manager Direct Dial: [phone]

## 2023-10-20 DIAGNOSIS — I739 Peripheral vascular disease, unspecified: Secondary | ICD-10-CM | POA: Diagnosis not present

## 2023-10-20 DIAGNOSIS — I10 Essential (primary) hypertension: Secondary | ICD-10-CM | POA: Diagnosis not present

## 2023-10-20 DIAGNOSIS — G311 Senile degeneration of brain, not elsewhere classified: Secondary | ICD-10-CM | POA: Diagnosis not present

## 2023-10-20 DIAGNOSIS — E782 Mixed hyperlipidemia: Secondary | ICD-10-CM | POA: Diagnosis not present

## 2023-10-20 DIAGNOSIS — N1831 Chronic kidney disease, stage 3a: Secondary | ICD-10-CM | POA: Diagnosis not present

## 2023-10-20 DIAGNOSIS — R6510 Systemic inflammatory response syndrome (SIRS) of non-infectious origin without acute organ dysfunction: Secondary | ICD-10-CM | POA: Diagnosis not present

## 2023-10-20 DIAGNOSIS — Z8673 Personal history of transient ischemic attack (TIA), and cerebral infarction without residual deficits: Secondary | ICD-10-CM | POA: Diagnosis not present

## 2023-10-20 DIAGNOSIS — J449 Chronic obstructive pulmonary disease, unspecified: Secondary | ICD-10-CM | POA: Diagnosis not present

## 2023-10-20 DIAGNOSIS — E440 Moderate protein-calorie malnutrition: Secondary | ICD-10-CM | POA: Diagnosis not present

## 2023-10-20 DIAGNOSIS — Z9181 History of falling: Secondary | ICD-10-CM | POA: Diagnosis not present

## 2023-10-20 DIAGNOSIS — I700 Atherosclerosis of aorta: Secondary | ICD-10-CM | POA: Diagnosis not present

## 2023-10-22 ENCOUNTER — Encounter: Admitting: Orthopedic Surgery

## 2023-10-23 DIAGNOSIS — E440 Moderate protein-calorie malnutrition: Secondary | ICD-10-CM | POA: Diagnosis not present

## 2023-10-23 DIAGNOSIS — R6510 Systemic inflammatory response syndrome (SIRS) of non-infectious origin without acute organ dysfunction: Secondary | ICD-10-CM | POA: Diagnosis not present

## 2023-10-23 DIAGNOSIS — I10 Essential (primary) hypertension: Secondary | ICD-10-CM | POA: Diagnosis not present

## 2023-10-23 DIAGNOSIS — N1831 Chronic kidney disease, stage 3a: Secondary | ICD-10-CM | POA: Diagnosis not present

## 2023-10-23 DIAGNOSIS — G311 Senile degeneration of brain, not elsewhere classified: Secondary | ICD-10-CM | POA: Diagnosis not present

## 2023-10-23 DIAGNOSIS — I700 Atherosclerosis of aorta: Secondary | ICD-10-CM | POA: Diagnosis not present

## 2023-10-24 DIAGNOSIS — G311 Senile degeneration of brain, not elsewhere classified: Secondary | ICD-10-CM | POA: Diagnosis not present

## 2023-10-24 DIAGNOSIS — I10 Essential (primary) hypertension: Secondary | ICD-10-CM | POA: Diagnosis not present

## 2023-10-24 DIAGNOSIS — I700 Atherosclerosis of aorta: Secondary | ICD-10-CM | POA: Diagnosis not present

## 2023-10-24 DIAGNOSIS — Z8673 Personal history of transient ischemic attack (TIA), and cerebral infarction without residual deficits: Secondary | ICD-10-CM | POA: Diagnosis not present

## 2023-10-24 DIAGNOSIS — E782 Mixed hyperlipidemia: Secondary | ICD-10-CM | POA: Diagnosis not present

## 2023-10-24 DIAGNOSIS — I739 Peripheral vascular disease, unspecified: Secondary | ICD-10-CM | POA: Diagnosis not present

## 2023-10-24 DIAGNOSIS — J449 Chronic obstructive pulmonary disease, unspecified: Secondary | ICD-10-CM | POA: Diagnosis not present

## 2023-10-24 DIAGNOSIS — E440 Moderate protein-calorie malnutrition: Secondary | ICD-10-CM | POA: Diagnosis not present

## 2023-10-24 DIAGNOSIS — N1831 Chronic kidney disease, stage 3a: Secondary | ICD-10-CM | POA: Diagnosis not present

## 2023-10-24 DIAGNOSIS — Z9181 History of falling: Secondary | ICD-10-CM | POA: Diagnosis not present

## 2023-10-24 DIAGNOSIS — R6510 Systemic inflammatory response syndrome (SIRS) of non-infectious origin without acute organ dysfunction: Secondary | ICD-10-CM | POA: Diagnosis not present

## 2023-10-25 ENCOUNTER — Encounter: Admitting: Orthopedic Surgery

## 2023-10-29 DIAGNOSIS — E440 Moderate protein-calorie malnutrition: Secondary | ICD-10-CM | POA: Diagnosis not present

## 2023-10-29 DIAGNOSIS — R6510 Systemic inflammatory response syndrome (SIRS) of non-infectious origin without acute organ dysfunction: Secondary | ICD-10-CM | POA: Diagnosis not present

## 2023-10-29 DIAGNOSIS — I10 Essential (primary) hypertension: Secondary | ICD-10-CM | POA: Diagnosis not present

## 2023-10-29 DIAGNOSIS — I700 Atherosclerosis of aorta: Secondary | ICD-10-CM | POA: Diagnosis not present

## 2023-10-29 DIAGNOSIS — N1831 Chronic kidney disease, stage 3a: Secondary | ICD-10-CM | POA: Diagnosis not present

## 2023-10-29 DIAGNOSIS — G311 Senile degeneration of brain, not elsewhere classified: Secondary | ICD-10-CM | POA: Diagnosis not present

## 2023-10-31 DIAGNOSIS — I700 Atherosclerosis of aorta: Secondary | ICD-10-CM | POA: Diagnosis not present

## 2023-10-31 DIAGNOSIS — G311 Senile degeneration of brain, not elsewhere classified: Secondary | ICD-10-CM | POA: Diagnosis not present

## 2023-10-31 DIAGNOSIS — E440 Moderate protein-calorie malnutrition: Secondary | ICD-10-CM | POA: Diagnosis not present

## 2023-10-31 DIAGNOSIS — R6510 Systemic inflammatory response syndrome (SIRS) of non-infectious origin without acute organ dysfunction: Secondary | ICD-10-CM | POA: Diagnosis not present

## 2023-10-31 DIAGNOSIS — I10 Essential (primary) hypertension: Secondary | ICD-10-CM | POA: Diagnosis not present

## 2023-10-31 DIAGNOSIS — N1831 Chronic kidney disease, stage 3a: Secondary | ICD-10-CM | POA: Diagnosis not present

## 2023-11-02 DIAGNOSIS — R6510 Systemic inflammatory response syndrome (SIRS) of non-infectious origin without acute organ dysfunction: Secondary | ICD-10-CM | POA: Diagnosis not present

## 2023-11-02 DIAGNOSIS — E440 Moderate protein-calorie malnutrition: Secondary | ICD-10-CM | POA: Diagnosis not present

## 2023-11-02 DIAGNOSIS — N1831 Chronic kidney disease, stage 3a: Secondary | ICD-10-CM | POA: Diagnosis not present

## 2023-11-02 DIAGNOSIS — I10 Essential (primary) hypertension: Secondary | ICD-10-CM | POA: Diagnosis not present

## 2023-11-02 DIAGNOSIS — I700 Atherosclerosis of aorta: Secondary | ICD-10-CM | POA: Diagnosis not present

## 2023-11-02 DIAGNOSIS — G311 Senile degeneration of brain, not elsewhere classified: Secondary | ICD-10-CM | POA: Diagnosis not present

## 2023-11-05 DIAGNOSIS — I700 Atherosclerosis of aorta: Secondary | ICD-10-CM | POA: Diagnosis not present

## 2023-11-05 DIAGNOSIS — N1831 Chronic kidney disease, stage 3a: Secondary | ICD-10-CM | POA: Diagnosis not present

## 2023-11-05 DIAGNOSIS — R6510 Systemic inflammatory response syndrome (SIRS) of non-infectious origin without acute organ dysfunction: Secondary | ICD-10-CM | POA: Diagnosis not present

## 2023-11-05 DIAGNOSIS — E440 Moderate protein-calorie malnutrition: Secondary | ICD-10-CM | POA: Diagnosis not present

## 2023-11-05 DIAGNOSIS — I10 Essential (primary) hypertension: Secondary | ICD-10-CM | POA: Diagnosis not present

## 2023-11-05 DIAGNOSIS — G311 Senile degeneration of brain, not elsewhere classified: Secondary | ICD-10-CM | POA: Diagnosis not present

## 2023-11-07 DIAGNOSIS — E440 Moderate protein-calorie malnutrition: Secondary | ICD-10-CM | POA: Diagnosis not present

## 2023-11-07 DIAGNOSIS — G311 Senile degeneration of brain, not elsewhere classified: Secondary | ICD-10-CM | POA: Diagnosis not present

## 2023-11-07 DIAGNOSIS — R6510 Systemic inflammatory response syndrome (SIRS) of non-infectious origin without acute organ dysfunction: Secondary | ICD-10-CM | POA: Diagnosis not present

## 2023-11-07 DIAGNOSIS — I700 Atherosclerosis of aorta: Secondary | ICD-10-CM | POA: Diagnosis not present

## 2023-11-07 DIAGNOSIS — I10 Essential (primary) hypertension: Secondary | ICD-10-CM | POA: Diagnosis not present

## 2023-11-07 DIAGNOSIS — N1831 Chronic kidney disease, stage 3a: Secondary | ICD-10-CM | POA: Diagnosis not present

## 2023-11-09 DIAGNOSIS — R6510 Systemic inflammatory response syndrome (SIRS) of non-infectious origin without acute organ dysfunction: Secondary | ICD-10-CM | POA: Diagnosis not present

## 2023-11-09 DIAGNOSIS — I10 Essential (primary) hypertension: Secondary | ICD-10-CM | POA: Diagnosis not present

## 2023-11-09 DIAGNOSIS — E440 Moderate protein-calorie malnutrition: Secondary | ICD-10-CM | POA: Diagnosis not present

## 2023-11-09 DIAGNOSIS — I700 Atherosclerosis of aorta: Secondary | ICD-10-CM | POA: Diagnosis not present

## 2023-11-09 DIAGNOSIS — N1831 Chronic kidney disease, stage 3a: Secondary | ICD-10-CM | POA: Diagnosis not present

## 2023-11-09 DIAGNOSIS — G311 Senile degeneration of brain, not elsewhere classified: Secondary | ICD-10-CM | POA: Diagnosis not present

## 2023-11-12 DIAGNOSIS — I700 Atherosclerosis of aorta: Secondary | ICD-10-CM | POA: Diagnosis not present

## 2023-11-12 DIAGNOSIS — G311 Senile degeneration of brain, not elsewhere classified: Secondary | ICD-10-CM | POA: Diagnosis not present

## 2023-11-12 DIAGNOSIS — R6510 Systemic inflammatory response syndrome (SIRS) of non-infectious origin without acute organ dysfunction: Secondary | ICD-10-CM | POA: Diagnosis not present

## 2023-11-12 DIAGNOSIS — I10 Essential (primary) hypertension: Secondary | ICD-10-CM | POA: Diagnosis not present

## 2023-11-12 DIAGNOSIS — E440 Moderate protein-calorie malnutrition: Secondary | ICD-10-CM | POA: Diagnosis not present

## 2023-11-12 DIAGNOSIS — N1831 Chronic kidney disease, stage 3a: Secondary | ICD-10-CM | POA: Diagnosis not present

## 2023-11-14 DIAGNOSIS — N1831 Chronic kidney disease, stage 3a: Secondary | ICD-10-CM | POA: Diagnosis not present

## 2023-11-14 DIAGNOSIS — R6510 Systemic inflammatory response syndrome (SIRS) of non-infectious origin without acute organ dysfunction: Secondary | ICD-10-CM | POA: Diagnosis not present

## 2023-11-14 DIAGNOSIS — I10 Essential (primary) hypertension: Secondary | ICD-10-CM | POA: Diagnosis not present

## 2023-11-14 DIAGNOSIS — I700 Atherosclerosis of aorta: Secondary | ICD-10-CM | POA: Diagnosis not present

## 2023-11-14 DIAGNOSIS — E440 Moderate protein-calorie malnutrition: Secondary | ICD-10-CM | POA: Diagnosis not present

## 2023-11-14 DIAGNOSIS — G311 Senile degeneration of brain, not elsewhere classified: Secondary | ICD-10-CM | POA: Diagnosis not present

## 2023-11-15 DIAGNOSIS — R6510 Systemic inflammatory response syndrome (SIRS) of non-infectious origin without acute organ dysfunction: Secondary | ICD-10-CM | POA: Diagnosis not present

## 2023-11-15 DIAGNOSIS — N1831 Chronic kidney disease, stage 3a: Secondary | ICD-10-CM | POA: Diagnosis not present

## 2023-11-15 DIAGNOSIS — I700 Atherosclerosis of aorta: Secondary | ICD-10-CM | POA: Diagnosis not present

## 2023-11-15 DIAGNOSIS — E440 Moderate protein-calorie malnutrition: Secondary | ICD-10-CM | POA: Diagnosis not present

## 2023-11-15 DIAGNOSIS — I10 Essential (primary) hypertension: Secondary | ICD-10-CM | POA: Diagnosis not present

## 2023-11-15 DIAGNOSIS — G311 Senile degeneration of brain, not elsewhere classified: Secondary | ICD-10-CM | POA: Diagnosis not present

## 2023-11-16 DIAGNOSIS — I10 Essential (primary) hypertension: Secondary | ICD-10-CM | POA: Diagnosis not present

## 2023-11-16 DIAGNOSIS — E440 Moderate protein-calorie malnutrition: Secondary | ICD-10-CM | POA: Diagnosis not present

## 2023-11-16 DIAGNOSIS — I700 Atherosclerosis of aorta: Secondary | ICD-10-CM | POA: Diagnosis not present

## 2023-11-16 DIAGNOSIS — N1831 Chronic kidney disease, stage 3a: Secondary | ICD-10-CM | POA: Diagnosis not present

## 2023-11-16 DIAGNOSIS — G311 Senile degeneration of brain, not elsewhere classified: Secondary | ICD-10-CM | POA: Diagnosis not present

## 2023-11-16 DIAGNOSIS — R6510 Systemic inflammatory response syndrome (SIRS) of non-infectious origin without acute organ dysfunction: Secondary | ICD-10-CM | POA: Diagnosis not present

## 2023-11-19 DIAGNOSIS — N1831 Chronic kidney disease, stage 3a: Secondary | ICD-10-CM | POA: Diagnosis not present

## 2023-11-19 DIAGNOSIS — G311 Senile degeneration of brain, not elsewhere classified: Secondary | ICD-10-CM | POA: Diagnosis not present

## 2023-11-19 DIAGNOSIS — I700 Atherosclerosis of aorta: Secondary | ICD-10-CM | POA: Diagnosis not present

## 2023-11-19 DIAGNOSIS — E440 Moderate protein-calorie malnutrition: Secondary | ICD-10-CM | POA: Diagnosis not present

## 2023-11-19 DIAGNOSIS — R6510 Systemic inflammatory response syndrome (SIRS) of non-infectious origin without acute organ dysfunction: Secondary | ICD-10-CM | POA: Diagnosis not present

## 2023-11-19 DIAGNOSIS — I10 Essential (primary) hypertension: Secondary | ICD-10-CM | POA: Diagnosis not present

## 2023-11-21 DIAGNOSIS — R6510 Systemic inflammatory response syndrome (SIRS) of non-infectious origin without acute organ dysfunction: Secondary | ICD-10-CM | POA: Diagnosis not present

## 2023-11-21 DIAGNOSIS — E440 Moderate protein-calorie malnutrition: Secondary | ICD-10-CM | POA: Diagnosis not present

## 2023-11-21 DIAGNOSIS — I700 Atherosclerosis of aorta: Secondary | ICD-10-CM | POA: Diagnosis not present

## 2023-11-21 DIAGNOSIS — G311 Senile degeneration of brain, not elsewhere classified: Secondary | ICD-10-CM | POA: Diagnosis not present

## 2023-11-21 DIAGNOSIS — N1831 Chronic kidney disease, stage 3a: Secondary | ICD-10-CM | POA: Diagnosis not present

## 2023-11-21 DIAGNOSIS — I10 Essential (primary) hypertension: Secondary | ICD-10-CM | POA: Diagnosis not present

## 2023-11-23 DIAGNOSIS — I700 Atherosclerosis of aorta: Secondary | ICD-10-CM | POA: Diagnosis not present

## 2023-11-23 DIAGNOSIS — R6510 Systemic inflammatory response syndrome (SIRS) of non-infectious origin without acute organ dysfunction: Secondary | ICD-10-CM | POA: Diagnosis not present

## 2023-11-23 DIAGNOSIS — E440 Moderate protein-calorie malnutrition: Secondary | ICD-10-CM | POA: Diagnosis not present

## 2023-11-23 DIAGNOSIS — I10 Essential (primary) hypertension: Secondary | ICD-10-CM | POA: Diagnosis not present

## 2023-11-23 DIAGNOSIS — G311 Senile degeneration of brain, not elsewhere classified: Secondary | ICD-10-CM | POA: Diagnosis not present

## 2023-11-23 DIAGNOSIS — N1831 Chronic kidney disease, stage 3a: Secondary | ICD-10-CM | POA: Diagnosis not present

## 2023-11-24 DIAGNOSIS — I700 Atherosclerosis of aorta: Secondary | ICD-10-CM | POA: Diagnosis not present

## 2023-11-24 DIAGNOSIS — Z9181 History of falling: Secondary | ICD-10-CM | POA: Diagnosis not present

## 2023-11-24 DIAGNOSIS — Z8673 Personal history of transient ischemic attack (TIA), and cerebral infarction without residual deficits: Secondary | ICD-10-CM | POA: Diagnosis not present

## 2023-11-24 DIAGNOSIS — R6510 Systemic inflammatory response syndrome (SIRS) of non-infectious origin without acute organ dysfunction: Secondary | ICD-10-CM | POA: Diagnosis not present

## 2023-11-24 DIAGNOSIS — E440 Moderate protein-calorie malnutrition: Secondary | ICD-10-CM | POA: Diagnosis not present

## 2023-11-24 DIAGNOSIS — I739 Peripheral vascular disease, unspecified: Secondary | ICD-10-CM | POA: Diagnosis not present

## 2023-11-24 DIAGNOSIS — J449 Chronic obstructive pulmonary disease, unspecified: Secondary | ICD-10-CM | POA: Diagnosis not present

## 2023-11-24 DIAGNOSIS — N1831 Chronic kidney disease, stage 3a: Secondary | ICD-10-CM | POA: Diagnosis not present

## 2023-11-24 DIAGNOSIS — I10 Essential (primary) hypertension: Secondary | ICD-10-CM | POA: Diagnosis not present

## 2023-11-24 DIAGNOSIS — E782 Mixed hyperlipidemia: Secondary | ICD-10-CM | POA: Diagnosis not present

## 2023-11-24 DIAGNOSIS — G311 Senile degeneration of brain, not elsewhere classified: Secondary | ICD-10-CM | POA: Diagnosis not present

## 2023-11-26 DIAGNOSIS — I10 Essential (primary) hypertension: Secondary | ICD-10-CM | POA: Diagnosis not present

## 2023-11-26 DIAGNOSIS — E440 Moderate protein-calorie malnutrition: Secondary | ICD-10-CM | POA: Diagnosis not present

## 2023-11-26 DIAGNOSIS — G311 Senile degeneration of brain, not elsewhere classified: Secondary | ICD-10-CM | POA: Diagnosis not present

## 2023-11-26 DIAGNOSIS — N1831 Chronic kidney disease, stage 3a: Secondary | ICD-10-CM | POA: Diagnosis not present

## 2023-11-26 DIAGNOSIS — I700 Atherosclerosis of aorta: Secondary | ICD-10-CM | POA: Diagnosis not present

## 2023-11-26 DIAGNOSIS — R6510 Systemic inflammatory response syndrome (SIRS) of non-infectious origin without acute organ dysfunction: Secondary | ICD-10-CM | POA: Diagnosis not present

## 2023-11-28 DIAGNOSIS — G311 Senile degeneration of brain, not elsewhere classified: Secondary | ICD-10-CM | POA: Diagnosis not present

## 2023-11-28 DIAGNOSIS — I10 Essential (primary) hypertension: Secondary | ICD-10-CM | POA: Diagnosis not present

## 2023-11-28 DIAGNOSIS — N1831 Chronic kidney disease, stage 3a: Secondary | ICD-10-CM | POA: Diagnosis not present

## 2023-11-28 DIAGNOSIS — I700 Atherosclerosis of aorta: Secondary | ICD-10-CM | POA: Diagnosis not present

## 2023-11-28 DIAGNOSIS — E440 Moderate protein-calorie malnutrition: Secondary | ICD-10-CM | POA: Diagnosis not present

## 2023-11-28 DIAGNOSIS — R6510 Systemic inflammatory response syndrome (SIRS) of non-infectious origin without acute organ dysfunction: Secondary | ICD-10-CM | POA: Diagnosis not present

## 2023-11-30 DIAGNOSIS — G311 Senile degeneration of brain, not elsewhere classified: Secondary | ICD-10-CM | POA: Diagnosis not present

## 2023-11-30 DIAGNOSIS — I10 Essential (primary) hypertension: Secondary | ICD-10-CM | POA: Diagnosis not present

## 2023-11-30 DIAGNOSIS — N1831 Chronic kidney disease, stage 3a: Secondary | ICD-10-CM | POA: Diagnosis not present

## 2023-11-30 DIAGNOSIS — E440 Moderate protein-calorie malnutrition: Secondary | ICD-10-CM | POA: Diagnosis not present

## 2023-11-30 DIAGNOSIS — R6510 Systemic inflammatory response syndrome (SIRS) of non-infectious origin without acute organ dysfunction: Secondary | ICD-10-CM | POA: Diagnosis not present

## 2023-11-30 DIAGNOSIS — I700 Atherosclerosis of aorta: Secondary | ICD-10-CM | POA: Diagnosis not present

## 2023-12-03 DIAGNOSIS — I700 Atherosclerosis of aorta: Secondary | ICD-10-CM | POA: Diagnosis not present

## 2023-12-03 DIAGNOSIS — R6510 Systemic inflammatory response syndrome (SIRS) of non-infectious origin without acute organ dysfunction: Secondary | ICD-10-CM | POA: Diagnosis not present

## 2023-12-03 DIAGNOSIS — I10 Essential (primary) hypertension: Secondary | ICD-10-CM | POA: Diagnosis not present

## 2023-12-03 DIAGNOSIS — N1831 Chronic kidney disease, stage 3a: Secondary | ICD-10-CM | POA: Diagnosis not present

## 2023-12-03 DIAGNOSIS — G311 Senile degeneration of brain, not elsewhere classified: Secondary | ICD-10-CM | POA: Diagnosis not present

## 2023-12-03 DIAGNOSIS — E440 Moderate protein-calorie malnutrition: Secondary | ICD-10-CM | POA: Diagnosis not present

## 2023-12-04 DIAGNOSIS — R6510 Systemic inflammatory response syndrome (SIRS) of non-infectious origin without acute organ dysfunction: Secondary | ICD-10-CM | POA: Diagnosis not present

## 2023-12-04 DIAGNOSIS — N1831 Chronic kidney disease, stage 3a: Secondary | ICD-10-CM | POA: Diagnosis not present

## 2023-12-04 DIAGNOSIS — I700 Atherosclerosis of aorta: Secondary | ICD-10-CM | POA: Diagnosis not present

## 2023-12-04 DIAGNOSIS — I10 Essential (primary) hypertension: Secondary | ICD-10-CM | POA: Diagnosis not present

## 2023-12-04 DIAGNOSIS — E440 Moderate protein-calorie malnutrition: Secondary | ICD-10-CM | POA: Diagnosis not present

## 2023-12-04 DIAGNOSIS — G311 Senile degeneration of brain, not elsewhere classified: Secondary | ICD-10-CM | POA: Diagnosis not present

## 2023-12-05 DIAGNOSIS — G311 Senile degeneration of brain, not elsewhere classified: Secondary | ICD-10-CM | POA: Diagnosis not present

## 2023-12-05 DIAGNOSIS — I10 Essential (primary) hypertension: Secondary | ICD-10-CM | POA: Diagnosis not present

## 2023-12-05 DIAGNOSIS — N1831 Chronic kidney disease, stage 3a: Secondary | ICD-10-CM | POA: Diagnosis not present

## 2023-12-05 DIAGNOSIS — R6510 Systemic inflammatory response syndrome (SIRS) of non-infectious origin without acute organ dysfunction: Secondary | ICD-10-CM | POA: Diagnosis not present

## 2023-12-05 DIAGNOSIS — E440 Moderate protein-calorie malnutrition: Secondary | ICD-10-CM | POA: Diagnosis not present

## 2023-12-05 DIAGNOSIS — I700 Atherosclerosis of aorta: Secondary | ICD-10-CM | POA: Diagnosis not present

## 2023-12-07 DIAGNOSIS — G311 Senile degeneration of brain, not elsewhere classified: Secondary | ICD-10-CM | POA: Diagnosis not present

## 2023-12-07 DIAGNOSIS — E440 Moderate protein-calorie malnutrition: Secondary | ICD-10-CM | POA: Diagnosis not present

## 2023-12-07 DIAGNOSIS — R6510 Systemic inflammatory response syndrome (SIRS) of non-infectious origin without acute organ dysfunction: Secondary | ICD-10-CM | POA: Diagnosis not present

## 2023-12-07 DIAGNOSIS — I700 Atherosclerosis of aorta: Secondary | ICD-10-CM | POA: Diagnosis not present

## 2023-12-07 DIAGNOSIS — N1831 Chronic kidney disease, stage 3a: Secondary | ICD-10-CM | POA: Diagnosis not present

## 2023-12-07 DIAGNOSIS — I10 Essential (primary) hypertension: Secondary | ICD-10-CM | POA: Diagnosis not present

## 2023-12-10 DIAGNOSIS — E440 Moderate protein-calorie malnutrition: Secondary | ICD-10-CM | POA: Diagnosis not present

## 2023-12-10 DIAGNOSIS — N1831 Chronic kidney disease, stage 3a: Secondary | ICD-10-CM | POA: Diagnosis not present

## 2023-12-10 DIAGNOSIS — I10 Essential (primary) hypertension: Secondary | ICD-10-CM | POA: Diagnosis not present

## 2023-12-10 DIAGNOSIS — G311 Senile degeneration of brain, not elsewhere classified: Secondary | ICD-10-CM | POA: Diagnosis not present

## 2023-12-10 DIAGNOSIS — R6510 Systemic inflammatory response syndrome (SIRS) of non-infectious origin without acute organ dysfunction: Secondary | ICD-10-CM | POA: Diagnosis not present

## 2023-12-10 DIAGNOSIS — I700 Atherosclerosis of aorta: Secondary | ICD-10-CM | POA: Diagnosis not present

## 2023-12-12 DIAGNOSIS — I700 Atherosclerosis of aorta: Secondary | ICD-10-CM | POA: Diagnosis not present

## 2023-12-12 DIAGNOSIS — N1831 Chronic kidney disease, stage 3a: Secondary | ICD-10-CM | POA: Diagnosis not present

## 2023-12-12 DIAGNOSIS — R6510 Systemic inflammatory response syndrome (SIRS) of non-infectious origin without acute organ dysfunction: Secondary | ICD-10-CM | POA: Diagnosis not present

## 2023-12-12 DIAGNOSIS — E440 Moderate protein-calorie malnutrition: Secondary | ICD-10-CM | POA: Diagnosis not present

## 2023-12-12 DIAGNOSIS — I10 Essential (primary) hypertension: Secondary | ICD-10-CM | POA: Diagnosis not present

## 2023-12-12 DIAGNOSIS — G311 Senile degeneration of brain, not elsewhere classified: Secondary | ICD-10-CM | POA: Diagnosis not present

## 2023-12-14 DIAGNOSIS — I10 Essential (primary) hypertension: Secondary | ICD-10-CM | POA: Diagnosis not present

## 2023-12-14 DIAGNOSIS — I700 Atherosclerosis of aorta: Secondary | ICD-10-CM | POA: Diagnosis not present

## 2023-12-14 DIAGNOSIS — R6510 Systemic inflammatory response syndrome (SIRS) of non-infectious origin without acute organ dysfunction: Secondary | ICD-10-CM | POA: Diagnosis not present

## 2023-12-14 DIAGNOSIS — N1831 Chronic kidney disease, stage 3a: Secondary | ICD-10-CM | POA: Diagnosis not present

## 2023-12-14 DIAGNOSIS — G311 Senile degeneration of brain, not elsewhere classified: Secondary | ICD-10-CM | POA: Diagnosis not present

## 2023-12-14 DIAGNOSIS — E440 Moderate protein-calorie malnutrition: Secondary | ICD-10-CM | POA: Diagnosis not present

## 2023-12-15 DIAGNOSIS — N1831 Chronic kidney disease, stage 3a: Secondary | ICD-10-CM | POA: Diagnosis not present

## 2023-12-15 DIAGNOSIS — G311 Senile degeneration of brain, not elsewhere classified: Secondary | ICD-10-CM | POA: Diagnosis not present

## 2023-12-15 DIAGNOSIS — I10 Essential (primary) hypertension: Secondary | ICD-10-CM | POA: Diagnosis not present

## 2023-12-15 DIAGNOSIS — E440 Moderate protein-calorie malnutrition: Secondary | ICD-10-CM | POA: Diagnosis not present

## 2023-12-15 DIAGNOSIS — I700 Atherosclerosis of aorta: Secondary | ICD-10-CM | POA: Diagnosis not present

## 2023-12-15 DIAGNOSIS — R6510 Systemic inflammatory response syndrome (SIRS) of non-infectious origin without acute organ dysfunction: Secondary | ICD-10-CM | POA: Diagnosis not present

## 2023-12-24 DEATH — deceased
